# Patient Record
Sex: Female | Born: 1960 | Race: White | Hispanic: No | Smoking: Current every day smoker
Health system: Southern US, Community
[De-identification: ages and names within clinical notes are randomized; demographics above are authoritative.]

## PROBLEM LIST (undated history)

## (undated) DIAGNOSIS — K219 Gastro-esophageal reflux disease without esophagitis: Secondary | ICD-10-CM

## (undated) DIAGNOSIS — C3490 Malignant neoplasm of unspecified part of unspecified bronchus or lung: Secondary | ICD-10-CM

## (undated) DIAGNOSIS — F329 Major depressive disorder, single episode, unspecified: Secondary | ICD-10-CM

## (undated) DIAGNOSIS — D229 Melanocytic nevi, unspecified: Secondary | ICD-10-CM

## (undated) DIAGNOSIS — T8859XA Other complications of anesthesia, initial encounter: Secondary | ICD-10-CM

## (undated) DIAGNOSIS — E785 Hyperlipidemia, unspecified: Secondary | ICD-10-CM

## (undated) DIAGNOSIS — M1990 Unspecified osteoarthritis, unspecified site: Secondary | ICD-10-CM

## (undated) DIAGNOSIS — R112 Nausea with vomiting, unspecified: Secondary | ICD-10-CM

## (undated) DIAGNOSIS — I10 Essential (primary) hypertension: Secondary | ICD-10-CM

## (undated) DIAGNOSIS — T4145XA Adverse effect of unspecified anesthetic, initial encounter: Secondary | ICD-10-CM

## (undated) DIAGNOSIS — Z9889 Other specified postprocedural states: Secondary | ICD-10-CM

## (undated) DIAGNOSIS — K625 Hemorrhage of anus and rectum: Secondary | ICD-10-CM

## (undated) DIAGNOSIS — F40298 Other specified phobia: Secondary | ICD-10-CM

## (undated) DIAGNOSIS — F32A Depression, unspecified: Secondary | ICD-10-CM

## (undated) DIAGNOSIS — Q786 Multiple congenital exostoses: Secondary | ICD-10-CM

## (undated) HISTORY — DX: Multiple congenital exostoses: Q78.6

## (undated) HISTORY — DX: Gastro-esophageal reflux disease without esophagitis: K21.9

## (undated) HISTORY — PX: OTHER SURGICAL HISTORY: SHX169

## (undated) HISTORY — DX: Melanocytic nevi, unspecified: D22.9

## (undated) HISTORY — PX: PORTA CATH INSERTION: CATH118285

## (undated) HISTORY — DX: Hyperlipidemia, unspecified: E78.5

## (undated) HISTORY — PX: CHOLECYSTECTOMY: SHX55

## (undated) HISTORY — DX: Depression, unspecified: F32.A

## (undated) HISTORY — DX: Major depressive disorder, single episode, unspecified: F32.9

## (undated) HISTORY — DX: Hemorrhage of anus and rectum: K62.5

## (undated) HISTORY — DX: Essential (primary) hypertension: I10

## (undated) HISTORY — DX: Other specified phobia: F40.298

## (undated) HISTORY — DX: Malignant neoplasm of unspecified part of unspecified bronchus or lung: C34.90

---

## 1998-11-03 ENCOUNTER — Emergency Department (HOSPITAL_COMMUNITY): Admission: EM | Admit: 1998-11-03 | Discharge: 1998-11-03 | Payer: Self-pay | Admitting: Emergency Medicine

## 1998-11-17 ENCOUNTER — Encounter: Payer: Self-pay | Admitting: Emergency Medicine

## 1998-11-17 ENCOUNTER — Emergency Department (HOSPITAL_COMMUNITY): Admission: EM | Admit: 1998-11-17 | Discharge: 1998-11-17 | Payer: Self-pay | Admitting: Emergency Medicine

## 1998-12-31 ENCOUNTER — Emergency Department (HOSPITAL_COMMUNITY): Admission: EM | Admit: 1998-12-31 | Discharge: 1998-12-31 | Payer: Self-pay | Admitting: Emergency Medicine

## 2000-10-07 ENCOUNTER — Emergency Department (HOSPITAL_COMMUNITY): Admission: EM | Admit: 2000-10-07 | Discharge: 2000-10-07 | Payer: Self-pay | Admitting: Emergency Medicine

## 2000-11-24 ENCOUNTER — Ambulatory Visit (HOSPITAL_COMMUNITY): Admission: RE | Admit: 2000-11-24 | Discharge: 2000-11-24 | Payer: Self-pay | Admitting: Family Medicine

## 2000-11-24 ENCOUNTER — Encounter: Payer: Self-pay | Admitting: Family Medicine

## 2002-02-04 ENCOUNTER — Ambulatory Visit (HOSPITAL_COMMUNITY): Admission: RE | Admit: 2002-02-04 | Discharge: 2002-02-04 | Payer: Self-pay | Admitting: Family Medicine

## 2002-02-22 ENCOUNTER — Encounter: Payer: Self-pay | Admitting: Family Medicine

## 2002-02-22 ENCOUNTER — Encounter: Admission: RE | Admit: 2002-02-22 | Discharge: 2002-02-22 | Payer: Self-pay | Admitting: Family Medicine

## 2002-09-16 ENCOUNTER — Emergency Department (HOSPITAL_COMMUNITY): Admission: EM | Admit: 2002-09-16 | Discharge: 2002-09-16 | Payer: Self-pay | Admitting: Emergency Medicine

## 2002-10-20 ENCOUNTER — Encounter: Payer: Self-pay | Admitting: Internal Medicine

## 2002-10-20 ENCOUNTER — Ambulatory Visit (HOSPITAL_COMMUNITY): Admission: RE | Admit: 2002-10-20 | Discharge: 2002-10-20 | Payer: Self-pay | Admitting: Internal Medicine

## 2007-03-09 ENCOUNTER — Emergency Department (HOSPITAL_COMMUNITY): Admission: EM | Admit: 2007-03-09 | Discharge: 2007-03-09 | Payer: Self-pay | Admitting: Emergency Medicine

## 2008-04-21 ENCOUNTER — Ambulatory Visit: Payer: Self-pay | Admitting: Family Medicine

## 2008-04-27 ENCOUNTER — Ambulatory Visit: Payer: Self-pay | Admitting: *Deleted

## 2008-05-18 ENCOUNTER — Ambulatory Visit: Payer: Self-pay | Admitting: Family Medicine

## 2008-05-18 LAB — CONVERTED CEMR LAB
Albumin: 4.9 g/dL (ref 3.5–5.2)
Amphetamine Screen, Ur: NEGATIVE
Benzodiazepines.: NEGATIVE
CO2: 22 meq/L (ref 19–32)
Calcium: 9.8 mg/dL (ref 8.4–10.5)
Chloride: 101 meq/L (ref 96–112)
Cholesterol: 232 mg/dL — ABNORMAL HIGH (ref 0–200)
Cocaine Metabolites: NEGATIVE
Creatinine,U: 104.4 mg/dL
Eosinophils Relative: 2 % (ref 0–5)
Glucose, Bld: 89 mg/dL (ref 70–99)
HCT: 50.3 % — ABNORMAL HIGH (ref 36.0–46.0)
Lymphocytes Relative: 18 % (ref 12–46)
Lymphs Abs: 1.9 10*3/uL (ref 0.7–4.0)
Neutrophils Relative %: 76 % (ref 43–77)
Platelets: 200 10*3/uL (ref 150–400)
Potassium: 4.2 meq/L (ref 3.5–5.3)
Sodium: 137 meq/L (ref 135–145)
Total Protein: 7.8 g/dL (ref 6.0–8.3)
Triglycerides: 202 mg/dL — ABNORMAL HIGH (ref <150)
WBC: 10.9 10*3/uL — ABNORMAL HIGH (ref 4.0–10.5)

## 2008-06-02 ENCOUNTER — Ambulatory Visit: Payer: Self-pay | Admitting: Family Medicine

## 2008-06-02 ENCOUNTER — Encounter: Payer: Self-pay | Admitting: Family Medicine

## 2008-06-02 LAB — CONVERTED CEMR LAB
Chlamydia, DNA Probe: NEGATIVE
GC Probe Amp, Genital: NEGATIVE

## 2008-06-22 ENCOUNTER — Ambulatory Visit (HOSPITAL_COMMUNITY): Admission: RE | Admit: 2008-06-22 | Discharge: 2008-06-22 | Payer: Self-pay | Admitting: Internal Medicine

## 2008-08-08 ENCOUNTER — Encounter (INDEPENDENT_AMBULATORY_CARE_PROVIDER_SITE_OTHER): Payer: Self-pay | Admitting: General Surgery

## 2008-08-08 ENCOUNTER — Ambulatory Visit (HOSPITAL_COMMUNITY): Admission: RE | Admit: 2008-08-08 | Discharge: 2008-08-09 | Payer: Self-pay | Admitting: General Surgery

## 2010-11-04 LAB — COMPREHENSIVE METABOLIC PANEL
ALT: 55 U/L — ABNORMAL HIGH (ref 0–35)
AST: 30 U/L (ref 0–37)
Albumin: 4.3 g/dL (ref 3.5–5.2)
CO2: 26 mEq/L (ref 19–32)
Calcium: 9.4 mg/dL (ref 8.4–10.5)
Chloride: 102 mEq/L (ref 96–112)
GFR calc Af Amer: >60 mL/min (ref >60)
GFR calc non Af Amer: >60 mL/min (ref >60)
Sodium: 137 mEq/L (ref 135–145)

## 2010-11-04 LAB — DIFFERENTIAL
Eosinophils Absolute: 0.1 10*3/uL (ref 0.0–0.7)
Eosinophils Relative: 2 % (ref 0–5)
Lymphocytes Relative: 20 % (ref 12–46)
Lymphs Abs: 1.9 10*3/uL (ref 0.7–4.0)
Monocytes Absolute: 0.4 10*3/uL (ref 0.1–1.0)
Monocytes Relative: 4 % (ref 3–12)

## 2010-11-04 LAB — CBC
MCHC: 33.7 g/dL (ref 30.0–36.0)
Platelets: 154 10*3/uL (ref 150–400)
RBC: 5.03 MIL/uL (ref 3.87–5.11)
WBC: 9.7 10*3/uL (ref 4.0–10.5)

## 2010-12-03 NOTE — Op Note (Signed)
Dana Roth, Dana Roth               ACCOUNT NO.:  [id number]   MEDICAL RECORD NO.:  [id number]          PATIENT TYPE:  OIB   LOCATION:  0098                         FACILITY:  [facility]   PHYSICIAN:  Ollen Gross. Vernell Morgans, M.D. DATE OF BIRTH:  1961-07-14   DATE OF PROCEDURE:  08/08/2008  DATE OF DISCHARGE:                               OPERATIVE REPORT   PREOPERATIVE DIAGNOSIS:  Gallstones.   POSTOPERATIVE DIAGNOSES:  Gallstones.   PROCEDURES:  Laparoscopic cholecystectomy with intraoperative  cholangiogram.   SURGEON:  Ollen Gross. Vernell Morgans, M.D.   ASSISTANT:  Anselm Pancoast. Zachery Dakins, M.D.   ANESTHESIA:  General endotracheal.   PROCEDURE:  After informed consent was obtained, the patient was brought  to the operating room and placed in supine position on the operative  table.  After induction of general anesthesia the patient's abdomen was  prepped with Betadine and draped in usual sterile manner.  The area  below the umbilicus was infiltrated 0.25% Marcaine.  A small incision  was made with a 15 blade knife.  This incision was carried down through  the subcutaneous tissue bluntly with the hemostat and [employer]  retractors until the linea alba was identified.  The linea alba was  incised with a 15 blade knife and each side was grasped Kocher clamps,  elevated anteriorly.  The preperitoneal space was then probed bluntly  with a hemostat until the peritoneum was opened. Access was gained to  the abdominal cavity.  A zero Vicryl pursestring stitch was placed in  the fascia around the opening.  Hasson cannula was placed through the  opening and anchored in place to the previously placed Vicryl  pursestring stitch.  The abdomen was then insufflated with carbon  dioxide without difficulty.  The patient was placed in reverse  Trendelenburg position, rotated with the right side up.  Next the  epigastric region was infiltrated with 0.25% Marcaine.  A small incision  was made with a 15 blade knife and  a 10 mm port was placed bluntly  through this incision into the abdominal cavity under direct vision.  Sites were then chosen laterally on the right side of the abdomen for  placement of the 5 mm ports.  Each of these areas were infiltrated with  0.25% Marcaine.  Small stab incisions were made with a 15 blade knife  and 5 mm ports were placed bluntly through these incisions into the  abdominal cavity under direct vision. A blunt grasper was placed through  the lateral-most 5-mm port and used to grasp the dome of the gallbladder  and elevated anteriorly and superiorly.  Another blunt grasper was  placed through the other 5-mm port and used to retract the body and neck  of the gallbladder.  A dissector was placed through the epigastric port  and using electrocautery the peritoneal reflection was opened at the  area of the gallbladder neck.  Blunt dissection was then carried out in  this area until the gallbladder neck cystic duct junction was readily  identified and a good window was created.  A single clip was placed on  the gallbladder neck.  A small ductotomy was made just below the clip.  A 14 gauge Angiocath was placed percutaneously through the anterior  abdominal wall under direct vision.  A Reddick cholangiogram catheter  placed through the Angiocath and flushed.  The Reddick catheter was then  placed in the cystic duct and anchored in place the clip.  Cholangiogram  was obtained that showed no filling defects, good emptying in the  duodenum and adequate length on the cystic duct.  The anchoring clip and  catheters removed from the patient.  Three clips placed proximal cystic  duct and duct was divided between the two sets of clips.  Posterior to  this the cystic artery was identified and again dissected bluntly in  circumferential manner until a good window was created.  Two clips were  placed proximally and one distally on the artery and the artery was  divided between the 2.  Next  a laparoscopic hook cautery device was used  to separate the gallbladder from the liver bed.  There were some small  branches of the artery along the liver bed that were also controlled  with clips.  Prior to completely detaching the gallbladder from liver  bed, the liver bed was inspected and several small bleeding points  coagulated with electrocautery until the area was completely hemostatic.  The gallbladder was then detached the rest of the way from the liver bed  without difficulty.  A laparoscopic bag was inserted through the  epigastric port.  The gallbladder was placed in the bag and the bag was  sealed.  The abdomen was then irrigated with copious amounts of saline  until the effluent was clear.  The liver bed was inspected again and  found to be hemostatic.  The fascial defect at the infraumbilical  incision was then closed with the previous placed Vicryl pursestring.  The laparoscope was then moved to the epigastric port.  The gallbladder  grasper was placed through the Hasson cannula and used to grasp the open  end of the bag.  The bag with the gallbladder was removed with the  Hasson cannula through the infraumbilical port without difficulty.  The  fascial defect of the infraumbilical port was then closed with the  previously placed Vicryl pursestring stitch as well as with another  figure-of-eight 0 Vicryl stitch.  The rest of the ports were removed  under direct vision and were found to be hemostatic.  The gas was  allowed to escape.  The skin incisions were all closed with interrupted  4-0 Monocryl subcuticular stitches and Dermabond dressings were applied.  The patient tolerated the procedure well.  At the end of the case all  needle, sponge and instrument counts were correct.  The patient was  awakened, taken to recovery room in stable condition.      Ollen Gross. Vernell Morgans, M.D.  Electronically Signed     PST/MEDQ  D:  08/08/2008  T:  08/08/2008  Job:  [id number]

## 2011-05-02 LAB — COMPREHENSIVE METABOLIC PANEL
ALT: 24
Alkaline Phosphatase: 84
BUN: 9
Chloride: 106
Glucose, Bld: 122 — ABNORMAL HIGH
Potassium: 3.6
Sodium: 139
Total Bilirubin: 1.1
Total Protein: 7.4

## 2011-05-02 LAB — DIFFERENTIAL
Basophils Absolute: 0.5 — ABNORMAL HIGH
Basophils Relative: 4 — ABNORMAL HIGH
Eosinophils Absolute: 0.1
Monocytes Absolute: 0.4
Monocytes Relative: 3
Neutro Abs: 12.2 — ABNORMAL HIGH
Neutrophils Relative %: 86 — ABNORMAL HIGH

## 2011-05-02 LAB — URINE MICROSCOPIC-ADD ON

## 2011-05-02 LAB — URINALYSIS, ROUTINE W REFLEX MICROSCOPIC
Bilirubin Urine: NEGATIVE
Glucose, UA: NEGATIVE
Ketones, ur: 15 — AB
Specific Gravity, Urine: 1.026
pH: 6

## 2011-05-02 LAB — CBC
HCT: 45.7
Hemoglobin: 15.6 — ABNORMAL HIGH
RBC: 5.21 — ABNORMAL HIGH
RDW: 12
WBC: 14.1 — ABNORMAL HIGH

## 2011-05-02 LAB — PREGNANCY, URINE: Preg Test, Ur: NEGATIVE

## 2012-12-10 ENCOUNTER — Ambulatory Visit: Payer: Self-pay

## 2013-01-03 ENCOUNTER — Encounter: Payer: Self-pay | Admitting: Internal Medicine

## 2013-01-03 ENCOUNTER — Ambulatory Visit: Payer: No Typology Code available for payment source | Attending: Family Medicine | Admitting: Internal Medicine

## 2013-01-03 VITALS — BP 129/85 | HR 84 | Temp 98.1°F | Resp 16 | Ht <= 58 in | Wt 198.0 lb

## 2013-01-03 DIAGNOSIS — K625 Hemorrhage of anus and rectum: Secondary | ICD-10-CM

## 2013-01-03 DIAGNOSIS — I10 Essential (primary) hypertension: Secondary | ICD-10-CM | POA: Insufficient documentation

## 2013-01-03 DIAGNOSIS — F17200 Nicotine dependence, unspecified, uncomplicated: Secondary | ICD-10-CM

## 2013-01-03 DIAGNOSIS — D1600 Benign neoplasm of scapula and long bones of unspecified upper limb: Secondary | ICD-10-CM | POA: Insufficient documentation

## 2013-01-03 DIAGNOSIS — K219 Gastro-esophageal reflux disease without esophagitis: Secondary | ICD-10-CM

## 2013-01-03 LAB — COMPREHENSIVE METABOLIC PANEL
ALT: 24 U/L (ref 0–35)
AST: 19 U/L (ref 0–37)
Alkaline Phosphatase: 86 U/L (ref 39–117)
Creat: 0.78 mg/dL (ref 0.50–1.10)
Sodium: 138 mEq/L (ref 135–145)
Total Bilirubin: 0.5 mg/dL (ref 0.3–1.2)
Total Protein: 7.7 g/dL (ref 6.0–8.3)

## 2013-01-03 LAB — ANEMIA PANEL
%SAT: 37 % (ref 20–55)
Folate: 13.1 ng/mL
Iron: 110 ug/dL (ref 42–145)
TIBC: 301 ug/dL (ref 250–470)
UIBC: 191 ug/dL (ref 125–400)
Vitamin B-12: 887 pg/mL (ref 211–911)

## 2013-01-03 LAB — CBC
MCH: 29 pg (ref 26.0–34.0)
MCV: 85.5 fL (ref 78.0–100.0)
Platelets: 184 10*3/uL (ref 150–400)
RDW: 13.6 % (ref 11.5–15.5)
WBC: 8 10*3/uL (ref 4.0–10.5)

## 2013-01-03 LAB — LIPID PANEL
LDL Cholesterol: 152 mg/dL — ABNORMAL HIGH (ref 0–99)
Triglycerides: 203 mg/dL — ABNORMAL HIGH (ref <150)
VLDL: 41 mg/dL — ABNORMAL HIGH (ref 0–40)

## 2013-01-03 LAB — TSH: TSH: 3.427 u[IU]/mL (ref 0.350–4.500)

## 2013-01-03 MED ORDER — METOPROLOL TARTRATE 25 MG PO TABS: 12.5000 mg | ORAL_TABLET | Freq: Two times a day (BID) | ORAL | Status: DC

## 2013-01-03 MED ORDER — ASPIRIN EC 81 MG PO TBEC: 81.0000 mg | DELAYED_RELEASE_TABLET | Freq: Every day | ORAL | Status: DC

## 2013-01-03 MED ORDER — PANTOPRAZOLE SODIUM 40 MG PO TBEC: 40.0000 mg | DELAYED_RELEASE_TABLET | Freq: Every day | ORAL | Status: DC

## 2013-01-03 NOTE — Progress Notes (Signed)
Patient ID: Dana Roth, female   DOB: April 12, 1961, 52 y.o.   MRN: [id number] Patient Demographics  Dana Roth, is a 52 y.o. female  WUJ:[id number]  [id number]  DOB - 1961-05-21  Chief Complaint  Patient presents with  . 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 Some intermittent left elbow pain, Currently symptom-free.  Objective:   No past medical history on file.    No past surgical history on file.   Filed Vitals:   01/03/13 0913  BP: 129/85  Pulse: 84  Temp: 98.1 F (36.7 C)  Resp: 16  Weight: 198 lb (89.812 kg)  SpO2: 99%     Exam  Awake Alert, Oriented X 3, No new F.N deficits, Normal affect .AT,PERRAL Supple Neck,No JVD, No cervical lymphadenopathy appriciated.  Symmetrical Chest wall movement, Good air movement bilaterally, CTAB RRR,No Gallops,Rubs or new Murmurs, No Parasternal Heave +ve B.Sounds, Abd Soft, Non tender, No organomegaly appriciated, No rebound - guarding or rigidity. No Cyanosis, Clubbing or edema, No new Rash or bruise       Data Review   CBC No results found for this basename: WBC, HGB, HCT, PLT, MCV, MCH, MCHC, RDW, NEUTRABS, LYMPHSABS, MONOABS, EOSABS, BASOSABS, BANDABS, BANDSABD,  in the last 168 hours  Chemistries   No results found for this basename: NA, K, CL, CO2, GLUCOSE, BUN, CREATININE, GFRCGP, CALCIUM, MG, AST, ALT, ALKPHOS, BILITOT,  in the last 168  hours ------------------------------------------------------------------------------------------------------------------ No results found for this basename: HGBA1C,  in the last 72 hours ------------------------------------------------------------------------------------------------------------------ No results found for this basename: CHOL, HDL, LDLCALC, TRIG, CHOLHDL, LDLDIRECT,  in the last 72 hours ------------------------------------------------------------------------------------------------------------------ No results found for this basename: TSH, T4TOTAL, FREET3, T3FREE, THYROIDAB,  in the last 72 hours ------------------------------------------------------------------------------------------------------------------ No results found for this basename: VITAMINB12, FOLATE, FERRITIN, TIBC, IRON, RETICCTPCT,  in the last 72 hours  Coagulation profile  No results found for this basename: INR, PROTIME,  in the last 168 hours     Prior to Admission medications   Medication Sig Start Date End Date Taking? Authorizing Provider  aspirin EC 81 MG tablet Take 1 tablet (81 mg total) by mouth daily. 01/03/13   Leroy Sea, MD  metoprolol tartrate (LOPRESSOR) 25 MG tablet Take 0.5 tablets (12.5 mg total) by mouth 2 (two) times daily. 01/03/13   Leroy Sea, MD  pantoprazole (PROTONIX) 40 MG tablet Take 1 tablet (40 mg total) by mouth daily. 01/03/13   Leroy Sea, MD     Assessment & Plan   Multiple benign bone tumors status post resection. Now some right elbow pain.- Likely due to her history of osteochondroma, will obtain x-ray of her right elbow, will also refer her to orthopedics to see if the splint would help and to ascertain right diagnosis of pain.   Hemorrhoidal rectal bleed, some nonspecific left lower quadrant abdominal pain- she does have external hemorrhoids on exam, no active bleeding at this time, she is been  advised to continue using preparation H. which she does,  will also refer her to GI as she's never had a colonoscopy, also has some intermittent left lower quadrant abdominal pain.   Atypical intermittent chest pain- chest pain-free right now, does have history of hypertension, smoking, family history of CAD, baseline EKG no acute changes, have placed her on aspirin, low-dose beta blocker, have referred her to cardiology for outpatient testing. We'll also place her on a PPI in case this is GERD related however it sounds more musculoskeletal.   Hypertension placed on low dose Lopressor we will monitor.   Smoking history-consult to quit smoking   She is here to establish care, baseline CBC, anemia panel, BMP, A1c, TSH, lipid panel ordered. Patient requested to come back in a week to followup on her test results.    Leroy Sea M.D on 01/03/2013 at 9:33 AM

## 2013-01-03 NOTE — Progress Notes (Signed)
Patient states has not been to a doctor iin about 4 years Here for rectal bleeding Pain near her sternum Bilateral elbow pain

## 2013-01-06 NOTE — Progress Notes (Signed)
Quick Note:  Please have patient comeback in 2-3 weeks for high cholesterol management ______

## 2013-01-11 ENCOUNTER — Telehealth: Payer: Self-pay | Admitting: *Deleted

## 2013-01-11 NOTE — Telephone Encounter (Signed)
01/11/13 Patient made aware to come back in 2-3 weeks for high cholesterol management. P.Mael Delap,RN BSN MHA

## 2013-01-17 ENCOUNTER — Ambulatory Visit: Payer: No Typology Code available for payment source | Attending: Family Medicine | Admitting: Family Medicine

## 2013-01-17 VITALS — BP 131/86 | HR 76 | Temp 98.4°F | Ht 64.0 in | Wt 177.6 lb

## 2013-01-17 DIAGNOSIS — I10 Essential (primary) hypertension: Secondary | ICD-10-CM

## 2013-01-17 DIAGNOSIS — F329 Major depressive disorder, single episode, unspecified: Secondary | ICD-10-CM

## 2013-01-17 DIAGNOSIS — D229 Melanocytic nevi, unspecified: Secondary | ICD-10-CM

## 2013-01-17 DIAGNOSIS — E785 Hyperlipidemia, unspecified: Secondary | ICD-10-CM

## 2013-01-17 DIAGNOSIS — D492 Neoplasm of unspecified behavior of bone, soft tissue, and skin: Secondary | ICD-10-CM

## 2013-01-17 DIAGNOSIS — F40298 Other specified phobia: Secondary | ICD-10-CM

## 2013-01-17 NOTE — Progress Notes (Signed)
CSW met with patient who was experiencing low mood and suicide ideation, but no active suicide plan.  Patient was willing to be referred to counseling but presented as resistant to support from this CSW.  CSW gave patient contact information for [facility].  Patient stated that she preferred walk in for treatment rather than have an initial assessment.    CSW can follow as needed.  Beverly Sessions MSW, LCSW [phone] Duration: 20 min

## 2013-01-17 NOTE — Patient Instructions (Signed)
Cholesterol Cholesterol is a white, waxy, fat-like protein needed by your body in small amounts. The liver makes all the cholesterol you need. It is carried from the liver by the blood through the blood vessels. Deposits (plaque) may build up on blood vessel walls. This makes the arteries narrower and stiffer. Plaque increases the risk for heart attack and stroke. You cannot feel your cholesterol level even if it is very high. The only way to know is by a blood test to check your lipid (fats) levels. Once you know your cholesterol levels, you should keep a record of the test results. Work with your caregiver to to keep your levels in the desired range. WHAT THE RESULTS MEAN:  Total cholesterol is a rough measure of all the cholesterol in your blood.  LDL is the so-called bad cholesterol. This is the type that deposits cholesterol in the walls of the arteries. You want this level to be low.  HDL is the good cholesterol because it cleans the arteries and carries the LDL away. You want this level to be high.  Triglycerides are fat that the body can either burn for energy or store. High levels are closely linked to heart disease. DESIRED LEVELS:  Total cholesterol below 200.  LDL below 100 for people at risk, below 70 for very high risk.  HDL above 50 is good, above 60 is best.  Triglycerides below 150. HOW TO LOWER YOUR CHOLESTEROL:  Diet.  Choose fish or white meat chicken and [place], roasted or baked. Limit fatty cuts of red meat, fried foods, and processed meats, such as sausage and lunch meat.  Eat lots of fresh fruits and vegetables. Choose whole grains, beans, pasta, potatoes and cereals.  Use only small amounts of olive, corn or canola oils. Avoid butter, mayonnaise, shortening or palm kernel oils. Avoid foods with trans-fats.  Use skim/nonfat milk and low-fat/nonfat yogurt and cheeses. Avoid whole milk, cream, ice cream, egg yolks and cheeses. Healthy desserts include angel food  cake, ginger snaps, animal crackers, hard candy, popsicles, and low-fat/nonfat frozen yogurt. Avoid pastries, cakes, pies and cookies.  Exercise.  A regular program helps decrease LDL and raises HDL.  Helps with weight control.  Do things that increase your activity level like gardening, walking, or taking the stairs.  Medication.  May be prescribed by your caregiver to help lowering cholesterol and the risk for heart disease.  You may need medicine even if your levels are normal if you have several risk factors. HOME CARE INSTRUCTIONS   Follow your diet and exercise programs as suggested by your caregiver.  Take medications as directed.  Have blood work done when your caregiver feels it is necessary. MAKE SURE YOU:   Understand these instructions.  Will watch your condition.  Will get help right away if you are not doing well or get worse. Document Released: 04/01/2001 Document Revised: 09/29/2011 Document Reviewed: 09/22/2007 [facility] Patient Information 2014 [employer], [place]. Smoking Cessation, Tips for Success YOU CAN QUIT SMOKING If you are ready to quit smoking, congratulations! You have chosen to help yourself be healthier. Cigarettes bring nicotine, tar, carbon monoxide, and other irritants into your body. Your lungs, heart, and blood vessels will be able to work better without these poisons. There are many different ways to quit smoking. Nicotine gum, nicotine patches, a nicotine inhaler, or nicotine nasal spray can help with physical craving. Hypnosis, support groups, and medicines help break the habit of smoking. Here are some tips to help you quit for  good.  Throw away all cigarettes.  Clean and remove all ashtrays from your home, work, and car.  On a card, write down your reasons for quitting. Carry the card with you and read it when you get the urge to smoke.  Cleanse your body of nicotine. Drink enough water and fluids to keep your urine clear or pale yellow. Do  this after quitting to flush the nicotine from your body.  Learn to predict your moods. Do not let a bad situation be your excuse to have a cigarette. Some situations in your life might tempt you into wanting a cigarette.  Never have "just one" cigarette. It leads to wanting another and another. Remind yourself of your decision to quit.  Change habits associated with smoking. If you smoked while driving or when feeling stressed, try other activities to replace smoking. Stand up when drinking your coffee. Brush your teeth after eating. Sit in a different chair when you read the paper. Avoid alcohol while trying to quit, and try to drink fewer caffeinated beverages. Alcohol and caffeine may urge you to smoke.  Avoid foods and drinks that can trigger a desire to smoke, such as sugary or spicy foods and alcohol.  Ask people who smoke not to smoke around you.  Have something planned to do right after eating or having a cup of coffee. Take a walk or exercise to perk you up. This will help to keep you from overeating.  Try a relaxation exercise to calm you down and decrease your stress. Remember, you may be tense and nervous for the first 2 weeks after you quit, but this will pass.  Find new activities to keep your hands busy. Play with a pen, coin, or rubber band. Doodle or draw things on paper.  Brush your teeth right after eating. This will help cut down on the craving for the taste of tobacco after meals. You can try mouthwash, too.  Use oral substitutes, such as lemon drops, carrots, a cinnamon stick, or chewing gum, in place of cigarettes. Keep them handy so they are available when you have the urge to smoke.  When you have the urge to smoke, try deep breathing.  Designate your home as a nonsmoking area.  If you are a heavy smoker, ask your caregiver about a prescription for nicotine chewing gum. It can ease your withdrawal from nicotine.  Reward yourself. Set aside the cigarette money you  save and buy yourself something nice.  Look for support from others. Join a support group or smoking cessation program. Ask someone at home or at work to help you with your plan to quit smoking.  Always ask yourself, "Do I need this cigarette or is this just a reflex?" Tell yourself, "Today, I choose not to smoke," or "I do not want to smoke." You are reminding yourself of your decision to quit, even if you do smoke a cigarette. HOW WILL I FEEL WHEN I QUIT SMOKING?  The benefits of not smoking start within days of quitting.  You may have symptoms of withdrawal because your body is used to nicotine (the addictive substance in cigarettes). You may crave cigarettes, be irritable, feel very hungry, cough often, get headaches, or have difficulty concentrating.  The withdrawal symptoms are only temporary. They are strongest when you first quit but will go away within 10 to 14 days.  When withdrawal symptoms occur, stay in control. Think about your reasons for quitting. Remind yourself that these are signs that your  body is healing and getting used to being without cigarettes.  Remember that withdrawal symptoms are easier to treat than the major diseases that smoking can cause.  Even after the withdrawal is over, expect periodic urges to smoke. However, these cravings are generally short-lived and will go away whether you smoke or not. Do not smoke!  If you relapse and smoke again, do not lose hope. Most smokers quit 3 times before they are successful.  If you relapse, do not give up! Plan ahead and think about what you will do the next time you get the urge to smoke. LIFE AS A NONSMOKER: MAKE IT FOR A MONTH, MAKE IT FOR LIFE Day 1: Hang this page where you will see it every day. Day 2: Get rid of all ashtrays, matches, and lighters. Day 3: Drink water. Breathe deeply between sips. Day 4: Avoid places with smoke-filled air, such as bars, clubs, or the smoking section of restaurants. Day 5: Keep  track of how much money you save by not smoking. Day 6: Avoid boredom. Keep a good book with you or go to the movies. Day 7: Reward yourself! One week without smoking! Day 8: Make a dental appointment to get your teeth cleaned. Day 9: Decide how you will turn down a cigarette before it is offered to you. Day 10: Review your reasons for quitting. Day 11: Distract yourself. Stay active to keep your mind off smoking and to relieve tension. Take a walk, exercise, read a book, do a crossword puzzle, or try a new hobby. Day 12: Exercise. Get off the bus before your stop or use stairs instead of escalators. Day 13: Call on friends for support and encouragement. Day 14: Reward yourself! Two weeks without smoking! Day 15: Practice deep breathing exercises. Day 16: Bet a friend that you can stay a nonsmoker. Day 17: Ask to sit in nonsmoking sections of restaurants. Day 18: Hang up "No Smoking" signs. Day 19: Think of yourself as a nonsmoker. Day 20: Each morning, tell yourself you will not smoke. Day 21: Reward yourself! Three weeks without smoking! Day 22: Think of smoking in negative ways. Remember how it stains your teeth, gives you bad breath, and leaves you short of breath. Day 23: Eat a nutritious breakfast. Day 24:Do not relive your days as a smoker. Day 25: Hold a pencil in your hand when talking on the telephone. Day 26: Tell all your friends you do not smoke. Day 27: Think about how much better food tastes. Day 28: Remember, one cigarette is one too many. Day 29: Take up a hobby that will keep your hands busy. Day 30: Congratulations! One month without smoking! Give yourself a big reward. Your caregiver can direct you to community resources or hospitals for support, which may include:  Group support.  Education.  Hypnosis.  Subliminal therapy. Document Released: 04/04/2004 Document Revised: 09/29/2011 Document Reviewed: 04/23/2009 [facility] Patient Information 2014 [place],  [place].

## 2013-01-17 NOTE — Progress Notes (Signed)
Patient ID: Dana Roth, female   DOB: 1960-08-31, 52 y.o.   MRN: [id number]  CC: Followup   HPI: Patient is presenting today to followup on her cholesterol results.  She reports that she has been eating a poor diet.  She reports that she normally consumes a high fat diet.  She is hamburger's on a daily basis.  She reports that she has been trying to cut down on the sodium content of her meals.  She also reports that she is willing to try [place] burgers.  She also has been having difficulty with depression.  She reports no history of suicidal behaviors.  She has no current suicidal thoughts or plans.  She reports that she does get tired and depressed at times.  She is willing to consider counseling.  Allergies  Allergen Reactions  . Codeine    No past medical history on file. Current Outpatient Prescriptions on File Prior to Visit  Medication Sig Dispense Refill  . metoprolol tartrate (LOPRESSOR) 25 MG tablet Take 0.5 tablets (12.5 mg total) by mouth 2 (two) times daily.  30 tablet  1  . pantoprazole (PROTONIX) 40 MG tablet Take 1 tablet (40 mg total) by mouth daily.  30 tablet  1  . aspirin EC 81 MG tablet Take 1 tablet (81 mg total) by mouth daily.  30 tablet  1   No current facility-administered medications on file prior to visit.   No family history on file. History   Social History  . Marital Status: Divorced    Spouse Name: N/A    Number of Children: N/A  . Years of Education: N/A   Occupational History  . Not on file.   Social History Main Topics  . Smoking status: Current Every Day Smoker  . Smokeless tobacco: Not on file  . Alcohol Use: Not on file  . Drug Use: Not on file  . Sexually Active: Not on file   Other Topics Concern  . Not on file   Social History Narrative  . No narrative on file    Review of Systems  Constitutional: Negative for fever, chills, diaphoresis, activity change, appetite change and fatigue.  HENT: Negative for ear pain, nosebleeds,  congestion, facial swelling, rhinorrhea, neck pain, neck stiffness and ear discharge.   Eyes: Negative for pain, discharge, redness, itching and visual disturbance.  Respiratory: Negative for cough, choking, chest tightness, shortness of breath, wheezing and stridor.   Cardiovascular: Negative for chest pain, palpitations and leg swelling.  Gastrointestinal: Negative for abdominal distention.  Genitourinary: Negative for dysuria, urgency, frequency, hematuria, flank pain, decreased urine volume, difficulty urinating and dyspareunia.  Musculoskeletal: Negative for back pain, joint swelling, arthralgias and gait problem.  Neurological: Negative for dizziness, tremors, seizures, syncope, facial asymmetry, speech difficulty, weakness, light-headedness, numbness and headaches.  Hematological: Negative for adenopathy. Does not bruise/bleed easily.  Psychiatric/Behavioral: Negative for hallucinations, dysphoric mood present and depression  Objective:   Filed Vitals:   01/17/13 1122  BP: 131/86  Pulse: 76  Temp: 98.4 F (36.9 C)    Physical Exam  Constitutional: Appears well-developed and well-nourished. No distress.  HENT: Normocephalic. External right and left ear normal. Oropharynx is clear and moist.  Eyes: Conjunctivae and EOM are normal. PERRLA, no scleral icterus.  Neck: Normal ROM. Neck supple. No JVD. No tracheal deviation. No thyromegaly.  CVS: RRR, S1/S2 +, no murmurs, no gallops, no carotid bruit.  Pulmonary: Effort and breath sounds normal, no stridor, rhonchi, wheezes, rales.  Abdominal: Soft. BS +,  no distension, tenderness, rebound or guarding.  Musculoskeletal: Normal range of motion. No edema and no tenderness.  Lymphadenopathy: No lymphadenopathy noted, cervical, inguinal. Neuro: Alert. Normal reflexes, muscle tone coordination. No cranial nerve deficit. Skin: Skin is warm and dry. No rash noted. Not diaphoretic. No erythema. No pallor.  Psychiatric: Flat affect.  Behavior, judgment, thought content normal.   Lab Results  Component Value Date   WBC 8.0 01/03/2013   HGB 15.6* 01/03/2013   HCT 46.0 01/03/2013   MCV 85.5 01/03/2013   PLT 184 01/03/2013   Lab Results  Component Value Date   CREATININE 0.78 01/03/2013   BUN 10 01/03/2013   NA 138 01/03/2013   K 4.6 01/03/2013   CL 104 01/03/2013   CO2 25 01/03/2013    No results found for this basename: HGBA1C   Lipid Panel     Component Value Date/Time   CHOL 230* 01/03/2013 0922   TRIG 203* 01/03/2013 0922   HDL 37* 01/03/2013 0922   CHOLHDL 6.2 01/03/2013 0922   VLDL 41* 01/03/2013 0922   LDLCALC 152* 01/03/2013 0922       Assessment and plan:   Patient Active Problem List   Diagnosis Date Noted  . Smoker 01/03/2013  . Rectal bleeding 01/03/2013  . GERD (gastroesophageal reflux disease) 01/03/2013  . HTN (hypertension) 01/03/2013   Dyslipidemia  Trial of diet and exercise first, recheck lipids,  And start meds if needed  BP meds working well.   Depression - sent to speak with social worker and also gave information for [facility] and [facility], patient declined to seek further care but we did give her information on emergency contact information regarding mental health care in the community as a resource for her to have in the future.  Encouraged to stop smoking cigs and using all tobacco products  Follow up 2 months  The patient was given clear instructions to go to ER or return to medical center if symptoms don't improve, worsen or new problems develop.  The patient verbalized understanding.  The patient was told to call to get any lab results if not heard anything in the next week.    Rodney Langton, MD, CDE, FAAFP [facility] [place], [place]

## 2013-01-27 ENCOUNTER — Encounter: Payer: Self-pay | Admitting: Internal Medicine

## 2013-02-14 ENCOUNTER — Ambulatory Visit: Payer: No Typology Code available for payment source | Attending: Family Medicine

## 2013-02-14 NOTE — Progress Notes (Unsigned)
CSW met with patient in order to initiate mental health assessment. Patient identified that she desired to meet with this CSW for counseling and emotional support.  CSW began by assessing mental health using PHQ-SADS. Patient's scores indicated Major Depression, Generalized Anxiety and Panic Disorder.  CSW will seek physician support for medication needs and continue with counseling.  Patient scheduled follow up for 2 weeks from today.  Beverly Sessions MSW, LCSW 50 min duration

## 2013-02-24 ENCOUNTER — Encounter: Payer: Self-pay | Admitting: *Deleted

## 2013-02-24 ENCOUNTER — Ambulatory Visit (INDEPENDENT_AMBULATORY_CARE_PROVIDER_SITE_OTHER): Payer: Medicaid Other | Admitting: Cardiology

## 2013-02-24 VITALS — BP 125/93 | HR 78 | Wt 175.0 lb

## 2013-02-24 DIAGNOSIS — R079 Chest pain, unspecified: Secondary | ICD-10-CM

## 2013-02-24 DIAGNOSIS — F17200 Nicotine dependence, unspecified, uncomplicated: Secondary | ICD-10-CM

## 2013-02-24 DIAGNOSIS — I10 Essential (primary) hypertension: Secondary | ICD-10-CM

## 2013-02-24 NOTE — Assessment & Plan Note (Signed)
Continue present blood pressure medications. 

## 2013-02-24 NOTE — Assessment & Plan Note (Signed)
Patient symptoms are most likely musculoskeletal in etiology. I do not think they're consistent with cardiac etiology. However she does have multiple cardiac risk factors including family history. I will arrange an exercise treadmill risk stratification. If negative I would encourage her to begin an exercise program.

## 2013-02-24 NOTE — Patient Instructions (Addendum)
Your physician has requested that you have an exercise tolerance test. For further information please visit [email or web address] Please also follow instruction sheet, as given.    Exercise Stress Electrocardiography An exercise stress test is a heart test (EKG) which is done while you are moving. You will walk on a treadmill. This test will tell your doctor how your heart does when it is forced to work harder and how much activity you can safely handle. BEFORE THE TEST  Wear shorts or athletic pants.  Wear comfortable tennis shoes.  Women need to wear a bra that allows patches to be put on under it. TEST  An EKG cable will be attached to your waist. This cable is hooked up to patches, which look like round stickers stuck to your chest.  You will be asked to walk on the treadmill.  You will walk until you are too tired or until you are told to stop.  Tell the doctor right away if you have:  Chest pain.  Leg cramps.  Shortness of breath.  Dizziness.  The test may last 30 minutes to 1 hour. The timing depends on your physical condition and the condition of your heart. AFTER THE TEST  You will rest for about 6 minutes. During this time, your heart rhythm and blood pressure will be checked.  The testing equipment will be removed from your body and you can get dressed.  You may go home or back to your hospital room. You may keep doing all your usual activities as told by your doctor. Finding out the results of your test Ask when your test results will be ready. Make sure you get your test results. Document Released: 12/24/2007 Document Revised: 09/29/2011 Document Reviewed: 12/24/2007 [facility] Patient Information 2014 [place], [place].

## 2013-02-24 NOTE — Progress Notes (Signed)
  HPI: 52 year old female with no prior cardiac history for evaluation of chest pain. Patient describes pain under the left breast area. It radiates to her left upper extremity. It increases with wearing a bra and with certain movements. There is some diaphoresis but no dyspnea or nausea. The pain most recently was continuous for 4 days. There is some improvement with exertion. She denies dyspnea, orthopnea, PND or pedal edema. No syncope. She has a history of osteochondromas and there is one in the rib area and she wonders if this is the cause. She was placed on protonic for no improvement.  Current Outpatient Prescriptions  Medication Sig Dispense Refill  . aspirin EC 81 MG tablet Take 1 tablet (81 mg total) by mouth daily.  30 tablet  1  . metoprolol tartrate (LOPRESSOR) 25 MG tablet Take 0.5 tablets (12.5 mg total) by mouth 2 (two) times daily.  30 tablet  1  . oxyCODONE-acetaminophen (PERCOCET/ROXICET) 5-325 MG per tablet Take 1 tablet by mouth every 4 (four) hours as needed for pain.      . pantoprazole (PROTONIX) 40 MG tablet Take 1 tablet (40 mg total) by mouth daily.  30 tablet  1   No current facility-administered medications for this visit.    Allergies  Allergen Reactions  . Codeine     Past Medical History  Diagnosis Date  . Rectal bleeding   . GERD (gastroesophageal reflux disease)   . HTN (hypertension)   . Needle phobia   . Depression   . Dyslipidemia   . Suspicious mole   . Multiple osteochondroma     Multiple bone surgeries    Past Surgical History  Procedure Laterality Date  . Cholecystectomy      History   Social History  . Marital Status: Divorced    Spouse Name: N/A    Number of Children: N/A  . Years of Education: N/A   Occupational History  .      [employer]   Social History Main Topics  . Smoking status: Current Every Day Smoker  . Smokeless tobacco: Not on file  . Alcohol Use: No  . Drug Use: Yes    Special: Marijuana  .  Sexually Active: Not on file   Other Topics Concern  . Not on file   Social History Narrative  . No narrative on file    Family History  Problem Relation Age of Onset  . Heart disease Brother     "Hole in heart"  . CAD Sister     ROS: hematochezia and patient is scheduled for colonoscopy but no fevers or chills, productive cough, hemoptysis, dysphasia, odynophagia, melena, hematochezia, dysuria, hematuria, rash, seizure activity, orthopnea, PND, pedal edema, claudication. Remaining systems are negative.  Physical Exam:   Blood pressure 125/93, pulse 78, weight 175 lb (79.379 kg).  General:  Well developed/well nourished in NAD Skin warm/dry Patient not depressed No peripheral clubbing Back-normal HEENT-normal/normal eyelids Neck supple/normal carotid upstroke bilaterally; no bruits; no JVD; no thyromegaly chest - CTA/ normal expansion, chest pain reproduced with palpation. CV - RRR/normal S1 and S2; no murmurs, rubs or gallops;  PMI nondisplaced Abdomen -NT/ND, no HSM, no mass, + bowel sounds, no bruit 2+ femoral pulses, no bruits Ext-no edema, chords, 2+ DP Neuro-grossly nonfocal  ECG sinus rhythm at a rate of 78. No significant ST changes. RV conduction delay.

## 2013-02-24 NOTE — Assessment & Plan Note (Signed)
Patient counseled on discontinuing. 

## 2013-02-28 ENCOUNTER — Ambulatory Visit: Payer: Medicaid Other | Attending: Family Medicine

## 2013-02-28 NOTE — Progress Notes (Unsigned)
CSW met with patient for counseling and emotional support.  Patient stated that her primary challenge with anxiety is connected to her heatlhcare.  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  Patient states that she feels very nervous and anxious whenever she is around people and frequently avoids contacts with others including family  Patient states that she has been upset about recent blood pressure medication because it makes her feel worse rather than better after trying it for 2 months.  She has stopped the medication and is not willing to try additional medication for blood pressure at this time.  Patient states that she would be willing to try a PRN anxiety medication to help when her anxiety and stress levels lead to anxiety attacks to prevent the attacks.  CSW processed multiple relaxation techniques with patient, of which the patient is willing to try 2.  CSW will have a follow up in 3 weeks.  Beverly Sessions MSW, LCSW [phone] Duration - 50 min.

## 2013-03-14 ENCOUNTER — Ambulatory Visit: Payer: No Typology Code available for payment source

## 2013-03-22 ENCOUNTER — Ambulatory Visit (INDEPENDENT_AMBULATORY_CARE_PROVIDER_SITE_OTHER): Payer: Medicaid Other | Admitting: Physician Assistant

## 2013-03-22 ENCOUNTER — Ambulatory Visit: Payer: Medicaid Other | Attending: Family Medicine

## 2013-03-22 DIAGNOSIS — R079 Chest pain, unspecified: Secondary | ICD-10-CM

## 2013-03-22 NOTE — Progress Notes (Unsigned)
CSW met with patient in order to continue processing challenges with depression and anxiety.  Patient provided several challenges to process of reframing and acknowledged that this was the central challenge that she was having and desired to continue working to improve this issue. Patient identified trauma from her past and as CSW began to assess strengths patient continued resistance. CSW also processed challenges with anxiety about healthcare visits.  Patient states that she does not feel heard when she meets with docotors.  Patient states that she feels she needs ongoing help with managing her anxiety through prescription but is unwilling to take bp medication due to side effects.   CSW will continue to provide mental and emotional processing for patient progress.   Beverly Sessions MSW, LCSW [phone] Duration - 50 min

## 2013-03-22 NOTE — Progress Notes (Signed)
Exercise Treadmill Test  Pre-Exercise Testing Evaluation Rhythm: normal sinus  Rate: 85        Test  Exercise Tolerance Test Ordering MD: Olga Millers, MD  Interpreting MD: Tereso Newcomer, PA-C  Unique Test No: 1  Treadmill:  1  Indication for ETT: chest pain - rule out ischemia  Contraindication to ETT: No   Stress Modality: exercise - treadmill  Cardiac Imaging Performed: non   Protocol: standard Bruce - maximal  Max BP:  197/99  Max MPHR (bpm):  169 85% MPR (bpm):  144  MPHR obtained (bpm):  155 % MPHR obtained:  92  Reached 85% MPHR (min:sec):  3:27 Total Exercise Time (min-sec):  4:30  Workload in METS:  6.4 Borg Scale: 13  Reason ETT Terminated:  dyspnea    ST Segment Analysis At Rest: normal ST segments - no evidence of significant ST depression With Exercise: no evidence of significant ST depression  Other Information Arrhythmia:  No Angina during ETT:  absent (0) Quality of ETT:  diagnostic  ETT Interpretation:  normal - no evidence of ischemia by ST analysis  Comments: Fair exercise tolerance. No chest pain.  There was significant dyspnea.   Normal BP response to exercise. No ST-T changes to suggest ischemia.   Recommendations: F/u with Dr. Olga Millers as directed. Signed,  Tereso Newcomer, PA-C   03/22/2013 9:35 AM

## 2013-03-29 ENCOUNTER — Ambulatory Visit: Payer: Self-pay

## 2013-04-04 ENCOUNTER — Ambulatory Visit: Payer: Medicaid Other | Attending: Internal Medicine

## 2013-04-04 ENCOUNTER — Ambulatory Visit: Payer: Medicaid Other | Attending: Internal Medicine | Admitting: Family Medicine

## 2013-04-04 VITALS — BP 136/83 | HR 60 | Temp 98.7°F | Resp 18 | Wt 180.2 lb

## 2013-04-04 DIAGNOSIS — E785 Hyperlipidemia, unspecified: Secondary | ICD-10-CM | POA: Insufficient documentation

## 2013-04-04 DIAGNOSIS — F17200 Nicotine dependence, unspecified, uncomplicated: Secondary | ICD-10-CM

## 2013-04-04 DIAGNOSIS — Z7982 Long term (current) use of aspirin: Secondary | ICD-10-CM | POA: Insufficient documentation

## 2013-04-04 DIAGNOSIS — R0789 Other chest pain: Secondary | ICD-10-CM

## 2013-04-04 DIAGNOSIS — R002 Palpitations: Secondary | ICD-10-CM

## 2013-04-04 DIAGNOSIS — K219 Gastro-esophageal reflux disease without esophagitis: Secondary | ICD-10-CM

## 2013-04-04 DIAGNOSIS — Z79899 Other long term (current) drug therapy: Secondary | ICD-10-CM | POA: Insufficient documentation

## 2013-04-04 DIAGNOSIS — I10 Essential (primary) hypertension: Secondary | ICD-10-CM | POA: Insufficient documentation

## 2013-04-04 DIAGNOSIS — F411 Generalized anxiety disorder: Secondary | ICD-10-CM

## 2013-04-04 MED ORDER — LORAZEPAM 0.5 MG PO TABS: 0.5000 mg | ORAL_TABLET | Freq: Four times a day (QID) | ORAL | Status: DC | PRN

## 2013-04-04 NOTE — Progress Notes (Signed)
Patient here for follow up _HTN 

## 2013-04-04 NOTE — Patient Instructions (Addendum)
Anxiety and Panic Attacks Anxiety is your body's way of reacting to real danger or something you think is a danger. It may be fear or worry over a situation like losing your job. Sometimes the cause is not known. A panic attack is made up of physical signs like sweating, shaking, or chest pain. Anxiety and panic attacks may start suddenly. They may be strong. They may come at any time of day, even while sleeping. They may come at any time of life. Panic attacks are scary, but they do not harm you physically.  HOME CARE  Avoid any known causes of your anxiety.  Try to relax. Yoga may help. Tell yourself everything will be okay.  Exercise often.  Get expert advice and help (therapy) to stop anxiety or attacks from happening.  Avoid caffeine, alcohol, and drugs.  Only take medicine as told by your doctor. GET HELP RIGHT AWAY IF:  Your attacks seem different than normal attacks.  Your problems are getting worse or concern you. MAKE SURE YOU:  Understand these instructions.  Will watch your condition.  Will get help right away if you are not doing well or get worse. Document Released: 08/09/2010 Document Revised: 09/29/2011 Document Reviewed: 08/09/2010 [facility] Patient Information 2014 [place], [place].   Palpitations  A palpitation is the feeling that your heartbeat is irregular. It may feel like your heart is fluttering or skipping a beat. It may also feel like your heart is beating faster than normal. This is usually not a serious problem. In some cases, you may need more medical tests. HOME CARE  Avoid:  Caffeine in coffee, tea, soft drinks, diet pills, and energy drinks.  Chocolate.  Alcohol.  Stop smoking if you smoke.  Reduce your stress and anxiety. Try:  A method that measures bodily functions so you can learn to control them (biofeedback).  Yoga.  Meditation.  Physical activity such as swimming, jogging, or walking.  Get plenty of rest and sleep. GET HELP  RIGHT AWAY IF:   You have chest pain.  You feel short of breath.  You have a very bad headache.  You feel dizzy or pass out (faint).  Your fast or irregular heartbeat continues after 24 hours.  Your palpitations occur more often. MAKE SURE YOU:   Understand these instructions.  Will watch your condition.  Will get help right away if you are not doing well or get worse. Document Released: 04/15/2008 Document Revised: 01/06/2012 Document Reviewed: 09/05/2011 [facility] Patient Information 2014 [place], [place].  Smoking Cessation, Tips for Success YOU CAN QUIT SMOKING If you are ready to quit smoking, congratulations! You have chosen to help yourself be healthier. Cigarettes bring nicotine, tar, carbon monoxide, and other irritants into your body. Your lungs, heart, and blood vessels will be able to work better without these poisons. There are many different ways to quit smoking. Nicotine gum, nicotine patches, a nicotine inhaler, or nicotine nasal spray can help with physical craving. Hypnosis, support groups, and medicines help break the habit of smoking. Here are some tips to help you quit for good.  Throw away all cigarettes.  Clean and remove all ashtrays from your home, work, and car.  On a card, write down your reasons for quitting. Carry the card with you and read it when you get the urge to smoke.  Cleanse your body of nicotine. Drink enough water and fluids to keep your urine clear or pale yellow. Do this after quitting to flush the nicotine from your body.  Learn to predict your moods. Do not let a bad situation be your excuse to have a cigarette. Some situations in your life might tempt you into wanting a cigarette.  Never have "just one" cigarette. It leads to wanting another and another. Remind yourself of your decision to quit.  Change habits associated with smoking. If you smoked while driving or when feeling stressed, try other activities to replace smoking. Stand  up when drinking your coffee. Brush your teeth after eating. Sit in a different chair when you read the paper. Avoid alcohol while trying to quit, and try to drink fewer caffeinated beverages. Alcohol and caffeine may urge you to smoke.  Avoid foods and drinks that can trigger a desire to smoke, such as sugary or spicy foods and alcohol.  Ask people who smoke not to smoke around you.  Have something planned to do right after eating or having a cup of coffee. Take a walk or exercise to perk you up. This will help to keep you from overeating.  Try a relaxation exercise to calm you down and decrease your stress. Remember, you may be tense and nervous for the first 2 weeks after you quit, but this will pass.  Find new activities to keep your hands busy. Play with a pen, coin, or rubber band. Doodle or draw things on paper.  Brush your teeth right after eating. This will help cut down on the craving for the taste of tobacco after meals. You can try mouthwash, too.  Use oral substitutes, such as lemon drops, carrots, a cinnamon stick, or chewing gum, in place of cigarettes. Keep them handy so they are available when you have the urge to smoke.  When you have the urge to smoke, try deep breathing.  Designate your home as a nonsmoking area.  If you are a heavy smoker, ask your caregiver about a prescription for nicotine chewing gum. It can ease your withdrawal from nicotine.  Reward yourself. Set aside the cigarette money you save and buy yourself something nice.  Look for support from others. Join a support group or smoking cessation program. Ask someone at home or at work to help you with your plan to quit smoking.  Always ask yourself, "Do I need this cigarette or is this just a reflex?" Tell yourself, "Today, I choose not to smoke," or "I do not want to smoke." You are reminding yourself of your decision to quit, even if you do smoke a cigarette. HOW WILL I FEEL WHEN I QUIT SMOKING?  The  benefits of not smoking start within days of quitting.  You may have symptoms of withdrawal because your body is used to nicotine (the addictive substance in cigarettes). You may crave cigarettes, be irritable, feel very hungry, cough often, get headaches, or have difficulty concentrating.  The withdrawal symptoms are only temporary. They are strongest when you first quit but will go away within 10 to 14 days.  When withdrawal symptoms occur, stay in control. Think about your reasons for quitting. Remind yourself that these are signs that your body is healing and getting used to being without cigarettes.  Remember that withdrawal symptoms are easier to treat than the major diseases that smoking can cause.  Even after the withdrawal is over, expect periodic urges to smoke. However, these cravings are generally short-lived and will go away whether you smoke or not. Do not smoke!  If you relapse and smoke again, do not lose hope. Most smokers quit 3 times before  they are successful.  If you relapse, do not give up! Plan ahead and think about what you will do the next time you get the urge to smoke. LIFE AS A NONSMOKER: MAKE IT FOR A MONTH, MAKE IT FOR LIFE Day 1: Hang this page where you will see it every day. Day 2: Get rid of all ashtrays, matches, and lighters. Day 3: Drink water. Breathe deeply between sips. Day 4: Avoid places with smoke-filled air, such as bars, clubs, or the smoking section of restaurants. Day 5: Keep track of how much money you save by not smoking. Day 6: Avoid boredom. Keep a good book with you or go to the movies. Day 7: Reward yourself! One week without smoking! Day 8: Make a dental appointment to get your teeth cleaned. Day 9: Decide how you will turn down a cigarette before it is offered to you. Day 10: Review your reasons for quitting. Day 11: Distract yourself. Stay active to keep your mind off smoking and to relieve tension. Take a walk, exercise, read a book,  do a crossword puzzle, or try a new hobby. Day 12: Exercise. Get off the bus before your stop or use stairs instead of escalators. Day 13: Call on friends for support and encouragement. Day 14: Reward yourself! Two weeks without smoking! Day 15: Practice deep breathing exercises. Day 16: Bet a friend that you can stay a nonsmoker. Day 17: Ask to sit in nonsmoking sections of restaurants. Day 18: Hang up "No Smoking" signs. Day 19: Think of yourself as a nonsmoker. Day 20: Each morning, tell yourself you will not smoke. Day 21: Reward yourself! Three weeks without smoking! Day 22: Think of smoking in negative ways. Remember how it stains your teeth, gives you bad breath, and leaves you short of breath. Day 23: Eat a nutritious breakfast. Day 24:Do not relive your days as a smoker. Day 25: Hold a pencil in your hand when talking on the telephone. Day 26: Tell all your friends you do not smoke. Day 27: Think about how much better food tastes. Day 28: Remember, one cigarette is one too many. Day 29: Take up a hobby that will keep your hands busy. Day 30: Congratulations! One month without smoking! Give yourself a big reward. Your caregiver can direct you to community resources or hospitals for support, which may include:  Group support.  Education.  Hypnosis.  Subliminal therapy. Document Released: 04/04/2004 Document Revised: 09/29/2011 Document Reviewed: 04/23/2009 [facility] Patient Information 2014 [place], [place].

## 2013-04-04 NOTE — Progress Notes (Unsigned)
Patient presented with high levels of anxiety today due to medical visit.  Patient has presented for multiple counseling sessions and has never presented with this level of anxiety.  CSW attempted multiple techniques to help patient calm down.  Deep breathing seemed to have some limited effectiveness.  CSW also attempted to process previous challenges with medical care that lead to this level of anxiety.  Patient was unable to process due to high level of anxiety.  CSW also accompanied patient into appointment with MD for support.  Patient welcomed that support and toward the end of her medial visit her anxiety clearly decreased.    Patient will follow up with this CSW in 1 week.  CSW will identify psychiatrist in local area to follow up on higher level of treatment for anxiety.  Beverly Sessions MSW, LCSW [phone] Duration - 50 min

## 2013-04-04 NOTE — Progress Notes (Signed)
Patient ID: Dana Roth, female   DOB: 01-18-1961, 52 y.o.   MRN: [id number]  CC:  Follow up   HPI: Pt is very anxious.  She is reporting that she has been taking alprazolam tabs that she has obtained from friends and family and on the streets and reports that she was very anxious and nervous about coming to see the doctor today. She has been meeting regularly with the social worker and he reports to me that she is more anxious today than he has ever seen her before.  I concur.  She is pacing back and forth across the exam room. She is fanning her face and reporting that she is hot but the room temp is actually very cool. The patient did go to see cardiology and was told that her chest pain was musculoskeletal related to wearing her bra.  She needs a note so that she doesn't have to wear bra when working because of the chest pain that it causes.   Pt denies depression, hallucination, elaborate spending of money, etc.  She says that she is having symptoms of nervousness.    Allergies  Allergen Reactions  . Codeine    Past Medical History  Diagnosis Date  . Rectal bleeding   . GERD (gastroesophageal reflux disease)   . HTN (hypertension)   . Needle phobia   . Depression   . Dyslipidemia   . Suspicious mole   . Multiple osteochondroma     Multiple bone surgeries   Current Outpatient Prescriptions on File Prior to Visit  Medication Sig Dispense Refill  . aspirin EC 81 MG tablet Take 1 tablet (81 mg total) by mouth daily.  30 tablet  1  . metoprolol tartrate (LOPRESSOR) 25 MG tablet Take 0.5 tablets (12.5 mg total) by mouth 2 (two) times daily.  30 tablet  1  . oxyCODONE-acetaminophen (PERCOCET/ROXICET) 5-325 MG per tablet Take 1 tablet by mouth every 4 (four) hours as needed for pain.      . pantoprazole (PROTONIX) 40 MG tablet Take 1 tablet (40 mg total) by mouth daily.  30 tablet  1   No current facility-administered medications on file prior to visit.   Family History  Problem  Relation Age of Onset  . Heart disease Brother     "Hole in heart"  . CAD Sister    History   Social History  . Marital Status: Divorced    Spouse Name: N/A    Number of Children: N/A  . Years of Education: N/A   Occupational History  .      [employer]   Social History Main Topics  . Smoking status: Current Every Day Smoker  . Smokeless tobacco: Not on file  . Alcohol Use: No  . Drug Use: Yes    Special: Marijuana  . Sexual Activity: Not on file   Other Topics Concern  . Not on file   Social History Narrative  . No narrative on file    Review of Systems  Constitutional: Negative for fever, chills, diaphoresis, activity change, appetite change and fatigue.  HENT: Negative for ear pain, nosebleeds, congestion, facial swelling, rhinorrhea, neck pain, neck stiffness and ear discharge.   Eyes: Negative for pain, discharge, redness, itching and visual disturbance.  Respiratory: Negative for cough, choking, chest tightness, shortness of breath, wheezing and stridor.   Cardiovascular: Negative for chest pain, palpitations and leg swelling.  Gastrointestinal: Negative for abdominal distention.  Genitourinary: Negative for dysuria, urgency, frequency, hematuria, flank  pain, decreased urine volume, difficulty urinating and dyspareunia.  Musculoskeletal: Negative for back pain, joint swelling, arthralgias and gait problem.  Neurological: Negative for dizziness, tremors, seizures, syncope, facial asymmetry, speech difficulty, weakness, light-headedness, numbness and headaches.  Hematological: Negative for adenopathy. Does not bruise/bleed easily.  Psychiatric/Behavioral: Negative for hallucinations, behavioral problems, confusion, dysphoric mood, decreased concentration and agitation.    Objective:   Filed Vitals:   04/04/13 1022  BP: 136/83  Pulse: 60  Temp: 98.7 F (37.1 C)  Resp: 18    Physical Exam  Constitutional: Appears well-developed and  well-nourished. No distress.  HENT: Normocephalic. External right and left ear normal. Oropharynx is clear and moist.  Eyes: Conjunctivae and EOM are normal. PERRLA, no scleral icterus.  Neck: Normal ROM. Neck supple. No JVD. No tracheal deviation. No thyromegaly.  CVS: RRR, S1/S2 +, no murmurs, no gallops, no carotid bruit.  Pulmonary: Effort and breath sounds normal, no stridor, rhonchi, wheezes, rales.  Abdominal: Soft. BS +,  no distension, tenderness, rebound or guarding.  Musculoskeletal: Normal range of motion. No edema and no tenderness.  Lymphadenopathy: No lymphadenopathy noted, cervical, inguinal. Neuro: Alert. Normal reflexes, muscle tone coordination. No cranial nerve deficit. Skin: Skin is warm and dry. No rash noted. Not diaphoretic. No erythema. No pallor.  Psychiatric: Normal mood and affect. Behavior, judgment, thought content normal.   Lab Results  Component Value Date   WBC 8.0 01/03/2013   HGB 15.6* 01/03/2013   HCT 46.0 01/03/2013   MCV 85.5 01/03/2013   PLT 184 01/03/2013   Lab Results  Component Value Date   CREATININE 0.78 01/03/2013   BUN 10 01/03/2013   NA 138 01/03/2013   K 4.6 01/03/2013   CL 104 01/03/2013   CO2 25 01/03/2013    No results found for this basename: HGBA1C   Lipid Panel     Component Value Date/Time   CHOL 230* 01/03/2013 0922   TRIG 203* 01/03/2013 0922   HDL 37* 01/03/2013 0922   CHOLHDL 6.2 01/03/2013 0922   VLDL 41* 01/03/2013 0922   LDLCALC 152* 01/03/2013 0922      Assessment and plan:   Patient Active Problem List   Diagnosis Date Noted  . Musculoskeletal chest pain 04/04/2013  . Generalized anxiety disorder 04/04/2013  . Palpitations 04/04/2013  . Chest pain 02/24/2013  . Needle phobia 01/17/2013  . Depression 01/17/2013  . Dyslipidemia 01/17/2013  . Unspecified essential hypertension 01/17/2013  . Suspicious mole 01/17/2013  . Smoker 01/03/2013  . Rectal bleeding 01/03/2013  . GERD (gastroesophageal reflux disease)  01/03/2013  . HTN (hypertension) 01/03/2013   Pt is having a significant anxiety episode today in the office.  We made every effort to get patient into a qualified psychiatrist for further care.  Pt also advised to go directly to ER if symptoms worsened. The patient verbalized understanding.  .  Pt met with social worker today and was assessed and resources provided in visit for patient to get mental health care and further counseling.   Rx provided for lorazepam 0.5 mg po every 6 hour prn anxiety Referred to see a psychiatrist, Pt has declined to go to [facility] because she says she has been there in the past and didn't like it.  The social worker is working hard to get her in to see a different psychiatrist.  He screened her again and didn't feel that she was bipolar.    I counseled the patient about self-medicating with controlled substances.  Apparently she has been  taking alprazolam tablets obtained from friends and family for years.    RTC in 1 month for medical follow up.   Pt is scheduled to see skin procedure clinic in family practice center.  Pt is scheduled to have screening colonoscopy next month.   The patient was given clear instructions to go to ER or return to medical center if symptoms don't improve, worsen or new problems develop.  The patient verbalized understanding.  The patient was told to call to get any lab results if not heard anything in the next week.    Rodney Langton, MD, CDE, FAAFP [facility] [place], [place]

## 2013-04-12 ENCOUNTER — Ambulatory Visit: Payer: Medicaid Other | Attending: Family Medicine

## 2013-04-12 NOTE — Progress Notes (Unsigned)
CSW met with patient in order to process challenges managing mood and emotions.  CSW lead the session by reviewing the previous medical appointment with the doctor.  Patient stated that she was still excessively anxious but thought the doctor treated her with great care.  CSW worked with patient on contrasting actual with perceived experiences in order to decrease anxiety.  Patient had difficulty with the concept because of her history with her anxiety.  Patient stated that medication for anxiety has been helpful and understand that in order to continue with the medication she will need to find a psychiatrist to work with on medication management.  Patient states that this process is still anxiety producing but understands that she must go through it, and is willing to follow the lead of CSW.  CSW will make referral for psychiatrist and facilitate transition.  Beverly Sessions MSW, LCSW [phone] Duration - 45 min

## 2013-04-14 ENCOUNTER — Ambulatory Visit: Payer: Medicaid Other

## 2013-04-26 ENCOUNTER — Ambulatory Visit: Payer: Medicaid Other | Attending: Internal Medicine

## 2013-04-26 NOTE — Progress Notes (Unsigned)
CSW met with patient in order provide counseling around challenges with anxiety.  Patient stated that she has had improvements in dealing with her anxiety.  Patient presents with decreased symptoms of anxiety.  Challenges still continue with preparing for public engagement (work, md appts) but patient expresses some improvement.  CSW processed with patient visualizations to decrease symptoms of anxiety.  CSW also processed with patient history of challenge behaviors and mood that are connected to history of trauma.  CSW processed safety and current supports to establish security despite history of anxiety.  Patient partially receptive and her anxiety began to to visibly increase.  CSW used techniques to reduce anxiety to prepare to close and dismiss.  Patient receptive but recognizing continued challenges with anxiety.  CSW will follow as needed.  Beverly Sessions MSW, LCSW Duration 30 min

## 2013-05-03 ENCOUNTER — Encounter: Payer: Self-pay | Admitting: Internal Medicine

## 2013-05-03 ENCOUNTER — Ambulatory Visit: Payer: Medicaid Other | Attending: Internal Medicine | Admitting: Internal Medicine

## 2013-05-03 ENCOUNTER — Ambulatory Visit (HOSPITAL_COMMUNITY)
Admission: RE | Admit: 2013-05-03 | Discharge: 2013-05-03 | Disposition: A | Discharge status: Home / Self Care | Payer: Medicaid Other | Source: Ambulatory Visit | Attending: Internal Medicine | Admitting: Internal Medicine

## 2013-05-03 VITALS — BP 129/82 | HR 88 | Temp 98.3°F | Resp 18 | Ht 64.0 in | Wt 180.0 lb

## 2013-05-03 DIAGNOSIS — E669 Obesity, unspecified: Secondary | ICD-10-CM | POA: Insufficient documentation

## 2013-05-03 DIAGNOSIS — M47817 Spondylosis without myelopathy or radiculopathy, lumbosacral region: Secondary | ICD-10-CM | POA: Insufficient documentation

## 2013-05-03 DIAGNOSIS — Z0000 Encounter for general adult medical examination without abnormal findings: Secondary | ICD-10-CM

## 2013-05-03 DIAGNOSIS — M4040 Postural lordosis, site unspecified: Secondary | ICD-10-CM | POA: Insufficient documentation

## 2013-05-03 DIAGNOSIS — M549 Dorsalgia, unspecified: Secondary | ICD-10-CM | POA: Insufficient documentation

## 2013-05-03 MED ORDER — OXYCODONE-ACETAMINOPHEN 5-325 MG PO TABS: 1.0000 | ORAL_TABLET | Freq: Three times a day (TID) | ORAL | Status: DC | PRN

## 2013-05-03 NOTE — Patient Instructions (Signed)
Back Pain, Adult  Low back pain is very common. About 1 in 5 people have back pain. The cause of low back pain is rarely dangerous. The pain often gets better over time. About half of people with a sudden onset of back pain feel better in just 2 weeks. About 8 in 10 people feel better by 6 weeks.   CAUSES  Some common causes of back pain include:  · Strain of the muscles or ligaments supporting the spine.  · Wear and tear (degeneration) of the spinal discs.  · Arthritis.  · Direct injury to the back.  DIAGNOSIS  Most of the time, the direct cause of low back pain is not known. However, back pain can be treated effectively even when the exact cause of the pain is unknown. Answering your caregiver's questions about your overall health and symptoms is one of the most accurate ways to make sure the cause of your pain is not dangerous. If your caregiver needs more information, he or she may order lab work or imaging tests (X-rays or MRIs). However, even if imaging tests show changes in your back, this usually does not require surgery.  HOME CARE INSTRUCTIONS  For many people, back pain returns. Since low back pain is rarely dangerous, it is often a condition that people can learn to manage on their own.   · Remain active. It is stressful on the back to sit or stand in one place. Do not sit, drive, or stand in one place for more than 30 minutes at a time. Take short walks on level surfaces as soon as pain allows. Try to increase the length of time you walk each day.  · Do not stay in bed. Resting more than 1 or 2 days can delay your recovery.  · Do not avoid exercise or work. Your body is made to move. It is not dangerous to be active, even though your back may hurt. Your back will likely heal faster if you return to being active before your pain is gone.  · Pay attention to your body when you  bend and lift. Many people have less discomfort when lifting if they bend their knees, keep the load close to their bodies, and  avoid twisting. Often, the most comfortable positions are those that put less stress on your recovering back.  · Find a comfortable position to sleep. Use a firm mattress and lie on your side with your knees slightly bent. If you lie on your back, put a pillow under your knees.  · Only take over-the-counter or prescription medicines as directed by your caregiver. Over-the-counter medicines to reduce pain and inflammation are often the most helpful. Your caregiver may prescribe muscle relaxant drugs. These medicines help dull your pain so you can more quickly return to your normal activities and healthy exercise.  · Put ice on the injured area.  · Put ice in a plastic bag.  · Place a towel between your skin and the bag.  · Leave the ice on for 15-20 minutes, 3-4 times a day for the first 2 to 3 days. After that, ice and heat may be alternated to reduce pain and spasms.  · Ask your caregiver about trying back exercises and gentle massage. This may be of some benefit.  · Avoid feeling anxious or stressed. Stress increases muscle tension and can worsen back pain. It is important to recognize when you are anxious or stressed and learn ways to manage it. Exercise is a great option.  SEEK MEDICAL CARE IF:  · You have pain that is not relieved with rest or   medicine.  · You have pain that does not improve in 1 week.  · You have new symptoms.  · You are generally not feeling well.  SEEK IMMEDIATE MEDICAL CARE IF:   · You have pain that radiates from your back into your legs.  · You develop new bowel or bladder control problems.  · You have unusual weakness or numbness in your arms or legs.  · You develop nausea or vomiting.  · You develop abdominal pain.  · You feel faint.  Document Released: 07/07/2005 Document Revised: 01/06/2012 Document Reviewed: 11/25/2010  [employer]® Patient Information ©2014 [employer].

## 2013-05-03 NOTE — Progress Notes (Signed)
Pt is here for a f/u visit. Pt has a persistent lower back pain. Today the pain is a 4, shooting stabbing and radiates down her left leg.

## 2013-05-03 NOTE — Addendum Note (Signed)
Addended by: Alison Murray on: 05/03/2013 10:16 AM   Modules accepted: Orders

## 2013-05-03 NOTE — Progress Notes (Addendum)
CC: Followup  HPI: 52 year old female with past medical history of back pain, smoker who presented to clinic for followup. Patient reports not taking any medications except for on and off aspirin. She reports low back pain for past 2 weeks but is able to ambulate. She reports not taking pain medications to relieve pain. Her pain is dull in nature and 5-6/10 in intensity. Pain is throughout the day and at nighttime. No chest pain or shortness of breath. No palpitations. No fevers or chills.  Allergies  Allergen Reactions  . Codeine    Past Medical History  Diagnosis Date  . Rectal bleeding   . GERD (gastroesophageal reflux disease)   . HTN (hypertension)   . Needle phobia   . Depression   . Dyslipidemia   . Suspicious mole   . Multiple osteochondroma     Multiple bone surgeries   Current Outpatient Prescriptions on File Prior to Visit  Medication Sig Dispense Refill  . aspirin EC 81 MG tablet Take 1 tablet (81 mg total) by mouth daily.  30 tablet  1  . LORazepam (ATIVAN) 0.5 MG tablet Take 1 tablet (0.5 mg total) by mouth every 6 (six) hours as needed for anxiety.  30 tablet  0  . metoprolol tartrate (LOPRESSOR) 25 MG tablet Take 0.5 tablets (12.5 mg total) by mouth 2 (two) times daily.  30 tablet  1  . pantoprazole (PROTONIX) 40 MG tablet Take 1 tablet (40 mg total) by mouth daily.  30 tablet  1   No current facility-administered medications on file prior to visit.   Family History  Problem Relation Age of Onset  . Heart disease Brother     "Hole in heart"  . CAD Sister    History   Social History  . Marital Status: Divorced    Spouse Name: N/A    Number of Children: N/A  . Years of Education: N/A   Occupational History  .      [employer]   Social History Main Topics  . Smoking status: Current Every Day Smoker  . Smokeless tobacco: Not on file  . Alcohol Use: No  . Drug Use: Yes    Special: Marijuana  . Sexual Activity: Not on file   Other Topics  Concern  . Not on file   Social History Narrative  . No narrative on file    Review of Systems  Constitutional: Negative for fever, chills, diaphoresis, activity change, appetite change and fatigue.  HENT: Negative for ear pain, nosebleeds, congestion, facial swelling, rhinorrhea, neck pain, neck stiffness and ear discharge.   Eyes: Negative for pain, discharge, redness, itching and visual disturbance.  Respiratory: Negative for cough, choking, chest tightness, shortness of breath, wheezing and stridor.   Cardiovascular: Negative for chest pain, palpitations and leg swelling.  Gastrointestinal: Negative for abdominal distention.  Genitourinary: Negative for dysuria, urgency, frequency, hematuria, flank pain, decreased urine volume, difficulty urinating and dyspareunia.  Musculoskeletal: positive for back pain, no joint swelling, arthralgias and gait problem.  Neurological: Negative for dizziness, tremors, seizures, syncope, facial asymmetry, speech difficulty, weakness, light-headedness, numbness and headaches.  Hematological: Negative for adenopathy. Does not bruise/bleed easily.  Psychiatric/Behavioral: Negative for hallucinations, behavioral problems, confusion, dysphoric mood, decreased concentration and agitation.    Objective:   Filed Vitals:   05/03/13 0950  BP: 129/82  Pulse: 88  Temp: 98.3 F (36.8 C)  Resp: 18    Physical Exam  Constitutional: Appears well-developed and well-nourished. No distress.  HENT: Normocephalic.  External right and left ear normal. Oropharynx is clear and moist.  Eyes: Conjunctivae and EOM are normal. PERRLA, no scleral icterus.  Neck: Normal ROM. Neck supple. No JVD. No tracheal deviation. No thyromegaly.  CVS: RRR, S1/S2 +, no murmurs, no gallops, no carotid bruit.  Pulmonary: Effort and breath sounds normal, no stridor, rhonchi, wheezes, rales.  Abdominal: Soft. BS +,  no distension, tenderness, rebound or guarding.  Musculoskeletal: Normal  range of motion. No edema and no tenderness.  Lymphadenopathy: No lymphadenopathy noted, cervical, inguinal. Neuro: Alert. Normal reflexes, muscle tone coordination. No cranial nerve deficit. Skin: Skin is warm and dry. No rash noted. Not diaphoretic. No erythema. No pallor.  Psychiatric: Normal mood and affect. Behavior, judgment, thought content normal.   Lab Results  Component Value Date   WBC 8.0 01/03/2013   HGB 15.6* 01/03/2013   HCT 46.0 01/03/2013   MCV 85.5 01/03/2013   PLT 184 01/03/2013   Lab Results  Component Value Date   CREATININE 0.78 01/03/2013   BUN 10 01/03/2013   NA 138 01/03/2013   K 4.6 01/03/2013   CL 104 01/03/2013   CO2 25 01/03/2013    No results found for this basename: HGBA1C   Lipid Panel     Component Value Date/Time   CHOL 230* 01/03/2013 0922   TRIG 203* 01/03/2013 0922   HDL 37* 01/03/2013 0922   CHOLHDL 6.2 01/03/2013 0922   VLDL 41* 01/03/2013 0922   LDLCALC 152* 01/03/2013 0922       Assessment and plan:   Patient Active Problem List   Diagnosis Date Noted  . Musculoskeletal back pain - order placed for x ray of lumbar spine  04/04/2013  . Preventive health - Referral to GI provided for colonoscopy  - Referral to GYN for Pap provided  - Order placed for mammogram  - Asian reviews influenza vaccine  04/04/2013

## 2013-05-09 ENCOUNTER — Ambulatory Visit: Payer: Medicaid Other | Attending: Internal Medicine

## 2013-05-09 NOTE — Progress Notes (Unsigned)
CSW met with patient in order to continue working on managing anxiety and depressive symptoms.  CSW continued to provide patient with redirection about her emotional challenges and provided redirection for poor thinking patters.  CSW did some guided imagery in order to support the idea that patient is in control of her mental and emotional state.  CSW encouraged patient to practice and embrace this control in her life.  CSW also provided resources for patient to seek psychiatric care and counseling with [facility].  CSW also provided resources for local food pantries.    CSW will continue to provide support and redirection to manage emotional challenges.  Dana Roth MSW, LCSW 45 min duration

## 2013-05-11 ENCOUNTER — Encounter (HOSPITAL_COMMUNITY): Payer: Self-pay | Admitting: Pharmacy Technician

## 2013-05-18 ENCOUNTER — Encounter (HOSPITAL_COMMUNITY): Payer: Self-pay | Admitting: *Deleted

## 2013-05-23 ENCOUNTER — Other Ambulatory Visit: Payer: Self-pay | Admitting: Internal Medicine

## 2013-05-23 ENCOUNTER — Ambulatory Visit (HOSPITAL_COMMUNITY)
Admission: RE | Admit: 2013-05-23 | Discharge: 2013-05-23 | Disposition: A | Discharge status: Home / Self Care | Payer: Medicaid Other | Source: Ambulatory Visit | Attending: Internal Medicine | Admitting: Internal Medicine

## 2013-05-23 DIAGNOSIS — Z1231 Encounter for screening mammogram for malignant neoplasm of breast: Secondary | ICD-10-CM

## 2013-05-23 DIAGNOSIS — Z0000 Encounter for general adult medical examination without abnormal findings: Secondary | ICD-10-CM

## 2013-05-24 ENCOUNTER — Ambulatory Visit: Payer: Medicaid Other | Attending: Internal Medicine

## 2013-05-24 ENCOUNTER — Other Ambulatory Visit: Payer: Medicaid Other

## 2013-05-24 NOTE — Progress Notes (Unsigned)
Pt met with LCSW and presented with fewer signs of anxiety.  Today presentation was shaking hand and tapping foot.  About 10 minutes into session patiens anxieties quieted and patient was more focused and relaxed.  CSW processed with patient challenges social and relational connections.  CSW processed successes with overcoming different obstacles but patient consistently deflects success, strength and power to others and not herself.  CSW also processed with patient the strength of her ethics and belief system, which patient was in agreement with, but could not see it as significant to positive identify.  Patient identified that she made contact with MH agency in the community and will go for an appointment at the end of this month.    CSW will follow up with next appointment in 2 weeks.  Beverly Sessions MSW, LCSW Duration - 40 min

## 2013-05-31 ENCOUNTER — Other Ambulatory Visit: Payer: Self-pay | Admitting: Gastroenterology

## 2013-06-02 ENCOUNTER — Ambulatory Visit (HOSPITAL_COMMUNITY): Payer: Medicaid Other | Admitting: Anesthesiology

## 2013-06-02 ENCOUNTER — Encounter (HOSPITAL_COMMUNITY)
Admission: RE | Disposition: A | Discharge status: Home / Self Care | Payer: Self-pay | Source: Ambulatory Visit | Attending: Gastroenterology

## 2013-06-02 ENCOUNTER — Ambulatory Visit (HOSPITAL_COMMUNITY)
Admission: RE | Admit: 2013-06-02 | Discharge: 2013-06-02 | Disposition: A | Discharge status: Home / Self Care | Payer: Medicaid Other | Source: Ambulatory Visit | Attending: Gastroenterology | Admitting: Gastroenterology

## 2013-06-02 ENCOUNTER — Encounter (HOSPITAL_COMMUNITY): Payer: Self-pay | Admitting: *Deleted

## 2013-06-02 ENCOUNTER — Encounter (HOSPITAL_COMMUNITY): Payer: Medicaid Other | Admitting: Anesthesiology

## 2013-06-02 DIAGNOSIS — I10 Essential (primary) hypertension: Secondary | ICD-10-CM | POA: Insufficient documentation

## 2013-06-02 DIAGNOSIS — D371 Neoplasm of uncertain behavior of stomach: Secondary | ICD-10-CM | POA: Insufficient documentation

## 2013-06-02 DIAGNOSIS — K625 Hemorrhage of anus and rectum: Secondary | ICD-10-CM | POA: Insufficient documentation

## 2013-06-02 DIAGNOSIS — D126 Benign neoplasm of colon, unspecified: Secondary | ICD-10-CM | POA: Insufficient documentation

## 2013-06-02 DIAGNOSIS — D128 Benign neoplasm of rectum: Secondary | ICD-10-CM | POA: Insufficient documentation

## 2013-06-02 DIAGNOSIS — K648 Other hemorrhoids: Secondary | ICD-10-CM | POA: Insufficient documentation

## 2013-06-02 DIAGNOSIS — F17200 Nicotine dependence, unspecified, uncomplicated: Secondary | ICD-10-CM | POA: Insufficient documentation

## 2013-06-02 DIAGNOSIS — K5730 Diverticulosis of large intestine without perforation or abscess without bleeding: Secondary | ICD-10-CM | POA: Insufficient documentation

## 2013-06-02 DIAGNOSIS — K219 Gastro-esophageal reflux disease without esophagitis: Secondary | ICD-10-CM | POA: Insufficient documentation

## 2013-06-02 HISTORY — DX: Other complications of anesthesia, initial encounter: T88.59XA

## 2013-06-02 HISTORY — PX: COLONOSCOPY WITH PROPOFOL: SHX5780

## 2013-06-02 HISTORY — DX: Other specified postprocedural states: Z98.890

## 2013-06-02 HISTORY — DX: Adverse effect of unspecified anesthetic, initial encounter: T41.45XA

## 2013-06-02 HISTORY — DX: Nausea with vomiting, unspecified: R11.2

## 2013-06-02 SURGERY — COLONOSCOPY WITH PROPOFOL
Anesthesia: Monitor Anesthesia Care

## 2013-06-02 MED ORDER — SPOT INK MARKER SYRINGE KIT
PACK | SUBMUCOSAL | Status: AC
  Filled 2013-06-02: qty 5

## 2013-06-02 MED ORDER — SPOT INK MARKER SYRINGE KIT
PACK | SUBMUCOSAL | Status: DC | PRN
  Administered 2013-06-02: 3 mL via SUBMUCOSAL

## 2013-06-02 MED ORDER — PROPOFOL INFUSION 10 MG/ML OPTIME
INTRAVENOUS | Status: DC | PRN
  Administered 2013-06-02: 140 ug/kg/min via INTRAVENOUS

## 2013-06-02 MED ORDER — MIDAZOLAM HCL 5 MG/5ML IJ SOLN
INTRAMUSCULAR | Status: DC | PRN
  Administered 2013-06-02: 2 mg via INTRAVENOUS

## 2013-06-02 MED ORDER — KETAMINE HCL 10 MG/ML IJ SOLN
INTRAMUSCULAR | Status: DC | PRN
  Administered 2013-06-02: 20 mg via INTRAVENOUS

## 2013-06-02 SURGICAL SUPPLY — 22 items

## 2013-06-02 NOTE — Interval H&P Note (Signed)
History and Physical Interval Note:  06/02/2013 9:03 AM  [name]  has presented today for surgery, with the diagnosis of rectal bleeding  The various methods of treatment have been discussed with the patient and family. After consideration of risks, benefits and other options for treatment, the patient has consented to  Procedure(s): COLONOSCOPY WITH PROPOFOL (N/A) as a surgical intervention .  The patient's history has been reviewed, patient examined, no change in status, stable for surgery.  I have reviewed the patient's chart and labs.  Questions were answered to the patient's satisfaction.     [place] C.

## 2013-06-02 NOTE — Anesthesia Preprocedure Evaluation (Addendum)
Anesthesia Evaluation    History of Anesthesia Complications (+) PONV and history of anesthetic complications  Airway Mallampati: II TM Distance: >3 FB Neck ROM: Full    Dental no notable dental hx.    Pulmonary Current Smoker,  breath sounds clear to auscultation  Pulmonary exam normal       Cardiovascular hypertension, Rhythm:Regular Rate:Normal     Neuro/Psych PSYCHIATRIC DISORDERS Anxiety Depression    GI/Hepatic GERD-  ,  Endo/Other    Renal/GU      Musculoskeletal   Abdominal   Peds  Hematology   Anesthesia Other Findings   Reproductive/Obstetrics                          Anesthesia Physical Anesthesia Plan  ASA: II  Anesthesia Plan: MAC   Post-op Pain Management:    Induction: Intravenous  Airway Management Planned:   Additional Equipment:   Intra-op Plan:   Post-operative Plan:   Informed Consent: I have reviewed the patients History and Physical, chart, labs and discussed the procedure including the risks, benefits and alternatives for the proposed anesthesia with the patient or authorized representative who has indicated his/her understanding and acceptance.   Dental advisory given  Plan Discussed with: CRNA  Anesthesia Plan Comments: (Very anxious. Will give anxiolytic after consent signed.)        Anesthesia Quick Evaluation

## 2013-06-02 NOTE — Addendum Note (Signed)
Addended by: Charlott Rakes on: 06/02/2013 08:18 AM   Modules accepted: Orders

## 2013-06-02 NOTE — Transfer of Care (Signed)
Immediate Anesthesia Transfer of Care Note  Patient: Dana Roth  Procedure(s) Performed: Procedure(s): COLONOSCOPY WITH PROPOFOL (N/A)  Patient Location: PACU  Anesthesia Type:MAC  Level of Consciousness: awake, alert  and oriented  Airway & Oxygen Therapy: Patient Spontanous Breathing and Patient connected to face mask oxygen  Post-op Assessment: Report given to PACU RN and Post -op Vital signs reviewed and stable  Post vital signs: Reviewed and stable  Complications: No apparent anesthesia complications

## 2013-06-02 NOTE — Op Note (Signed)
[facility] [address] [place], [address]   COLONOSCOPY PROCEDURE REPORT  PATIENT: Dana Roth, Dana Roth  MR#: [id number] BIRTHDATE: [birth year] , 52  yrs. old GENDER: Female ENDOSCOPIST: Charlott Rakes, MD REFERRED [id number] Laural Benes, M.D. PROCEDURE DATE:  06/02/2013 PROCEDURE:   Colonoscopy with snare polypectomy ASA CLASS:   Class II INDICATIONS:Rectal Bleeding. MEDICATIONS: See Anesthesia Report.  DESCRIPTION OF PROCEDURE:   After the risks benefits and alternatives of the procedure were thoroughly explained, informed consent was obtained.  The Pentax Ped Colon [id number]  endoscope was introduced through the anus and advanced to the cecum, which was identified by both the appendix and ileocecal valve , limited by No adverse events experienced.   The quality of the prep was good. . The instrument was then slowly withdrawn as the colon was fully examined.     FINDINGS:  Rectal exam unremarkable.  Pediatric colonoscope inserted into the colon and advanced to the cecum, where the appendiceal orifice and ileocecal valve were identified.   During insertion several polyps were noted as stated below. A large semi-sessile polyp was seen in the ascending colon that was biopsied and tattooed.  In the descending colon a 6 mm sessile polyp was removed with snare cautery. In the sigmoid colon a 5 mm and a 7 mm sessile polyp were removed with snare cautery. A pedunculated polyp was seen in the distal sigmoid colon and removed with snare cautery. Diffuse small and large diverticuli noted in sigmoid colon. A large pedunculated polyp seen in the rectosigmoid colon that was removed with snare cautery.    Retroflexion revealed medium-sized internal hemorrhoids.  COMPLICATIONS: None  IMPRESSION:     Colon polyps X 5 - s/p removal X 5 Large ascending colon polyp - s/p biopsies and tattooing Sigmoid diverticulosis Internal hemorrhoids  RECOMMENDATIONS: F/U on path; No  aspirin products for 14 days; High fiber diet    ______________________________ eSignedCharlott Rakes, MD 06/02/2013 10:08 AM   [id number], [place] MD  PATIENT NAME:  Dana Roth, Dana Roth MR#: [id number]

## 2013-06-02 NOTE — Anesthesia Postprocedure Evaluation (Signed)
  Anesthesia Post-op Note  Patient: Dana Roth  Procedure(s) Performed: Procedure(s) (LRB): COLONOSCOPY WITH PROPOFOL (N/A)  Patient Location: PACU  Anesthesia Type: MAC  Level of Consciousness: awake and alert   Airway and Oxygen Therapy: Patient Spontanous Breathing  Post-op Pain: mild  Post-op Assessment: Post-op Vital signs reviewed, Patient's Cardiovascular Status Stable, Respiratory Function Stable, Patent Airway and No signs of Nausea or vomiting  Last Vitals:  Filed Vitals:   06/02/13 1020  BP: 126/68  Pulse: 75  Temp:   Resp: 16    Post-op Vital Signs: stable   Complications: No apparent anesthesia complications

## 2013-06-02 NOTE — H&P (Signed)
  Date of Initial H&P: 05/27/13  History reviewed, patient examined, no change in status, stable for surgery.

## 2013-06-03 ENCOUNTER — Encounter (HOSPITAL_COMMUNITY): Payer: Self-pay | Admitting: Gastroenterology

## 2013-06-07 ENCOUNTER — Ambulatory Visit: Payer: Medicaid Other | Attending: Internal Medicine

## 2013-06-07 NOTE — Progress Notes (Unsigned)
CSW Met with patient in order to continue discussing challenges with anxiety.  Patient identified that she has improved in her presentation of anxious symptoms which was evident during session today.  CSW processed today the challenges with family connections and personal isolations.  Patient constantly references separation from family but CSW worked to identify the strength in individual identify but the preservation of family relationship despite challenges.  CSW continues to reinforce positive attributes and self esteem.  Patient identified that she recognized and appreciated this level of support.  Patient began counseling with outside agency but desires to see this CSW biweekly.    Beverly Sessions MSW, LCSW Duration 40 minutes

## 2013-06-21 ENCOUNTER — Ambulatory Visit: Payer: Medicaid Other | Attending: Internal Medicine

## 2013-06-28 ENCOUNTER — Ambulatory Visit: Payer: Medicaid Other | Attending: Internal Medicine

## 2013-06-28 NOTE — Progress Notes (Unsigned)
CSW met with patient in order to continue addressing challenges with anxiety in social and medical situations.  CSW continues to work with patient on developing a positive self perception. Patient was open to this processing.  CSW processed three key areas in order to develop patient's ability to control and manage her anxiety: increased positive thinking, increase self value, and increase self power.  Patient identified that she could understand positive thinking from our previous work, but is still working to understand self value and self power.  CSW encouraged patient to practice these.  CSW has used patient's relationship with significant other as a major turning point in motivating work toward managing anxiety and depression.    Next session Dec 30th.  Beverly Sessions MSW, LCSW 45 min

## 2013-07-19 ENCOUNTER — Ambulatory Visit: Payer: Medicaid Other | Attending: Internal Medicine

## 2013-07-19 ENCOUNTER — Ambulatory Visit: Payer: Medicaid Other | Admitting: Internal Medicine

## 2013-07-19 NOTE — Progress Notes (Unsigned)
LCSW met with patient in order to continue processing challenges with mood and anxiety.  Patient has very poor self concept and has challenges with managing her emotions.  LCSW reviwed process of working through three major themes of treatment: Positive Thinking, Self Value and Self Power.  Patient is identifying that she is improving her ability to think positively because of the counseling work that we have done.  Patient continues to have challenges with self value.  LCSW was able to demonstrate with patient what positive self power looks like and how she has practiced it with low self value.  LCSW then contrasted that with high self value but same self power.  Patient could understand the difference and desires to work to make improvements.    LCSW will continue to work with patient on anxiety and mood.  Patient has begun trauma recovery work with outside agency.  LCSW will see this patient in 2 weeks.  Beverly Sessions MSW, LCSW Duration - 30 minutes

## 2013-08-02 ENCOUNTER — Other Ambulatory Visit: Payer: Medicaid Other

## 2013-08-16 ENCOUNTER — Ambulatory Visit: Payer: Medicaid Other | Attending: Internal Medicine | Admitting: *Deleted

## 2013-08-16 DIAGNOSIS — F411 Generalized anxiety disorder: Secondary | ICD-10-CM

## 2013-08-16 NOTE — Progress Notes (Signed)
LCSW met with patient in order to assess challenges with managing anxiety and decreasing depressive symptoms.  Patient has been referred to [facility] for trauma focused therapy and medication management.  Patient presented with a better mood and disposition and much fewer symptoms of anxiety on today.  Patient continues to identify the impact of positive thinking, but still having challenges with self value and empowerment.  LCSW recommended for patient to participate in some self reflection activity in order to identify the areas that she has exercised her personal value and power.  Patient could identify examples but was not willing to accept them as connected to her vaule and power.  LCSW will continue this work in conjunction with the trauma and med management.   Christene Lye MSW, LCSW Duration - 30 minutes

## 2013-08-29 ENCOUNTER — Ambulatory Visit: Payer: Medicaid Other | Attending: Internal Medicine | Admitting: *Deleted

## 2013-08-29 DIAGNOSIS — F411 Generalized anxiety disorder: Secondary | ICD-10-CM

## 2013-08-29 NOTE — Progress Notes (Signed)
LCSW met with patient in the midst of high anxiety.  Patient stated that she is anxious because she has an upcoming colonoscopy and is required to have some other lifestyle changes.  Patient states that she is also having difficulty dealing with everyday frustrations.  LCSW encouraged patient to focus attention on the strengths of her present and past to reduce her anxiety.  Patient was very reluctant but after some time our conversation shifted to focus on those things and patient quickly began to manage her mood and anxiety.  LCSW continued to discuss esteem building and support. Patient's mood lifted noticeably by the time we closed our session, but she still acknowledges that her anxiety is difficult to manage.    Next appointment in 2 weeks.  Christene Lye MSW, LCSW Duration - 45 minutes

## 2013-08-30 ENCOUNTER — Other Ambulatory Visit: Payer: Medicaid Other

## 2013-09-12 ENCOUNTER — Ambulatory Visit: Payer: Medicaid Other | Attending: Internal Medicine | Admitting: *Deleted

## 2013-09-13 ENCOUNTER — Other Ambulatory Visit: Payer: Medicaid Other

## 2013-09-26 ENCOUNTER — Other Ambulatory Visit: Payer: Medicaid Other

## 2013-10-10 ENCOUNTER — Ambulatory Visit: Payer: Medicaid Other | Attending: Internal Medicine | Admitting: *Deleted

## 2013-10-10 DIAGNOSIS — F411 Generalized anxiety disorder: Secondary | ICD-10-CM

## 2013-10-10 NOTE — Progress Notes (Signed)
LCSW met with patient in order to continue processing challenges with anxiety and self esteem.  Patient states that she is doing well and recognizes the improvement in her presentation in some areas, but also feels that there are several other areas that she needs to grow in. Patient states that she has challenges working with others and trusting new people and starting new relationships.  LCSW processed with patient reevaluating relationships and connecting to new people.  LCSW continues to work reaffirming patient but patient finds affirmation challenging. Next appointment in 2 weeks.  Christene Lye MSW, LCSW Duration 45 min

## 2013-10-24 ENCOUNTER — Ambulatory Visit: Payer: Medicaid Other | Attending: Internal Medicine | Admitting: *Deleted

## 2013-10-24 DIAGNOSIS — F411 Generalized anxiety disorder: Secondary | ICD-10-CM

## 2013-10-24 NOTE — Progress Notes (Signed)
LCSW met with patient in order to continue working on patient's anxiety.  Patient stated that LCSW's attempt to reduce sessions was offensive and she was dealing with her emotions around stepping down.  LCSW processed this with patient and patient stated that she continued to have challenges that she needed ongoing work to manage.  LCSW shared with patient that we can continue to work on additional goals as patient continues to make progress.  Central challenge to the therapeutic process is that patient has difficulty acknowledging progress.   LCSW will see patient biweekly at this time.  Christene Lye MSW, LCSW Duration 45 minutes

## 2013-11-07 ENCOUNTER — Ambulatory Visit: Payer: Medicaid Other | Attending: Internal Medicine | Admitting: *Deleted

## 2013-11-07 DIAGNOSIS — F411 Generalized anxiety disorder: Secondary | ICD-10-CM

## 2013-11-07 NOTE — Progress Notes (Signed)
LCSW met with patient in order to process challenges with anxiety that still persist.  Patient has some challenges with family conflicts due to a complicated and challenging family history.  Patient stated that today she was especially anxious because she was going to see one of her brothers.  LCSW processed with patient safety, support and referencing pervious visit.  Patient stated that this was helpful and that she was looking forward to the visit and felt hopeful.    Follow up in two weeks.  Christene Lye, MSW, LCSW Duration 45 minutes

## 2013-11-21 ENCOUNTER — Other Ambulatory Visit: Payer: Medicaid Other

## 2013-12-05 ENCOUNTER — Other Ambulatory Visit: Payer: Medicaid Other

## 2013-12-19 ENCOUNTER — Ambulatory Visit: Payer: Medicaid Other | Attending: Internal Medicine | Admitting: *Deleted

## 2013-12-19 DIAGNOSIS — F411 Generalized anxiety disorder: Secondary | ICD-10-CM

## 2013-12-19 NOTE — Progress Notes (Signed)
LCSW met with patient in order to continue work on managing mood and anxiety.  Patient stated that she has been prescribed medication from her psychiatrist in order to help her mood and anxiety and she has been doing very well with those medications. Patient also stated that she is seeing her primary therapist and will continue working with her.  This LCSW's primary goal was transitioning the patient to the appropriate level of mental health care.  Patient stated that she no longer needed this LCSW for ongoing work but felt that the transition was successful.  Patient will follow up as needed.  Christene Lye MSW, LCSW Duration 30 minutes

## 2014-01-18 ENCOUNTER — Other Ambulatory Visit: Payer: Medicaid Other

## 2014-01-23 ENCOUNTER — Ambulatory Visit: Payer: Medicaid Other | Attending: Internal Medicine | Admitting: *Deleted

## 2014-01-23 DIAGNOSIS — F410 Panic disorder [episodic paroxysmal anxiety] without agoraphobia: Secondary | ICD-10-CM

## 2014-01-23 NOTE — Progress Notes (Signed)
LCSW met with patient in order to close services. Patient and LCSW reviewed progress and and celebrated patient successes.  Patient stated that she is thankful for the support and will continue to pursue progress in other areas.  LCSW stated that they are available for further support if and when needed.  Christene Lye MSW, LCSW Duration 30 minutes

## 2014-11-17 ENCOUNTER — Ambulatory Visit (HOSPITAL_BASED_OUTPATIENT_CLINIC_OR_DEPARTMENT_OTHER): Payer: Medicaid Other | Admitting: Family Medicine

## 2014-11-17 ENCOUNTER — Encounter: Payer: Self-pay | Admitting: Family Medicine

## 2014-11-17 ENCOUNTER — Ambulatory Visit: Payer: Medicaid Other

## 2014-11-17 ENCOUNTER — Ambulatory Visit (HOSPITAL_COMMUNITY)
Admission: RE | Admit: 2014-11-17 | Discharge: 2014-11-17 | Disposition: A | Discharge status: Home / Self Care | Payer: Medicaid Other | Source: Ambulatory Visit | Attending: Family Medicine | Admitting: Family Medicine

## 2014-11-17 VITALS — BP 118/85 | HR 93 | Temp 98.1°F | Resp 18 | Ht 66.0 in | Wt 176.2 lb

## 2014-11-17 DIAGNOSIS — M25531 Pain in right wrist: Secondary | ICD-10-CM | POA: Diagnosis present

## 2014-11-17 NOTE — Patient Instructions (Signed)
Wear wrist support until you hear from [place] about the x-ray. If needed we will prescribe a wrist splint. Warm compresses to wrist Follow-up with scheduled appointment. Will want to do bloodwork at that time.

## 2014-11-17 NOTE — Progress Notes (Signed)
Patient walked-in complaining of right wrist, thumb swelling and pain x2 weeks. No injury to wrist. Patient feels a popping in her right wrist with movement. Patient has pain with movement-currently 7/10 with movement. Patient using an ACE bandage intermittently for pain, swelling relief. Patient says she takes a "yellow pill for anxiety (375 mg daily).  Sharon Seller, NP notified.

## 2014-11-17 NOTE — Progress Notes (Signed)
Subjective:     Patient ID: Dana Roth, female   DOB: [birth year], 54 y.o.   MRN: [id number]   Dana Roth, is a 54 y.o. female  [id number]    DOB - Jan 31, 1961  CC:  Right wrist pain          Allergies  Allergen Reactions  . Codeine     Past Medical History  Diagnosis Date  . Rectal bleeding   . GERD (gastroesophageal reflux disease)   . Needle phobia   . Depression   . Dyslipidemia   . Suspicious mole   . Multiple osteochondroma     Multiple bone surgeries  . Complication of anesthesia     problem with B/P dropping  . HTN (hypertension)     "only with MD visits, caused by anxiety",med taken caused chest pain-so stopped.  Marland Kitchen PONV (postoperative nausea and vomiting)    Aviv's family history includes CAD in her sister; Heart disease in her brother.  History   Social History  . Marital Status: Divorced    Spouse Name: N/A  . Number of Children: N/A  . Years of Education: N/A   Occupational History  .      [employer]   Social History Main Topics  . Smoking status: Current Every Day Smoker -- 1.00 packs/day for 40 years    Types: Cigarettes  . Smokeless tobacco: Not on file  . Alcohol Use: No  . Drug Use: 7.00 per week    Special: Marijuana     Comment: pt smoke marijuana everyday, last used 06/01/13  . Sexual Activity: Not on file   Other Topics Concern  . Not on file   Social History Narrative  . No narrative on file   Past Surgical History  Procedure Laterality Date  . Cholecystectomy    . Bone tumors      Multiple excisions-all over body "occ. bones lock up"  . Colonoscopy with propofol N/A 06/02/2013    Procedure: COLONOSCOPY WITH PROPOFOL;  Surgeon: Lear Ng, MD;  Location: [facility];  Service: Endoscopy;  Laterality: N/A;   HPI  Patient presents with a two week history of right wrist pain and swelling. She is aware of no specific injury. She does use her right hand and wrist extensively at work. She has a  history of osteochondroma and has had several bone surgeries. She has not previously had a problem with this wrist. She has been wearing an ACE bandage and using iburofen which she feels is not helping. She has had no routine health care here in over a year. She has a history of hypertension and refused treatment and she felt it was related to white coat syndrome and her anxiety. Her only medications currently are  Aspirin and something ? For anxiety.  She has an appointment here on May 19th. She has a history of colon polyps and rectal bleening. Has been evaluated and treated by GI.She has a history of dsylipidemia but has redined treatment   ROS: :  GEN:   Denies fever, chills,WT loss, loss of appetite Skin:   Denies lesions or rashes HENT:   Denies  earache, epistaxis, sore throat, or neck pain, has occassional head relieve with OTC medicine EYES:   Denies eye pain or drainage.Eyes feel sticky a lot                LUNGS:  Denies SOB with rest or walking; couging, choking CV:   Denies CP or palpitations ABD:  Denies,nausea,and vomiting, diarrhea. Does have occassional intestinal cramping.            EXT:    Denies muscle spasms or swelling; no pain in lower ext, no weakness NEURO:   Denies numbness or tingling, denies seizures  Objective:  There were no vitals filed for this visit.  Physical Exam:  General:  Alert, oriented, apprpriate in no acute distress Skin:   Warm and dry, no significant rashes or lesions HEENT:  Normocephalic, atraumatic, no pallor, no icterus, moist oral mucosa,  Neck:   Supple FROM w/o adenopathy, tenderness, or thyroidomegaly. Heart:   Normal  RRR,without M,G,R Lungs:   Clear to auscultation bilaterally. No increase in respiratory effort. Abdomen:  Soft, nontender, nondistended, positive bowel sounds. Exetremeties:  No pedal edema. There is swelling and tenderness on the lateral aspect of the right thumb and wrist. Neuro:   Non-focal.  Pertinent Lab  Results:     No results found for: HGBA1C  Medications: Prior to Admission medications   Medication Sig Start Date End Date Taking? Authorizing Provider  aspirin EC 81 MG tablet Take 81 mg by mouth daily.    Historical Provider, MD  LORazepam (ATIVAN) 0.5 MG tablet Take 0.5 mg by mouth every 6 (six) hours as needed for anxiety.    Historical Provider, MD    Assessment: 1. Wrist pain   Plan: 1. X-ray of right wrist.  Follow up:  X-ray shows no acute bony injury. There are arthritic changes. I suspect also tendenitis due to repetive motions on her job. Have advise wrist splint and avoiding repetitive motion.  The patient was given clear instructions to go to ER or return to medical center if symptoms don't improve, worsen or new problems develop. The patient verbalized understanding.     Micheline Chapman, FNP,BC 11/17/2014, 11:01 AM  HPI   Review of Systems     Objective:   Physical Exam     Assessment:      Plan:

## 2014-11-20 ENCOUNTER — Other Ambulatory Visit: Payer: Self-pay | Admitting: Family Medicine

## 2014-11-20 ENCOUNTER — Telehealth: Payer: Self-pay

## 2014-11-20 DIAGNOSIS — M779 Enthesopathy, unspecified: Secondary | ICD-10-CM

## 2014-11-20 NOTE — Telephone Encounter (Signed)
Left message informing patient to come to office to pick up wrist splint rx.  Order to front for patient.

## 2014-11-20 NOTE — Telephone Encounter (Signed)
Patient informed that wrist xray was normal.  Informed her a splint could help.  Patient says they are to expensive to buy OTC so she wants an rx for this.  She does have medicaid.

## 2014-11-28 IMAGING — CR DG CHEST 1V
1 series · 1 of 1 positions shown · non-contrast
Comparison: none

COMMENTS:

PROCEDURE:     DXR - DXR CHEST 1 VIEWAP OR PA  - December 10, 2012 [DATE]
RESULT:     Comparison: None

[w chest pa]
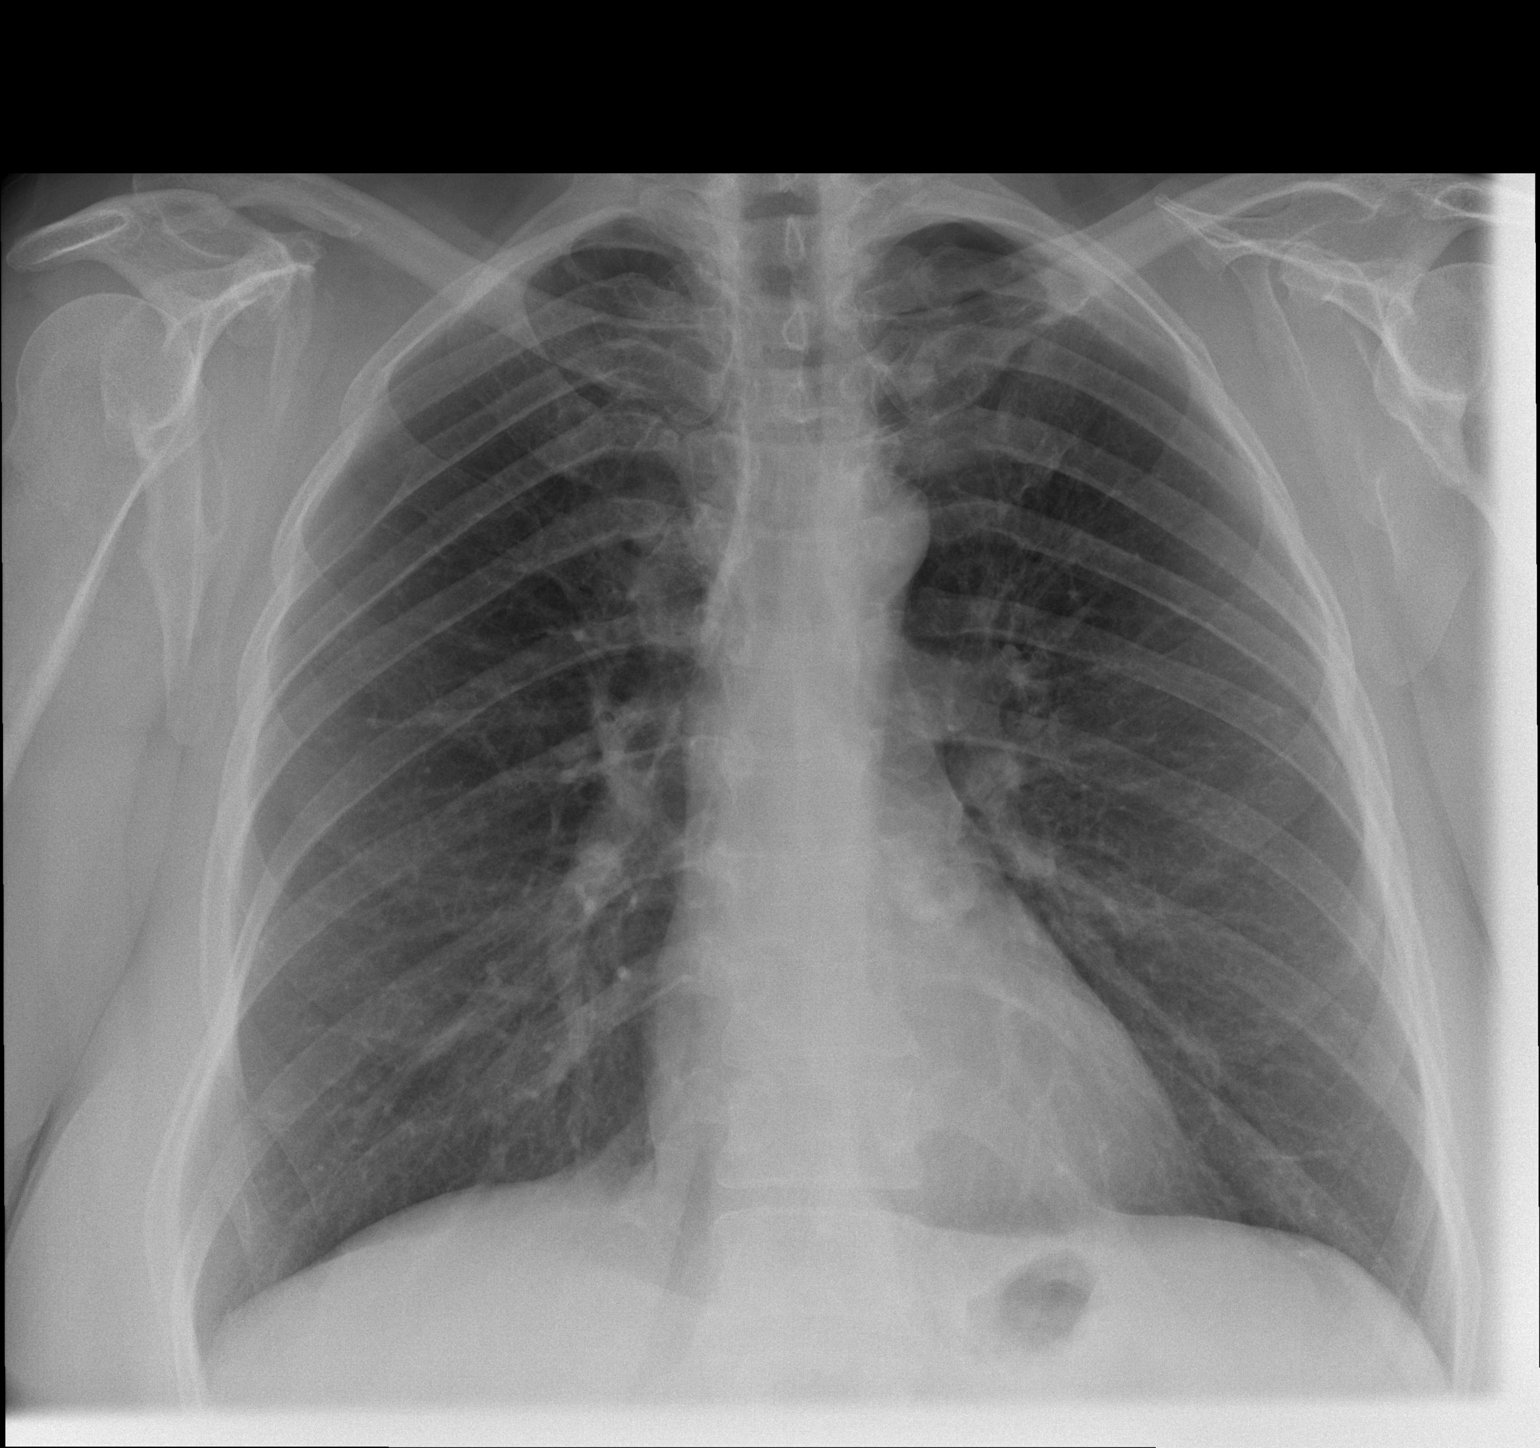

[1 of 1 positions shown; findings below may reference images not displayed]

FINDINGS: Single portable AP chest radiograph is provided.  There is no focal
parenchymal opacity, pleural effusion, or pneumothorax. Normal
cardiomediastinal silhouette. The osseous structures are unremarkable.
IMPRESSION: No acute disease of the che[REDACTED]

## 2014-12-07 ENCOUNTER — Encounter: Payer: Self-pay | Admitting: Internal Medicine

## 2014-12-07 ENCOUNTER — Ambulatory Visit: Payer: Medicaid Other | Attending: Internal Medicine | Admitting: Internal Medicine

## 2014-12-07 VITALS — BP 140/90 | HR 98 | Temp 98.0°F | Ht 64.0 in | Wt 176.0 lb

## 2014-12-07 DIAGNOSIS — Z720 Tobacco use: Secondary | ICD-10-CM | POA: Diagnosis not present

## 2014-12-07 DIAGNOSIS — F17200 Nicotine dependence, unspecified, uncomplicated: Secondary | ICD-10-CM

## 2014-12-07 DIAGNOSIS — M25531 Pain in right wrist: Secondary | ICD-10-CM

## 2014-12-07 DIAGNOSIS — M25539 Pain in unspecified wrist: Secondary | ICD-10-CM | POA: Insufficient documentation

## 2014-12-07 DIAGNOSIS — F411 Generalized anxiety disorder: Secondary | ICD-10-CM

## 2014-12-07 LAB — COMPLETE METABOLIC PANEL WITH GFR
ALK PHOS: 90 U/L (ref 39–117)
ALT: 21 U/L (ref 0–35)
AST: 18 U/L (ref 0–37)
Albumin: 4.3 g/dL (ref 3.5–5.2)
BILIRUBIN TOTAL: 0.6 mg/dL (ref 0.2–1.2)
BUN: 9 mg/dL (ref 6–23)
CO2: 26 mEq/L (ref 19–32)
CREATININE: 0.78 mg/dL (ref 0.50–1.10)
Calcium: 9.4 mg/dL (ref 8.4–10.5)
Chloride: 102 mEq/L (ref 96–112)
GFR, Est African American: >89 mL/min
GFR, Est Non African American: 87 mL/min
Glucose, Bld: 84 mg/dL (ref 70–99)
Potassium: 4.7 mEq/L (ref 3.5–5.3)
Sodium: 138 mEq/L (ref 135–145)
Total Protein: 7.2 g/dL (ref 6.0–8.3)

## 2014-12-07 LAB — LIPID PANEL
Cholesterol: 231 mg/dL — ABNORMAL HIGH (ref 0–200)
HDL: 33 mg/dL — ABNORMAL LOW (ref >=46)
LDL CALC: 167 mg/dL — AB (ref 0–99)
Total CHOL/HDL Ratio: 7 Ratio
Triglycerides: 156 mg/dL — ABNORMAL HIGH (ref <150)
VLDL: 31 mg/dL (ref 0–40)

## 2014-12-07 LAB — CBC WITH DIFFERENTIAL/PLATELET
Basophils Absolute: 0 10*3/uL (ref 0.0–0.1)
Basophils Relative: 0 % (ref 0–1)
Eosinophils Absolute: 0.2 10*3/uL (ref 0.0–0.7)
Eosinophils Relative: 2 % (ref 0–5)
HEMATOCRIT: 44.1 % (ref 36.0–46.0)
HEMOGLOBIN: 15 g/dL (ref 12.0–15.0)
LYMPHS PCT: 24 % (ref 12–46)
Lymphs Abs: 2 10*3/uL (ref 0.7–4.0)
MCH: 29.8 pg (ref 26.0–34.0)
MCHC: 34 g/dL (ref 30.0–36.0)
MCV: 87.7 fL (ref 78.0–100.0)
MONO ABS: 0.6 10*3/uL (ref 0.1–1.0)
MPV: 11.4 fL (ref 8.6–12.4)
Monocytes Relative: 7 % (ref 3–12)
NEUTROS ABS: 5.6 10*3/uL (ref 1.7–7.7)
Neutrophils Relative %: 67 % (ref 43–77)
Platelets: 184 10*3/uL (ref 150–400)
RBC: 5.03 MIL/uL (ref 3.87–5.11)
RDW: 13.5 % (ref 11.5–15.5)
WBC: 8.3 10*3/uL (ref 4.0–10.5)

## 2014-12-07 LAB — TSH: TSH: 1.264 u[IU]/mL (ref 0.350–4.500)

## 2014-12-07 LAB — POCT GLYCOSYLATED HEMOGLOBIN (HGB A1C): HEMOGLOBIN A1C: 5.7

## 2014-12-07 MED ORDER — NAPROXEN 500 MG PO TABS: 500.0000 mg | ORAL_TABLET | Freq: Two times a day (BID) | ORAL | Status: DC

## 2014-12-07 NOTE — Patient Instructions (Signed)
Smoking Cessation Quitting smoking is important to your health and has many advantages. However, it is not always easy to quit since nicotine is a very addictive drug. Oftentimes, people try 3 times or more before being able to quit. This document explains the best ways for you to prepare to quit smoking. Quitting takes hard work and a lot of effort, but you can do it. ADVANTAGES OF QUITTING SMOKING  You will live longer, feel better, and live better.  Your body will feel the impact of quitting smoking almost immediately.  Within 20 minutes, blood pressure decreases. Your pulse returns to its normal level.  After 8 hours, carbon monoxide levels in the blood return to normal. Your oxygen level increases.  After 24 hours, the chance of having a heart attack starts to decrease. Your breath, hair, and body stop smelling like smoke.  After 48 hours, damaged nerve endings begin to recover. Your sense of taste and smell improve.  After 72 hours, the body is virtually free of nicotine. Your bronchial tubes relax and breathing becomes easier.  After 2 to 12 weeks, lungs can hold more air. Exercise becomes easier and circulation improves.  The risk of having a heart attack, stroke, cancer, or lung disease is greatly reduced.  After 1 year, the risk of coronary heart disease is cut in half.  After 5 years, the risk of stroke falls to the same as a nonsmoker.  After 10 years, the risk of lung cancer is cut in half and the risk of other cancers decreases significantly.  After 15 years, the risk of coronary heart disease drops, usually to the level of a nonsmoker.  If you are pregnant, quitting smoking will improve your chances of having a healthy baby.  The people you live with, especially any children, will be healthier.  You will have extra money to spend on things other than cigarettes. QUESTIONS TO THINK ABOUT BEFORE ATTEMPTING TO QUIT You may want to talk about your answers with your  health care provider.  Why do you want to quit?  If you tried to quit in the past, what helped and what did not?  What will be the most difficult situations for you after you quit? How will you plan to handle them?  Who can help you through the tough times? Your family? Friends? A health care provider?  What pleasures do you get from smoking? What ways can you still get pleasure if you quit? Here are some questions to ask your health care provider:  How can you help me to be successful at quitting?  What medicine do you think would be best for me and how should I take it?  What should I do if I need more help?  What is smoking withdrawal like? How can I get information on withdrawal? GET READY  Set a quit date.  Change your environment by getting rid of all cigarettes, ashtrays, matches, and lighters in your home, car, or work. Do not let people smoke in your home.  Review your past attempts to quit. Think about what worked and what did not. GET SUPPORT AND ENCOURAGEMENT You have a better chance of being successful if you have help. You can get support in many ways.  Tell your family, friends, and coworkers that you are going to quit and need their support. Ask them not to smoke around you.  Get individual, group, or telephone counseling and support. Programs are available at local hospitals and health centers. Call   your local health department for information about programs in your area.  Spiritual beliefs and practices may help some smokers quit.  Download a "quit meter" on your computer to keep track of quit statistics, such as how long you have gone without smoking, cigarettes not smoked, and money saved.  Get a self-help book about quitting smoking and staying off tobacco. Newcastle yourself from urges to smoke. Talk to someone, go for a walk, or occupy your time with a task.  Change your normal routine. Take a different route to work.  Drink tea instead of coffee. Eat breakfast in a different place.  Reduce your stress. Take a hot bath, exercise, or read a book.  Plan something enjoyable to do every day. Reward yourself for not smoking.  Explore interactive web-based programs that specialize in helping you quit. GET MEDICINE AND USE IT CORRECTLY Medicines can help you stop smoking and decrease the urge to smoke. Combining medicine with the above behavioral methods and support can greatly increase your chances of successfully quitting smoking.  Nicotine replacement therapy helps deliver nicotine to your body without the negative effects and risks of smoking. Nicotine replacement therapy includes nicotine gum, lozenges, inhalers, nasal sprays, and skin patches. Some may be available over-the-counter and others require a prescription.  Antidepressant medicine helps people abstain from smoking, but how this works is unknown. This medicine is available by prescription.  Nicotinic receptor partial agonist medicine simulates the effect of nicotine in your brain. This medicine is available by prescription. Ask your health care provider for advice about which medicines to use and how to use them based on your health history. Your health care provider will tell you what side effects to look out for if you choose to be on a medicine or therapy. Carefully read the information on the package. Do not use any other product containing nicotine while using a nicotine replacement product.  RELAPSE OR DIFFICULT SITUATIONS Most relapses occur within the first 3 months after quitting. Do not be discouraged if you start smoking again. Remember, most people try several times before finally quitting. You may have symptoms of withdrawal because your body is used to nicotine. You may crave cigarettes, be irritable, feel very hungry, cough often, get headaches, or have difficulty concentrating. The withdrawal symptoms are only temporary. They are strongest  when you first quit, but they will go away within 10-14 days. To reduce the chances of relapse, try to:  Avoid drinking alcohol. Drinking lowers your chances of successfully quitting.  Reduce the amount of caffeine you consume. Once you quit smoking, the amount of caffeine in your body increases and can give you symptoms, such as a rapid heartbeat, sweating, and anxiety.  Avoid smokers because they can make you want to smoke.  Do not let weight gain distract you. Many smokers will gain weight when they quit, usually less than 10 pounds. Eat a healthy diet and stay active. You can always lose the weight gained after you quit.  Find ways to improve your mood other than smoking. FOR MORE INFORMATION  www.smokefree.gov  Document Released: 07/01/2001 Document Revised: 11/21/2013 Document Reviewed: 10/16/2011 [facility] Patient Information 2015 [place], [place]. This information is not intended to replace advice given to you by your health care provider. Make sure you discuss any questions you have with your health care provider. Wrist Pain Wrist injuries are frequent in adults and children. A sprain is an injury to the ligaments that hold your bones  together. A strain is an injury to muscle or muscle cord-like structures (tendons) from stretching or pulling. Generally, when wrists are moderately tender to touch following a fall or injury, a break in the bone (fracture) may be present. Most wrist sprains or strains are better in 3 to 5 days, but complete healing may take several weeks. HOME CARE INSTRUCTIONS   Put ice on the injured area.  Put ice in a plastic bag.  Place a towel between your skin and the bag.  Leave the ice on for 15-20 minutes, 3-4 times a day, for the first 2 days, or as directed by your health care provider.  Keep your arm raised above the level of your heart whenever possible to reduce swelling and pain.  Rest the injured area for at least 48 hours or as directed by your  health care provider.  If a splint or elastic bandage has been applied, use it for as long as directed by your health care provider or until seen by a health care provider for a follow-up exam.  Only take over-the-counter or prescription medicines for pain, discomfort, or fever as directed by your health care provider.  Keep all follow-up appointments. You may need to follow up with a specialist or have follow-up X-rays. Improvement in pain level is not a guarantee that you did not fracture a bone in your wrist. The only way to determine whether or not you have a broken bone is by X-ray. SEEK IMMEDIATE MEDICAL CARE IF:   Your fingers are swollen, very red, white, or cold and blue.  Your fingers are numb or tingling.  You have increasing pain.  You have difficulty moving your fingers. MAKE SURE YOU:   Understand these instructions.  Will watch your condition.  Will get help right away if you are not doing well or get worse. Document Released: 04/16/2005 Document Revised: 07/12/2013 Document Reviewed: 08/28/2010 [facility] Patient Information 2015 [place], [place]. This information is not intended to replace advice given to you by your health care provider. Make sure you discuss any questions you have with your health care provider.

## 2014-12-07 NOTE — Progress Notes (Signed)
Patient ID: Dana Roth, female   DOB: April 22, 1961, 54 y.o.   MRN: [id number]   Dana Roth, is a 54 y.o. female  [id number]:[id number]  [id number]  DOB - 04-21-1961  Chief Complaint  Patient presents with  . Wrist Pain        Subjective:   Dana Roth is a 54 y.o. female here today for a follow up visit.  Patient was seen here recently for right wrist pain and swelling with no inciting injury. She was sent for x-ray which showed no acute bone injury. Patient was advised also symptomatic treatment an over-the-counter pain medication. Patient is here today for follow-up. Medical history significant for GERD, dyslipidemia, hypertension, multiple osteochondroma and major depression. Patient is followed up in the clinic by the licensed clinical social worker for generalized anxiety her major depression counseling. Patient has No headache, No chest pain, No abdominal pain - No Nausea, No new weakness tingling or numbness, No Cough - SOB.  Problem  Wrist Pain, Acute  Generalized Anxiety Disorder    ALLERGIES: Allergies  Allergen Reactions  . Codeine     PAST MEDICAL HISTORY: Past Medical History  Diagnosis Date  . Rectal bleeding   . GERD (gastroesophageal reflux disease)   . Needle phobia   . Depression   . Dyslipidemia   . Suspicious mole   . Multiple osteochondroma     Multiple bone surgeries  . Complication of anesthesia     problem with B/P dropping  . HTN (hypertension)     "only with MD visits, caused by anxiety",med taken caused chest pain-so stopped.  Marland Kitchen PONV (postoperative nausea and vomiting)     MEDICATIONS AT HOME: Prior to Admission medications   Medication Sig Start Date End Date Taking? Authorizing Provider  aspirin EC 81 MG tablet Take 81 mg by mouth daily.   Yes Historical Provider, MD  venlafaxine (EFFEXOR) 75 MG tablet Take 75 mg by mouth 3 (three) times daily with meals.   Yes Historical Provider, MD  LORazepam (ATIVAN) 0.5 MG tablet Take 0.5 mg by  mouth every 6 (six) hours as needed for anxiety.    Historical Provider, MD  naproxen (NAPROSYN) 500 MG tablet Take 1 tablet (500 mg total) by mouth 2 (two) times daily with a meal. 12/07/14   Tresa Garter, MD     Objective:   Filed Vitals:   12/07/14 0912  BP: 140/90  Pulse: 98  Temp: 98 F (36.7 C)  Height: 5\' 4"  (1.626 m)  Weight: 176 lb (79.833 kg)  SpO2: 99%    Exam General appearance : Awake, alert, not in any distress. Speech Clear. Not toxic looking HEENT: Atraumatic and Normocephalic, pupils equally reactive to light and accomodation Neck: supple, no JVD. No cervical lymphadenopathy.  Chest:Good air entry bilaterally, no added sounds  CVS: S1 S2 regular, no murmurs.  Abdomen: Bowel sounds present, Non tender and not distended with no gaurding, rigidity or rebound. Extremities: Slightly swollen right wrist, tender to touch and movement, no redness. B/L Lower Ext shows no edema, both legs are warm to touch.  Neurology: Awake alert, and oriented X 3, CN II-XII intact, Non focal   Data Review No results found for: HGBA1C   Assessment & Plan   1. Wrist pain, acute, right due to osteoarthritis  Right wrist x-ray reviewed by me, discussed with patient. - naproxen (NAPROSYN) 500 MG tablet; Take 1 tablet (500 mg total) by mouth 2 (two) times daily with a meal.  Dispense: 60 tablet;  Refill: 0 - Wrist splint  2. Generalized anxiety disorder  - CBC with Differential/Platelet - COMPLETE METABOLIC PANEL WITH GFR - POCT glycosylated hemoglobin (Hb A1C) - Lipid panel - TSH - Vit D  25 hydroxy (rtn osteoporosis monitoring)  3. Smoker  Dana Roth was counseled on the dangers of tobacco use, and was advised to quit. Reviewed strategies to maximize success, including removing cigarettes and smoking materials from environment, stress management and support of family/friends.  Patient have been counseled extensively about nutrition and exercise  Return in about 6 months  (around 06/09/2015), or if symptoms worsen or fail to improve, for Follow up Pain and comorbidities, Annual Physical.  The patient was given clear instructions to go to ER or return to medical center if symptoms don't improve, worsen or new problems develop. The patient verbalized understanding. The patient was told to call to get lab results if they haven't heard anything in the next week.   This note has been created with Surveyor, quantity. Any transcriptional errors are unintentional.    Angelica Chessman, MD, [place], [place], [place], [place] and [place], [place]   12/07/2014, 9:50 AM

## 2014-12-07 NOTE — Progress Notes (Signed)
Patient here to discuss her right wrist pain and xray She also says she is due to give blood but is not sure for what

## 2014-12-08 LAB — VITAMIN D 25 HYDROXY (VIT D DEFICIENCY, FRACTURES): Vit D, 25-Hydroxy: 18 ng/mL — ABNORMAL LOW (ref 30–100)

## 2014-12-15 ENCOUNTER — Other Ambulatory Visit: Payer: Self-pay | Admitting: Internal Medicine

## 2014-12-15 ENCOUNTER — Telehealth: Payer: Self-pay | Admitting: *Deleted

## 2014-12-15 MED ORDER — PRAVASTATIN SODIUM 40 MG PO TABS: 40.0000 mg | ORAL_TABLET | Freq: Every day | ORAL | Status: DC

## 2014-12-15 NOTE — Telephone Encounter (Signed)
Gave results to patient

## 2014-12-15 NOTE — Telephone Encounter (Signed)
-----   Message from Tresa Garter, MD sent at 12/15/2014  3:49 PM EDT ----- Please inform patient that laboratory test results are mostly within normal except for cholesterol level that is high. We will start her on cholesterol medication and also to address this please limit saturated fat to no more than 7% of your calories, limit cholesterol to 200 mg/day, increase fiber and exercise as tolerated. I have eprescribed cholesterol medicine to her pharmacy at [facility]

## 2015-01-13 ENCOUNTER — Other Ambulatory Visit: Payer: Self-pay | Admitting: Internal Medicine

## 2015-01-29 NOTE — Telephone Encounter (Signed)
[facility] called requesting a refill on naproxen 500 mg for this patient. A refill request was ordered on 01/13/15 for this medication, but it still shows it is pending.

## 2015-04-10 ENCOUNTER — Other Ambulatory Visit: Payer: Self-pay

## 2015-04-10 DIAGNOSIS — M25531 Pain in right wrist: Secondary | ICD-10-CM

## 2015-04-10 MED ORDER — NAPROXEN 500 MG PO TABS: 500.0000 mg | ORAL_TABLET | Freq: Two times a day (BID) | ORAL | Status: DC

## 2015-06-03 ENCOUNTER — Other Ambulatory Visit: Payer: Self-pay | Admitting: Internal Medicine

## 2015-11-15 DIAGNOSIS — M79604 Pain in right leg: Secondary | ICD-10-CM | POA: Insufficient documentation

## 2015-11-27 ENCOUNTER — Other Ambulatory Visit: Payer: Self-pay | Admitting: Physical Medicine and Rehabilitation

## 2015-11-27 DIAGNOSIS — M79604 Pain in right leg: Secondary | ICD-10-CM

## 2015-12-04 ENCOUNTER — Ambulatory Visit
Admission: RE | Admit: 2015-12-04 | Discharge: 2015-12-04 | Disposition: A | Discharge status: Home / Self Care | Payer: Medicaid Other | Source: Ambulatory Visit | Attending: Physical Medicine and Rehabilitation | Admitting: Physical Medicine and Rehabilitation

## 2015-12-04 DIAGNOSIS — M79604 Pain in right leg: Secondary | ICD-10-CM

## 2016-05-26 DIAGNOSIS — M159 Polyosteoarthritis, unspecified: Secondary | ICD-10-CM | POA: Insufficient documentation

## 2016-05-26 DIAGNOSIS — R7689 Other specified abnormal immunological findings in serum: Secondary | ICD-10-CM | POA: Insufficient documentation

## 2016-05-26 DIAGNOSIS — R768 Other specified abnormal immunological findings in serum: Secondary | ICD-10-CM | POA: Insufficient documentation

## 2016-11-04 IMAGING — CR DG WRIST COMPLETE 3+V*R*
4 series · 4 of 4 positions shown · non-contrast
Comparison: None.

CLINICAL DATA: Acute right wrist pain without injury

EXAM:
RIGHT WRIST - COMPLETE 3+ VIEW

[x wrist pa right]
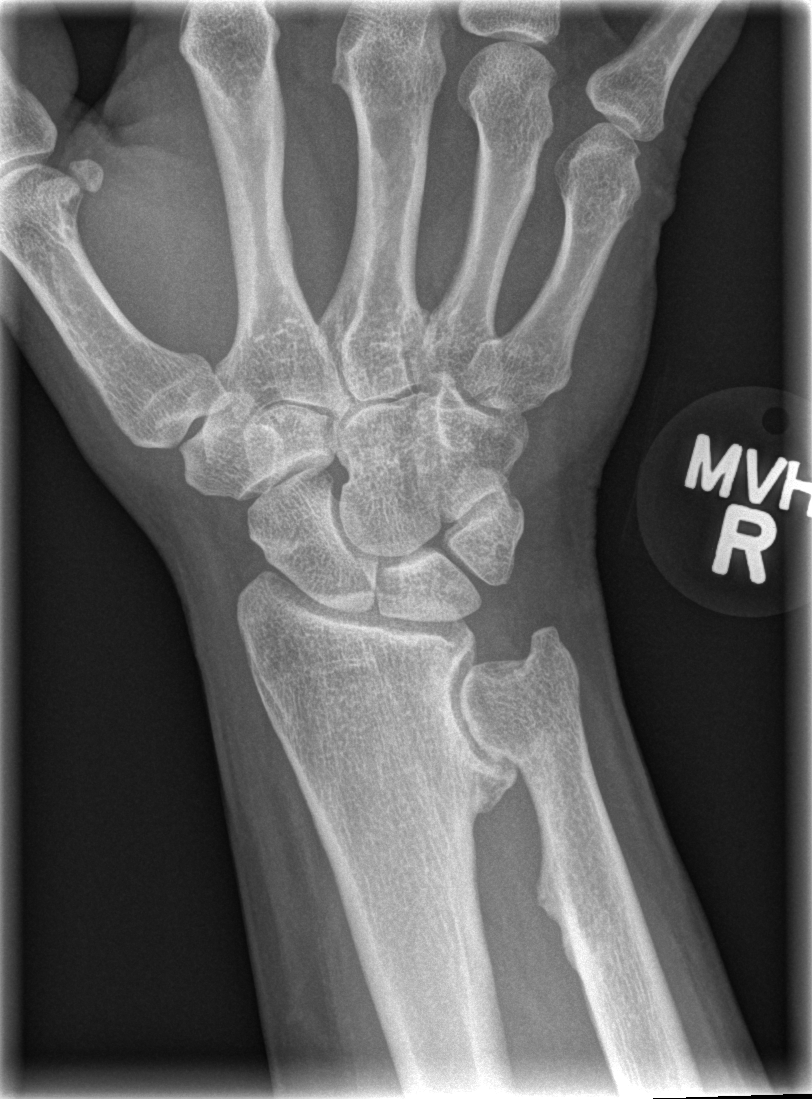

[x wrist obl right]
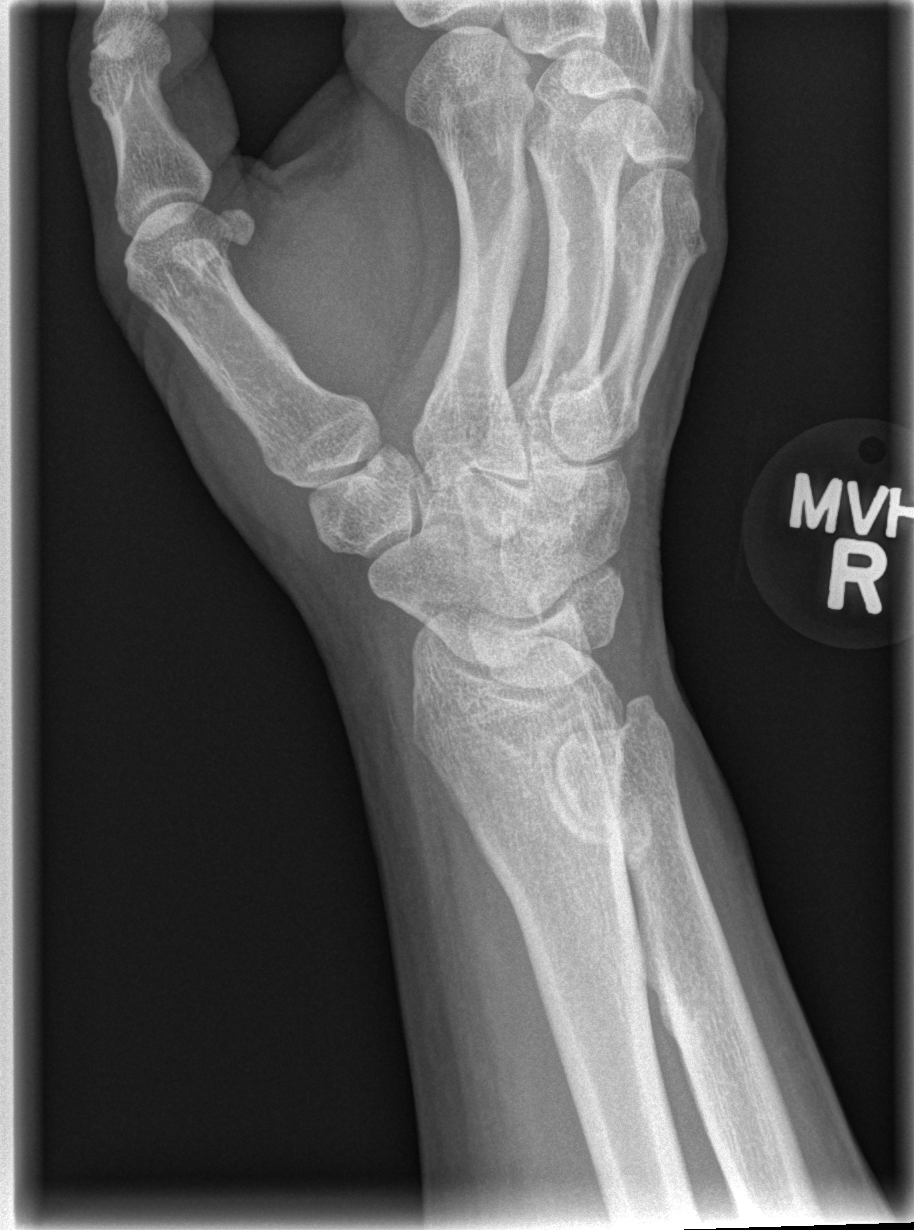

[x wrist lat right]
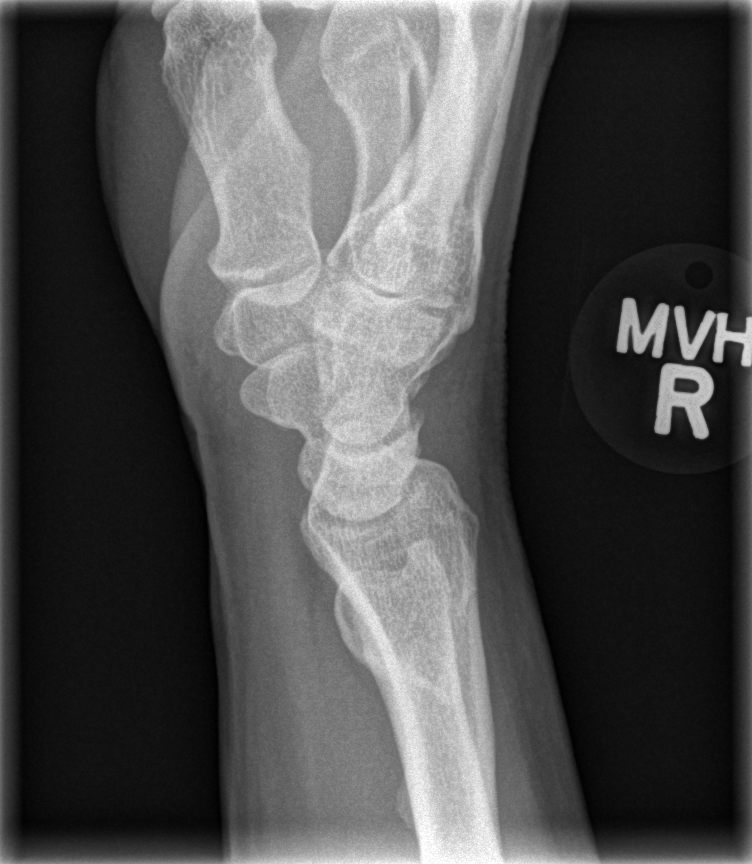

[x wrist navicular]
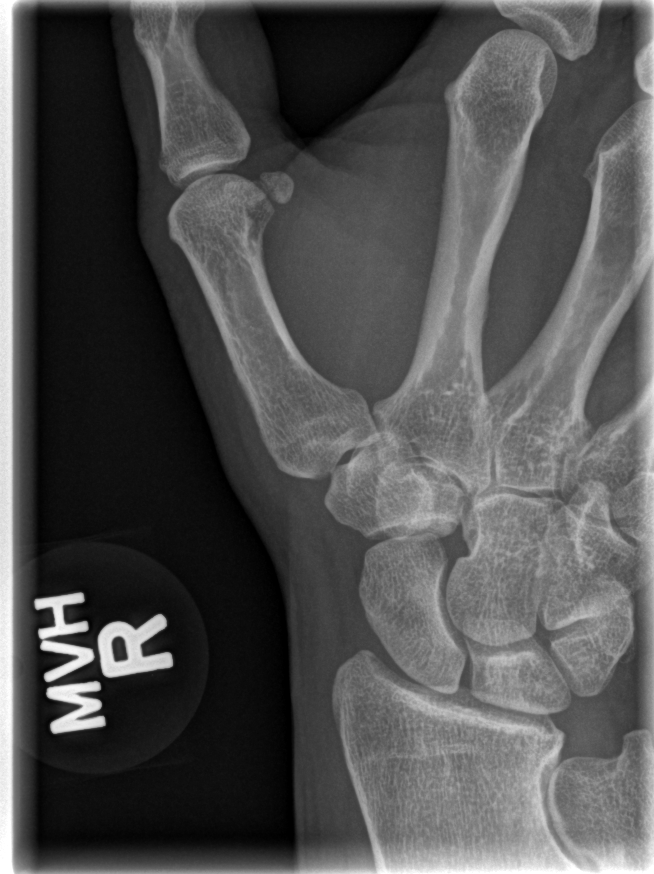

[4 of 4 positions shown; findings below may reference images not displayed]

FINDINGS: There is negative ulnar variance. Degenerative changes are
identified at the distal radial ulnar joint. There is no acute
fracture or subluxation identified.
IMPRESSION: 1. No acute bone abnormalities.
2. Negative ulnar variance with degenerative changes at the distal
radial ulnar joint.

## 2017-04-02 ENCOUNTER — Other Ambulatory Visit: Payer: Self-pay | Admitting: Gastroenterology

## 2017-04-29 ENCOUNTER — Other Ambulatory Visit: Payer: Self-pay | Admitting: Gastroenterology

## 2017-05-06 ENCOUNTER — Encounter (HOSPITAL_COMMUNITY): Payer: Self-pay | Admitting: *Deleted

## 2017-05-06 NOTE — Progress Notes (Signed)
Spoke with pt for pre-op call. Pt denies cardiac history, chest pain, sob and diabetes. Pt states she is very anxious about the IV stick with the procedure tomorrow morning.  EKG - 02/24/13 - in EPIC Stress - 03/22/13 - in EPIC

## 2017-05-07 ENCOUNTER — Ambulatory Visit (HOSPITAL_COMMUNITY)
Admission: RE | Admit: 2017-05-07 | Discharge: 2017-05-07 | Disposition: A | Discharge status: Home / Self Care | Payer: Medicaid Other | Source: Ambulatory Visit | Attending: Gastroenterology | Admitting: Gastroenterology

## 2017-05-07 ENCOUNTER — Encounter (HOSPITAL_COMMUNITY)
Admission: RE | Disposition: A | Discharge status: Home / Self Care | Payer: Self-pay | Source: Ambulatory Visit | Attending: Gastroenterology

## 2017-05-07 ENCOUNTER — Encounter (HOSPITAL_COMMUNITY): Payer: Self-pay | Admitting: *Deleted

## 2017-05-07 ENCOUNTER — Ambulatory Visit (HOSPITAL_COMMUNITY): Payer: Medicaid Other | Admitting: Certified Registered"

## 2017-05-07 DIAGNOSIS — Z6830 Body mass index (BMI) 30.0-30.9, adult: Secondary | ICD-10-CM | POA: Insufficient documentation

## 2017-05-07 DIAGNOSIS — Z79899 Other long term (current) drug therapy: Secondary | ICD-10-CM | POA: Diagnosis not present

## 2017-05-07 DIAGNOSIS — K640 First degree hemorrhoids: Secondary | ICD-10-CM | POA: Insufficient documentation

## 2017-05-07 DIAGNOSIS — F419 Anxiety disorder, unspecified: Secondary | ICD-10-CM | POA: Insufficient documentation

## 2017-05-07 DIAGNOSIS — F329 Major depressive disorder, single episode, unspecified: Secondary | ICD-10-CM | POA: Insufficient documentation

## 2017-05-07 DIAGNOSIS — E785 Hyperlipidemia, unspecified: Secondary | ICD-10-CM | POA: Insufficient documentation

## 2017-05-07 DIAGNOSIS — K219 Gastro-esophageal reflux disease without esophagitis: Secondary | ICD-10-CM | POA: Insufficient documentation

## 2017-05-07 DIAGNOSIS — Z86010 Personal history of colonic polyps: Secondary | ICD-10-CM | POA: Insufficient documentation

## 2017-05-07 DIAGNOSIS — Z860101 Personal history of adenomatous and serrated colon polyps: Secondary | ICD-10-CM

## 2017-05-07 DIAGNOSIS — Q439 Congenital malformation of intestine, unspecified: Secondary | ICD-10-CM | POA: Diagnosis not present

## 2017-05-07 DIAGNOSIS — Z791 Long term (current) use of non-steroidal anti-inflammatories (NSAID): Secondary | ICD-10-CM | POA: Insufficient documentation

## 2017-05-07 DIAGNOSIS — K635 Polyp of colon: Secondary | ICD-10-CM | POA: Diagnosis not present

## 2017-05-07 DIAGNOSIS — E669 Obesity, unspecified: Secondary | ICD-10-CM | POA: Diagnosis not present

## 2017-05-07 DIAGNOSIS — K5730 Diverticulosis of large intestine without perforation or abscess without bleeding: Secondary | ICD-10-CM | POA: Diagnosis not present

## 2017-05-07 DIAGNOSIS — E7800 Pure hypercholesterolemia, unspecified: Secondary | ICD-10-CM | POA: Diagnosis not present

## 2017-05-07 DIAGNOSIS — F17210 Nicotine dependence, cigarettes, uncomplicated: Secondary | ICD-10-CM | POA: Diagnosis not present

## 2017-05-07 DIAGNOSIS — Z1211 Encounter for screening for malignant neoplasm of colon: Secondary | ICD-10-CM | POA: Diagnosis not present

## 2017-05-07 DIAGNOSIS — I10 Essential (primary) hypertension: Secondary | ICD-10-CM | POA: Insufficient documentation

## 2017-05-07 DIAGNOSIS — Z7982 Long term (current) use of aspirin: Secondary | ICD-10-CM | POA: Insufficient documentation

## 2017-05-07 HISTORY — PX: HOT HEMOSTASIS: SHX5433

## 2017-05-07 HISTORY — PX: COLONOSCOPY WITH PROPOFOL: SHX5780

## 2017-05-07 HISTORY — DX: Unspecified osteoarthritis, unspecified site: M19.90

## 2017-05-07 SURGERY — COLONOSCOPY WITH PROPOFOL
Anesthesia: Monitor Anesthesia Care

## 2017-05-07 MED ORDER — LACTATED RINGERS IV SOLN
INTRAVENOUS | Status: DC | PRN
  Administered 2017-05-07: 08:00:00 via INTRAVENOUS

## 2017-05-07 MED ORDER — MIDAZOLAM HCL 5 MG/5ML IJ SOLN
INTRAMUSCULAR | Status: DC | PRN
  Administered 2017-05-07: 2 mg via INTRAVENOUS

## 2017-05-07 MED ORDER — SODIUM CHLORIDE 0.9 % IV SOLN: INTRAVENOUS | Status: DC

## 2017-05-07 MED ORDER — PROPOFOL 10 MG/ML IV BOLUS
INTRAVENOUS | Status: DC | PRN
Start: 2017-05-07 — End: 2017-05-07
  Administered 2017-05-07: 50 mg via INTRAVENOUS
  Administered 2017-05-07: 30 mg via INTRAVENOUS
  Administered 2017-05-07 (×2): 50 mg via INTRAVENOUS
  Administered 2017-05-07: 20 mg via INTRAVENOUS
  Administered 2017-05-07: 50 mg via INTRAVENOUS
  Administered 2017-05-07: 80 mg via INTRAVENOUS
  Administered 2017-05-07 (×2): 20 mg via INTRAVENOUS
  Administered 2017-05-07: 50 mg via INTRAVENOUS

## 2017-05-07 MED ORDER — LACTATED RINGERS IV SOLN
INTRAVENOUS | Status: AC | PRN
  Administered 2017-05-07: 1000 mL via INTRAVENOUS

## 2017-05-07 MED ORDER — LIDOCAINE 2% (20 MG/ML) 5 ML SYRINGE
INTRAMUSCULAR | Status: DC | PRN
  Administered 2017-05-07: 40 mg via INTRAVENOUS

## 2017-05-07 SURGICAL SUPPLY — 22 items

## 2017-05-07 NOTE — Anesthesia Procedure Notes (Signed)
Procedure Name: MAC Date/Time: 05/07/2017 8:50 AM Performed by: Orlie Dakin Pre-anesthesia Checklist: Patient identified, Emergency Drugs available, Suction available, Patient being monitored and Timeout performed Patient Re-evaluated:Patient Re-evaluated prior to induction Oxygen Delivery Method: Simple face mask Preoxygenation: Pre-oxygenation with 100% oxygen

## 2017-05-07 NOTE — Anesthesia Preprocedure Evaluation (Signed)
Anesthesia Evaluation  Patient identified by MRN, date of birth, ID band Patient awake    Reviewed: Allergy & Precautions, NPO status , Patient's Chart, lab work & pertinent test results  History of Anesthesia Complications (+) PONV and history of anesthetic complications  Airway Mallampati: II  TM Distance: >3 FB Neck ROM: Full    Dental no notable dental hx. (+) Teeth Intact   Pulmonary Current Smoker,    Pulmonary exam normal breath sounds clear to auscultation       Cardiovascular hypertension, Pt. on medications Normal cardiovascular exam Rate:Normal     Neuro/Psych PSYCHIATRIC DISORDERS Anxiety Depression negative neurological ROS     GI/Hepatic Neg liver ROS, GERD  Medicated and Controlled,Hx/o Colon polyps   Endo/Other  Obesity Hyperlipidemia  Renal/GU negative Renal ROS  negative genitourinary   Musculoskeletal  (+) Arthritis , Hx/o multiple osteochondromas   Abdominal (+) + obese,   Peds  Hematology negative hematology ROS (+)   Anesthesia Other Findings   Reproductive/Obstetrics                             Anesthesia Physical Anesthesia Plan  ASA: II  Anesthesia Plan: MAC   Post-op Pain Management:    Induction: Intravenous  PONV Risk Score and Plan: 2 and Ondansetron and Propofol infusion  Airway Management Planned: Natural Airway, Simple Face Mask and Nasal Cannula  Additional Equipment:   Intra-op Plan:   Post-operative Plan:   Informed Consent: I have reviewed the patients History and Physical, chart, labs and discussed the procedure including the risks, benefits and alternatives for the proposed anesthesia with the patient or authorized representative who has indicated his/her understanding and acceptance.   Dental advisory given  Plan Discussed with: CRNA, Anesthesiologist and Surgeon  Anesthesia Plan Comments:         Anesthesia Quick  Evaluation

## 2017-05-07 NOTE — Interval H&P Note (Signed)
History and Physical Interval Note:  05/07/2017 8:36 AM  Dana Roth  has presented today for surgery, with the diagnosis of History of colon polyps  The various methods of treatment have been discussed with the patient and family. After consideration of risks, benefits and other options for treatment, the patient has consented to  Procedure(s): COLONOSCOPY WITH PROPOFOL (N/A) HOT HEMOSTASIS (ARGON PLASMA COAGULATION/BICAP) (N/A) as a surgical intervention .  The patient's history has been reviewed, patient examined, no change in status, stable for surgery.  I have reviewed the patient's chart and labs.  Questions were answered to the patient's satisfaction.     [place] C.

## 2017-05-07 NOTE — Op Note (Signed)
[facility] Patient Name: Dana Roth Procedure Date : 05/07/2017 MRN: [id number] Attending MD: Lear Ng , MD Date of Birth: February 09, 1961 CSN: [id number] Age: 56 Admit Type: Outpatient Procedure:                Colonoscopy Indications:              High risk colon cancer surveillance: Personal                            history of colonic polyps, Last colonoscopy:                            February 2015 Providers:                Lear Ng, MD, Cleda Daub, RN, Elspeth Cho Tech., Technician Referring MD:              Medicines:                Propofol per Anesthesia, Monitored Anesthesia Care Complications:            No immediate complications. Estimated Blood Loss:     Estimated blood loss: none. Procedure:                Pre-Anesthesia Assessment:                           - Prior to the procedure, a History and Physical                            was performed, and patient medications and                            allergies were reviewed. The patient's tolerance of                            previous anesthesia was also reviewed. The risks                            and benefits of the procedure and the sedation                            options and risks were discussed with the patient.                            All questions were answered, and informed consent                            was obtained. Prior Anticoagulants: The patient has                            taken no previous anticoagulant or antiplatelet  agents. ASA Grade Assessment: II - A patient with                            mild systemic disease. After reviewing the risks                            and benefits, the patient was deemed in                            satisfactory condition to undergo the procedure.                           After obtaining informed consent, the colonoscope                            was  passed under direct vision. Throughout the                            procedure, the patient's blood pressure, pulse, and                            oxygen saturations were monitored continuously. The                            EC-[id number] ([id number]) scope was introduced through                            the anus and advanced to the the cecum, identified                            by appendiceal orifice and ileocecal valve. The                            colonoscopy was performed with difficulty due to                            significant looping, a tortuous colon and fair                            prep. Successful completion of the procedure was                            aided by straightening and shortening the scope to                            obtain bowel loop reduction, applying abdominal                            pressure and lavage. The patient tolerated the                            procedure well. The quality of the bowel  preparation was fair and fair but repeated                            irrigation led to a good and adequate prep. The                            terminal ileum, ileocecal valve, appendiceal                            orifice, and rectum were photographed. Scope In: 8:49:54 AM Scope Out: 9:16:24 AM Scope Withdrawal Time: 0 hours 22 minutes 9 seconds  Total Procedure Duration: 0 hours 26 minutes 30 seconds  Findings:      The perianal and digital rectal examinations were normal.      A 8 mm polyp was found in the cecum. The polyp was flat. Polypectomy was       attempted with a hot snare. No tissue was resected. Polyp resection was       unsuccessful due to flattening of the polyp preventing snaring.       Fulguration to ablate the lesion by argon plasma was successful.       Estimated blood loss: none.      Multiple small and large-mouthed diverticula were found in the sigmoid       colon.      Internal hemorrhoids were found  during retroflexion. The hemorrhoids       were large and Grade I (internal hemorrhoids that do not prolapse).      A tattoo was seen in the ascending colon. A post-polypectomy scar was       found at the tattoo site. There was no evidence of residual polyp tissue.      The terminal ileum appeared normal. Impression:               - Preparation of the colon was fair.                           - One 8 mm polyp in the cecum. Unsuccessful polyp                            resection. Treated with argon plasma coagulation                            (APC).                           - Diverticulosis in the sigmoid colon.                           - Internal hemorrhoids.                           - A tattoo was seen in the ascending colon. A                            post-polypectomy scar was found at the tattoo site.  There was no evidence of residual polyp tissue.                           - The examined portion of the ileum was normal.                           - No specimens collected. Moderate Sedation:      N/A - MAC procedure Recommendation:           - Repeat colonoscopy in 5 years for surveillance.                           - High fiber diet.                           - Continue present medications.                           - Patient has a contact number available for                            emergencies. The signs and symptoms of potential                            delayed complications were discussed with the                            patient. Return to normal activities tomorrow.                            Written discharge instructions were provided to the                            patient. Procedure Code(s):        --- Professional ---                           [phone], Colonoscopy, flexible; with ablation of                            tumor(s), polyp(s), or other lesion(s) (includes                            pre- and post-dilation and guide wire  passage, when                            performed) Diagnosis Code(s):        --- Professional ---                           Z86.010, Personal history of colonic polyps                           D12.0, Benign neoplasm of cecum  K64.0, First degree hemorrhoids                           K57.30, Diverticulosis of large intestine without                            perforation or abscess without bleeding CPT copyright 2016 American Medical Association. All rights reserved. The codes documented in this report are preliminary and upon coder review may  be revised to meet current compliance requirements. Lear Ng, MD 05/07/2017 9:32:55 AM This report has been signed electronically. Number of Addenda: 0

## 2017-05-07 NOTE — Discharge Instructions (Signed)

## 2017-05-07 NOTE — Transfer of Care (Signed)
Immediate Anesthesia Transfer of Care Note  Patient: Dana Roth  Procedure(s) Performed: COLONOSCOPY WITH PROPOFOL (N/A ) HOT HEMOSTASIS (ARGON PLASMA COAGULATION/BICAP) (N/A )  Patient Location: Endoscopy Unit  Anesthesia Type:MAC  Level of Consciousness: awake and patient cooperative  Airway & Oxygen Therapy: Patient Spontanous Breathing and Patient connected to face mask oxygen  Post-op Assessment: Report given to RN and Post -op Vital signs reviewed and stable  Post vital signs: Reviewed and stable  Last Vitals:  Vitals:   05/07/17 0744  BP: 124/89  Resp: 18  Temp: 36.9 C  SpO2: 98%    Last Pain:  Vitals:   05/07/17 0744  TempSrc: Oral         Complications: No apparent anesthesia complications

## 2017-05-07 NOTE — Anesthesia Postprocedure Evaluation (Signed)
Anesthesia Post Note  Patient: Dana Roth  Procedure(s) Performed: COLONOSCOPY WITH PROPOFOL (N/A ) HOT HEMOSTASIS (ARGON PLASMA COAGULATION/BICAP) (N/A )     Patient location during evaluation: Endoscopy Anesthesia Type: MAC Level of consciousness: awake and alert and oriented Pain management: pain level controlled Vital Signs Assessment: post-procedure vital signs reviewed and stable Respiratory status: spontaneous breathing, nonlabored ventilation and respiratory function stable Cardiovascular status: stable and blood pressure returned to baseline Postop Assessment: no apparent nausea or vomiting Anesthetic complications: no    Last Vitals:  Vitals:   05/07/17 0744 05/07/17 0925  BP: 124/89 119/68  Pulse:  (!) 105  Resp: 18 18  Temp: 36.9 C 36.8 C  SpO2: 98% 99%    Last Pain:  Vitals:   05/07/17 0925  TempSrc: Oral                 Paulena Servais A.

## 2017-05-07 NOTE — H&P (Signed)
Date of Initial H&P: 04/29/17  History reviewed, patient examined, no change in status, stable for surgery.

## 2017-05-08 ENCOUNTER — Encounter (HOSPITAL_COMMUNITY): Payer: Self-pay | Admitting: Gastroenterology

## 2019-06-20 DIAGNOSIS — M5126 Other intervertebral disc displacement, lumbar region: Secondary | ICD-10-CM | POA: Insufficient documentation

## 2019-07-04 DIAGNOSIS — Z6826 Body mass index (BMI) 26.0-26.9, adult: Secondary | ICD-10-CM | POA: Insufficient documentation

## 2019-07-25 ENCOUNTER — Ambulatory Visit: Payer: Medicaid Other | Admitting: Diagnostic Neuroimaging

## 2019-07-27 DIAGNOSIS — I10 Essential (primary) hypertension: Secondary | ICD-10-CM | POA: Insufficient documentation

## 2019-08-10 ENCOUNTER — Other Ambulatory Visit: Payer: Self-pay

## 2020-03-30 ENCOUNTER — Other Ambulatory Visit: Payer: Self-pay

## 2020-04-02 ENCOUNTER — Other Ambulatory Visit: Payer: Self-pay

## 2020-04-02 DIAGNOSIS — I83893 Varicose veins of bilateral lower extremities with other complications: Secondary | ICD-10-CM

## 2020-04-12 ENCOUNTER — Ambulatory Visit (HOSPITAL_COMMUNITY)
Admission: RE | Admit: 2020-04-12 | Discharge: 2020-04-12 | Disposition: A | Discharge status: Home / Self Care | Payer: Medicaid Other | Source: Ambulatory Visit | Attending: Vascular Surgery | Admitting: Vascular Surgery

## 2020-04-12 ENCOUNTER — Ambulatory Visit: Payer: Medicaid Other | Admitting: Physician Assistant

## 2020-04-12 ENCOUNTER — Other Ambulatory Visit: Payer: Self-pay

## 2020-04-12 VITALS — BP 131/84 | HR 86 | Temp 98.2°F | Resp 20 | Ht 64.0 in | Wt 162.1 lb

## 2020-04-12 DIAGNOSIS — M79604 Pain in right leg: Secondary | ICD-10-CM | POA: Diagnosis not present

## 2020-04-12 DIAGNOSIS — M79605 Pain in left leg: Secondary | ICD-10-CM | POA: Diagnosis not present

## 2020-04-12 DIAGNOSIS — I83893 Varicose veins of bilateral lower extremities with other complications: Secondary | ICD-10-CM | POA: Diagnosis not present

## 2020-04-12 NOTE — Progress Notes (Signed)
[facility] [place]   Reason for referral: B leg pain with visible veins on anterior shins.  History of Present Illness  Dana Roth is a 59 y.o. female who presents with chief complaint: Left > right lower leg pain.  She also mentioned tingling and decreased sensation in both her hands.  She has no trauma recently, no apparent edema.  She denise rest pain, Claudication and non healing wounds.  Past medical history includes: non metastatic bone tumors, HTN, lumbar stenosis, Dyslipidemia.  She denise DM and CAD.  She does smoke both tobacco and cannabis daily.  Past Medical History:  Diagnosis Date  . Arthritis   . Complication of anesthesia    problem with B/P dropping  . Depression   . Dyslipidemia   . GERD (gastroesophageal reflux disease)   . HTN (hypertension)    "only with MD visits, caused by anxiety",med taken caused chest pain-so stopped.  . Multiple osteochondroma    Multiple bone surgeries  . Needle phobia   . PONV (postoperative nausea and vomiting)   . Rectal bleeding   . Suspicious mole     Past Surgical History:  Procedure Laterality Date  . bone tumors     Multiple excisions-all over body "occ. bones lock up"  . CHOLECYSTECTOMY    . COLONOSCOPY WITH PROPOFOL N/A 06/02/2013   Procedure: COLONOSCOPY WITH PROPOFOL;  Surgeon: Lear Ng, MD;  Location: [facility];  Service: Endoscopy;  Laterality: N/A;  . COLONOSCOPY WITH PROPOFOL N/A 05/07/2017   Procedure: COLONOSCOPY WITH PROPOFOL;  Surgeon: Wilford Corner, MD;  Location: [place];  Service: Endoscopy;  Laterality: N/A;  . HOT HEMOSTASIS N/A 05/07/2017   Procedure: HOT HEMOSTASIS (ARGON PLASMA COAGULATION/BICAP);  Surgeon: Wilford Corner, MD;  Location: [place];  Service: Endoscopy;  Laterality: N/A;    Social History   Socioeconomic History  . Marital status: Divorced    Spouse name: Not on file  . Number of children: Not on file  . Years of education: Not  on file  . Highest education level: Not on file  Occupational History    Comment: [employer]  Tobacco Use  . Smoking status: Current Every Day Smoker    Packs/day: 1.00    Years: 40.00    Pack years: 40.00    Types: Cigarettes  . Smokeless tobacco: Never Used  Vaping Use  . Vaping Use: Former  Substance and Sexual Activity  . Alcohol use: No  . Drug use: Yes    Frequency: 7.0 times per week    Types: Marijuana    Comment: pt smoke marijuana everyday, last used 04/1717  . Sexual activity: Not on file  Other Topics Concern  . Not on file  Social History Narrative  . Not on file   Social Determinants of Health   Financial Resource Strain:   . Difficulty of Paying Living Expenses: Not on file  Food Insecurity:   . Worried About Charity fundraiser in the Last Year: Not on file  . Ran Out of Food in the Last Year: Not on file  Transportation Needs:   . Lack of Transportation (Medical): Not on file  . Lack of Transportation (Non-Medical): Not on file  Physical Activity:   . Days of Exercise per Week: Not on file  . Minutes of Exercise per Session: Not on file  Stress:   . Feeling of Stress : Not on file  Social Connections:   . Frequency of Communication with Friends  and Family: Not on file  . Frequency of Social Gatherings with Friends and Family: Not on file  . Attends Religious Services: Not on file  . Active Member of Clubs or Organizations: Not on file  . Attends Archivist Meetings: Not on file  . Marital Status: Not on file  Intimate Partner Violence:   . Fear of Current or Ex-Partner: Not on file  . Emotionally Abused: Not on file  . Physically Abused: Not on file  . Sexually Abused: Not on file    Family History  Problem Relation Age of Onset  . Heart disease Brother        "Hole in heart"  . CAD Sister     Current Outpatient Medications on File Prior to Visit  Medication Sig Dispense Refill  . busPIRone (BUSPAR) 10 MG tablet  Take 10 mg by mouth daily. Take 1 daily and 1 as needed for anxiety    . clonazePAM (KLONOPIN) 0.5 MG tablet Take 0.5 mg by mouth 3 (three) times daily as needed.    . pravastatin (PRAVACHOL) 40 MG tablet Take 1 tablet (40 mg total) by mouth daily. 90 tablet 3  . venlafaxine (EFFEXOR) 75 MG tablet Take 75 mg by mouth daily. Takes 3 capsules once a day.     No current facility-administered medications on file prior to visit.    Allergies as of 04/12/2020 - Review Complete 04/12/2020  Allergen Reaction Noted  . Codeine  01/03/2013     ROS:   General:  No weight loss, Fever, chills  HEENT: No recent headaches, no nasal bleeding, no visual changes, no sore throat  Neurologic: No dizziness, blackouts, seizures. No recent symptoms of stroke or mini- stroke. No recent episodes of slurred speech, or temporary blindness.  Cardiac: No recent episodes of chest pain/pressure, no shortness of breath at rest.  No shortness of breath with exertion.  Denies history of atrial fibrillation or irregular heartbeat  Vascular: No history of rest pain in feet.  No history of claudication.  No history of non-healing ulcer, No history of DVT   Pulmonary: No home oxygen, no productive cough, no hemoptysis,  No asthma or wheezing  Musculoskeletal:  [ ]  Arthritis, [ ]  Low back pain,  [ ]  Joint pain  Hematologic:No history of hypercoagulable state.  No history of easy bleeding.  No history of anemia  Gastrointestinal: No hematochezia or melena,  No gastroesophageal reflux, no trouble swallowing  Urinary: [ ]  chronic Kidney disease, [ ]  on HD - [ ]  MWF or [ ]  TTHS, [ ]  Burning with urination, [ ]  Frequent urination, [ ]  Difficulty urinating;   Skin: No rashes  Psychological: No history of anxiety,  Positvie history of depression  Physical Examination  Vitals:   04/12/20 1501  BP: 131/84  Pulse: 86  Resp: 20  Temp: 98.2 F (36.8 C)  TempSrc: Temporal  SpO2: 99%  Weight: 162 lb 1.6 oz (73.5 kg)   Height: 5\' 4"  (1.626 m)    Body mass index is 27.82 kg/m.  General:  Alert and oriented, no acute distress HEENT: Normal Neck: No bruit or JVD Pulmonary: Clear to auscultation bilaterally Cardiac: Regular Rate and Rhythm without murmur Abdomen: Soft, non-tender, non-distended, no mass, no scars Skin: No rash, healed left LE scars from previous injury Extremity Pulses:  2+ radial, brachial, femoral, dorsalis pedis, posterior tibial pulses bilaterally Musculoskeletal: No deformity or edema  Neurologic: Upper and lower extremity motor 5/5 and symmetric  DATA: Venous Reflux  Times  +--------------+---------+------+-----------+------------+--------+  RIGHT     Reflux NoRefluxReflux TimeDiameter cmsComments               Yes                   +--------------+---------+------+-----------+------------+--------+  CFV      no                         +--------------+---------+------+-----------+------------+--------+  FV prox    no                         +--------------+---------+------+-----------+------------+--------+  FV mid    no                         +--------------+---------+------+-----------+------------+--------+  FV dist    no                         +--------------+---------+------+-----------+------------+--------+  Popliteal   no                         +--------------+---------+------+-----------+------------+--------+  GSV at SFJ  no               0.557        +--------------+---------+------+-----------+------------+--------+  GSV prox thighno               0.330        +--------------+---------+------+-----------+------------+--------+  GSV mid thigh no               0.397         +--------------+---------+------+-----------+------------+--------+  GSV dist thighno               0.237        +--------------+---------+------+-----------+------------+--------+  GSV at knee  no               0.278        +--------------+---------+------+-----------+------------+--------+  GSV prox calf                 0.270        +--------------+---------+------+-----------+------------+--------+  GSV mid calf                 0.246        +--------------+---------+------+-----------+------------+--------+  SSV Pop Fossa                 0.233        +--------------+---------+------+-----------+------------+--------+  SSV prox calf no               0.274        +--------------+---------+------+-----------+------------+--------+  SSV mid calf no               0.247        +--------------+---------+------+-----------+------------+--------+     +--------------+---------+------+-----------+------------+--------+  LEFT     Reflux NoRefluxReflux TimeDiameter cmsComments               Yes                   +--------------+---------+------+-----------+------------+--------+  CFV            yes  >1 second             +--------------+---------+------+-----------+------------+--------+  FV prox    no                         +--------------+---------+------+-----------+------------+--------+  FV mid    no                         +--------------+---------+------+-----------+------------+--------+  FV dist    no                         +--------------+---------+------+-----------+------------+--------+  Popliteal   no                          +--------------+---------+------+-----------+------------+--------+  GSV at SFJ        yes  >500 ms   0.116        +--------------+---------+------+-----------+------------+--------+  GSV prox thighno               0.511        +--------------+---------+------+-----------+------------+--------+  GSV mid thigh no               0.535        +--------------+---------+------+-----------+------------+--------+  GSV dist thighno               0.493        +--------------+---------+------+-----------+------------+--------+  GSV at knee                  0.432        +--------------+---------+------+-----------+------------+--------+  GSV prox calf                 0.428        +--------------+---------+------+-----------+------------+--------+  GSV mid calf                 0.429        +--------------+---------+------+-----------+------------+--------+  SSV Pop Fossa                 0.373        +--------------+---------+------+-----------+------------+--------+  SSV prox calf no               0.392        +--------------+---------+------+-----------+------------+--------+  SSV mid calf no               0.350        +--------------+---------+------+-----------+------------+--------+         Summary:  Right:  - No evidence of deep vein thrombosis seen in the right lower extremity,  from the common femoral through the popliteal veins.  - No evidence of superficial venous thrombosis in the right lower  extremity.  - No evidence of superficial venous reflux seen in the right greater  saphenous vein.  - No evidence of superficial venous reflux seen in the right short  saphenous vein.    Left:  - No evidence of deep vein  thrombosis seen in the left lower extremity,  from the common femoral through the popliteal veins.  - No evidence of superficial venous thrombosis in the left lower  extremity.  - No evidence of superficial venous reflux seen in the left short  saphenous vein.  - Venous reflux is noted in the left common femoral vein.  - Venous reflux is noted in the left sapheno-femoral junction.     Assessment: Negative for DVT Left common femoral and SFJ reflux on the left LE without reflux in the GSV No arterial PAD noted with palpable pedal pulses.   Plan: She is negative for reflux in  GSV B LE, palpable pedal pulses.  She is not at risk of limb loss.  As far as her pain it is  not accountable due to her vessels.  Her hand numbness could be peripheral neuropathy verse carpal tunnel.  She may benefit from nerve conduction studies.    F/U PRN  Roxy Horseman PA-C [facility] [place] Office: [phone]  MD in clinic [place]

## 2020-09-26 ENCOUNTER — Other Ambulatory Visit: Payer: Self-pay | Admitting: Orthopaedic Surgery

## 2020-09-26 DIAGNOSIS — M25572 Pain in left ankle and joints of left foot: Secondary | ICD-10-CM

## 2020-09-26 DIAGNOSIS — D480 Neoplasm of uncertain behavior of bone and articular cartilage: Secondary | ICD-10-CM

## 2020-10-23 ENCOUNTER — Other Ambulatory Visit: Payer: Self-pay | Admitting: Orthopaedic Surgery

## 2020-10-23 DIAGNOSIS — D480 Neoplasm of uncertain behavior of bone and articular cartilage: Secondary | ICD-10-CM

## 2020-10-23 DIAGNOSIS — M25572 Pain in left ankle and joints of left foot: Secondary | ICD-10-CM

## 2021-01-30 ENCOUNTER — Other Ambulatory Visit: Payer: Self-pay | Admitting: Physician Assistant

## 2021-01-30 DIAGNOSIS — R102 Pelvic and perineal pain: Secondary | ICD-10-CM

## 2022-12-19 ENCOUNTER — Other Ambulatory Visit: Payer: Self-pay | Admitting: *Deleted

## 2022-12-19 DIAGNOSIS — I83813 Varicose veins of bilateral lower extremities with pain: Secondary | ICD-10-CM

## 2022-12-26 ENCOUNTER — Ambulatory Visit (HOSPITAL_COMMUNITY): Payer: Medicaid Other

## 2022-12-26 ENCOUNTER — Ambulatory Visit: Payer: Medicaid Other

## 2022-12-29 ENCOUNTER — Ambulatory Visit: Payer: Medicaid Other | Admitting: Podiatry

## 2022-12-29 ENCOUNTER — Ambulatory Visit: Payer: Medicaid Other

## 2022-12-29 DIAGNOSIS — M7752 Other enthesopathy of left foot: Secondary | ICD-10-CM | POA: Diagnosis not present

## 2022-12-29 DIAGNOSIS — I872 Venous insufficiency (chronic) (peripheral): Secondary | ICD-10-CM

## 2022-12-29 DIAGNOSIS — M778 Other enthesopathies, not elsewhere classified: Secondary | ICD-10-CM

## 2022-12-29 DIAGNOSIS — R600 Localized edema: Secondary | ICD-10-CM

## 2022-12-29 NOTE — Progress Notes (Signed)
  Subjective:  Patient ID: Dana Roth, female    DOB: 1961-01-13,  MRN: [id number]  Chief Complaint  Patient presents with   Foot Swelling    Patient complaining of swelling and skin discoloration to left foot. Patient stated it is only painful when she has the swelling and tightness. Patient has tried using compression socks with no relief.     62 y.o. female presents with concern for swelling and some purplish discoloration in the left foot.  She also says that she has an ankle issue and was told by another surgeon that she might need surgery to repair and put some bones back together in her ankle however she was told that she cannot have that done until she stops smoking.  She does smoke half pack per day currently.  Her primary concern however is the swelling in the left ankle and foot.  She notes that she has her foot down too long she will get some purplish discoloration and will get very swollen.  She does have pain when this happens.  Past Medical History:  Diagnosis Date   Arthritis    Complication of anesthesia    problem with B/P dropping   Depression    Dyslipidemia    GERD (gastroesophageal reflux disease)    HTN (hypertension)    "only with MD visits, caused by anxiety",med taken caused chest pain-so stopped.   Multiple osteochondroma    Multiple bone surgeries   Needle phobia    PONV (postoperative nausea and vomiting)    Rectal bleeding    Suspicious mole     Allergies  Allergen Reactions   Codeine     ROS: Negative except as per HPI above  Objective:  General: AAO x3, NAD  Dermatological: Varicosities present as well as some purplish discoloration on the dorsal foot.  Vascular:  Dorsalis Pedis artery and Posterior Tibial artery pedal pulses are 2/4 bilateral.  Capillary fill time < 3 sec to all digits.   Neruologic: Grossly intact via light touch bilateral. Protective threshold intact to all sites bilateral.   Musculoskeletal: Edema noted of the foot  and ankle on the left side.  Gait: Unassisted, Nonantalgic.   No images are attached to the encounter.  Radiographs:  Deferred Assessment:   1. Venous (peripheral) insufficiency   2. Edema of left foot   3. Capsulitis of foot, left      Plan:  Patient was evaluated and treated and all questions answered.  # Peripheral venous insufficiency left lower extremity -Discussed patient does have evidence of venous disease and venous insufficiency on the left side. -I recommend compression stockings which she is currently using but says they hurt because they are too compressive -Recommend referral to vascular patient says she has an appointment with the vein clinic at [facility] tomorrow recommend she keep this for further evaluation for venous insufficiency -Could consider compression pumps if compression stockings or not an option for chronic swelling  # Left ankle pain -Recommend smoking cessation prior to any surgical consideration -Patient will follow-up as needed pending the results of the vein clinic and see if they can provide any improvement  Return if symptoms worsen or fail to improve.          Corinna Gab, DPM [facility] / [facility]

## 2022-12-30 ENCOUNTER — Ambulatory Visit (HOSPITAL_COMMUNITY)
Admission: RE | Admit: 2022-12-30 | Discharge: 2022-12-30 | Disposition: A | Discharge status: Home / Self Care | Payer: Medicaid Other | Source: Ambulatory Visit | Attending: Physician Assistant | Admitting: Physician Assistant

## 2022-12-30 ENCOUNTER — Ambulatory Visit: Payer: Medicaid Other | Admitting: Physician Assistant

## 2022-12-30 VITALS — BP 126/71 | HR 100 | Temp 98.5°F | Resp 18 | Ht 64.0 in | Wt 176.6 lb

## 2022-12-30 DIAGNOSIS — I8393 Asymptomatic varicose veins of bilateral lower extremities: Secondary | ICD-10-CM | POA: Diagnosis not present

## 2022-12-30 DIAGNOSIS — I872 Venous insufficiency (chronic) (peripheral): Secondary | ICD-10-CM | POA: Diagnosis not present

## 2022-12-30 DIAGNOSIS — I83813 Varicose veins of bilateral lower extremities with pain: Secondary | ICD-10-CM | POA: Insufficient documentation

## 2022-12-30 NOTE — Progress Notes (Unsigned)
Requested by:  Norm Salt, PA [address] [place],  [place] [address]  Reason for consultation: left leg swelling     History of Present Illness   Dana Roth is a 62 y.o. (1961-02-17) female who presents for evaluation of left leg swelling.  She has had left lower leg and ankle swelling for about 7 to 8 months.  This has stayed about the same over time.  Her swelling usually worsens if she spends a long time on her feet or a long time sitting. She cannot lay flat on her back due to difficulty breathing, and she has a hard time elevating her legs when sitting due to chronic back pain.  She also reports occasional stabbing pains in her legs at her spider vein sites. She cannot tolerate compression stockings after a few hours because they make her legs hurt.  She denies any history of DVT, bleeding events, or ulcerations.  Past Medical History:  Diagnosis Date   Arthritis    Complication of anesthesia    problem with B/P dropping   Depression    Dyslipidemia    GERD (gastroesophageal reflux disease)    HTN (hypertension)    "only with MD visits, caused by anxiety",med taken caused chest pain-so stopped.   Multiple osteochondroma    Multiple bone surgeries   Needle phobia    PONV (postoperative nausea and vomiting)    Rectal bleeding    Suspicious mole     Past Surgical History:  Procedure Laterality Date   bone tumors     Multiple excisions-all over body "occ. bones lock up"   CHOLECYSTECTOMY     COLONOSCOPY WITH PROPOFOL N/A 06/02/2013   Procedure: COLONOSCOPY WITH PROPOFOL;  Surgeon: Shirley Friar, MD;  Location: [facility] ENDOSCOPY;  Service: Endoscopy;  Laterality: N/A;   COLONOSCOPY WITH PROPOFOL N/A 05/07/2017   Procedure: COLONOSCOPY WITH PROPOFOL;  Surgeon: Charlott Rakes, MD;  Location: [facility] ENDOSCOPY;  Service: Endoscopy;  Laterality: N/A;   HOT HEMOSTASIS N/A 05/07/2017   Procedure: HOT HEMOSTASIS (ARGON PLASMA COAGULATION/BICAP);  Surgeon:  Charlott Rakes, MD;  Location: [facility] ENDOSCOPY;  Service: Endoscopy;  Laterality: N/A;    Social History   Socioeconomic History   Marital status: Divorced    Spouse name: Not on file   Number of children: Not on file   Years of education: Not on file   Highest education level: Not on file  Occupational History    Comment: [employer]  Tobacco Use   Smoking status: Every Day    Packs/day: 1.00    Years: 40.00    Additional pack years: 0.00    Total pack years: 40.00    Types: Cigarettes   Smokeless tobacco: Never  Vaping Use   Vaping Use: Former  Substance and Sexual Activity   Alcohol use: No   Drug use: Yes    Frequency: 7.0 times per week    Types: Marijuana    Comment: pt smoke marijuana everyday, last used 04/1717   Sexual activity: Not on file  Other Topics Concern   Not on file  Social History Narrative   Not on file   Social Determinants of Health   Financial Resource Strain: Not on file  Food Insecurity: Not on file  Transportation Needs: Not on file  Physical Activity: Not on file  Stress: Not on file  Social Connections: Not on file  Intimate Partner Violence: Not on file    Family History  Problem Relation Age  of Onset   Heart disease Brother        "Hole in heart"   CAD Sister     Current Outpatient Medications  Medication Sig Dispense Refill   clonazePAM (KLONOPIN) 0.5 MG tablet Take 0.5 mg by mouth 3 (three) times daily as needed.     QUEtiapine (SEROQUEL) 50 MG tablet Take 50 mg by mouth at bedtime.     busPIRone (BUSPAR) 10 MG tablet Take 10 mg by mouth daily. Take 1 daily and 1 as needed for anxiety (Patient not taking: Reported on 12/30/2022)     pravastatin (PRAVACHOL) 40 MG tablet Take 1 tablet (40 mg total) by mouth daily. (Patient not taking: Reported on 12/30/2022) 90 tablet 3   venlafaxine (EFFEXOR) 75 MG tablet Take 75 mg by mouth daily. Takes 3 capsules once a day. (Patient not taking: Reported on 12/30/2022)     No  current facility-administered medications for this visit.    Allergies  Allergen Reactions   Codeine     REVIEW OF SYSTEMS (negative unless checked):   Cardiac:  []  Chest pain or chest pressure? []  Shortness of breath upon activity? []  Shortness of breath when lying flat? []  Irregular heart rhythm?  Vascular:  []  Pain in calf, thigh, or hip brought on by walking? []  Pain in feet at night that wakes you up from your sleep? []  Blood clot in your veins? [x]  Leg swelling?  Pulmonary:  []  Oxygen at home? []  Productive cough? []  Wheezing?  Neurologic:  []  Sudden weakness in arms or legs? []  Sudden numbness in arms or legs? []  Sudden onset of difficult speaking or slurred speech? []  Temporary loss of vision in one eye? []  Problems with dizziness?  Gastrointestinal:  []  Blood in stool? []  Vomited blood?  Genitourinary:  []  Burning when urinating? []  Blood in urine?  Psychiatric:  []  Major depression  Hematologic:  []  Bleeding problems? []  Problems with blood clotting?  Dermatologic:  []  Rashes or ulcers?  Constitutional:  []  Fever or chills?  Ear/Nose/Throat:  []  Change in hearing? []  Nose bleeds? []  Sore throat?  Musculoskeletal:  []  Back pain? []  Joint pain? []  Muscle pain?   Physical Examination     Vitals:   12/30/22 1522  BP: 126/71  Pulse: 100  Resp: 18  Temp: 98.5 F (36.9 C)  TempSrc: Temporal  SpO2: 95%  Weight: 176 lb 9.6 oz (80.1 kg)  Height: 5\' 4"  (1.626 m)   Body mass index is 30.31 kg/m.  General:  WDWN in NAD; vital signs documented above Gait: Not observed HENT: WNL, normocephalic Pulmonary: normal non-labored breathing  Cardiac: regular Abdomen: soft, NT, no masses Skin: without rashes Vascular Exam/Pulses: 2+ DP pulses bilaterally Extremities: trace edema of L calf and ankle. Scattered reticular and spider veins of BLE. Mild stasis pigmentation of LLE. No ulcerations Musculoskeletal: no muscle wasting or  atrophy  Neurologic: A&O X 3;  No focal weakness or paresthesias are detected Psychiatric:  The pt has Normal affect.  Non-invasive Vascular Imaging   LLE Venous Insufficiency Duplex (12/30/2022):  +--------------+---------+------+-----------+------------+--------+  LEFT         Reflux NoRefluxReflux TimeDiameter cmsComments                          Yes                                   +--------------+---------+------+-----------+------------+--------+  CFV          no                                              +--------------+---------+------+-----------+------------+--------+  FV mid        no                                              +--------------+---------+------+-----------+------------+--------+  Popliteal    no                                              +--------------+---------+------+-----------+------------+--------+  GSV at SFJ              yes    >500 ms      .978              +--------------+---------+------+-----------+------------+--------+  GSV prox thighno                            .534              +--------------+---------+------+-----------+------------+--------+  GSV mid thigh no                            .622              +--------------+---------+------+-----------+------------+--------+  GSV dist thighno                            .602              +--------------+---------+------+-----------+------------+--------+  GSV at knee   no                            .633              +--------------+---------+------+-----------+------------+--------+  GSV prox calf           yes    >500 ms      .340              +--------------+---------+------+-----------+------------+--------+  GSV mid calf  no                            .228              +--------------+---------+------+-----------+------------+--------+  SSV Pop Fossa no                            .419               +--------------+---------+------+-----------+------------+--------+  SSV prox calf no                            [id number]                Medical Decision Making   Soniah Inzunza is a 62 y.o. female who presents for evaluation of LLE swelling  Based on the patient's duplex, there is reflux in the left greater saphenous vein at the saphenofemoral junction and proximal calf.  The remainder of the deep and superficial venous system is competent.  There is no DVT or SVT.  Given that her greater saphenous vein is mostly competent in the thigh, she would not be a candidate for ablation She has a 7-8 month history of stable left lower extremity and ankle swelling.  She has previously not been able to tolerate compression stockings for more than a few hours due to pain.  She also has a hard time laying flat and elevating her legs due to chronic back pain and difficulty breathing She also has stabbing pains in her left lower extremity occasionally from her spider veins sites I do not think any of her pains are coming from underlying arterial disease.  She denies any symptoms of claudication or rest pain.  She also has palpable pedal pulses bilaterally I have explained to the patient that her symptoms are due to venous insufficiency, which can be treated conservatively. I have remeasured the patient for 15 to 20 mmHg knee high compression stockings, which I hope she can tolerate better than her previous stockings.  I have also encouraged her to try to elevate her legs as much as she can without aggravating her back, to help with leg swelling. I encouraged walking and maintaining a healthy diet to reduce leg swelling as well She can follow up with our office as needed   Ernestene Mention, PA-C [facility] Office: [phone]  12/30/2022, 3:27 PM  Clinic MD: Steve Rattler

## 2023-02-25 DIAGNOSIS — E279 Disorder of adrenal gland, unspecified: Secondary | ICD-10-CM | POA: Insufficient documentation

## 2023-02-25 DIAGNOSIS — F1320 Sedative, hypnotic or anxiolytic dependence, uncomplicated: Secondary | ICD-10-CM | POA: Insufficient documentation

## 2023-06-16 ENCOUNTER — Ambulatory Visit: Payer: Medicaid Other | Admitting: Podiatry

## 2023-06-16 ENCOUNTER — Encounter: Payer: Self-pay | Admitting: Podiatry

## 2023-06-16 DIAGNOSIS — M21611 Bunion of right foot: Secondary | ICD-10-CM

## 2023-06-16 DIAGNOSIS — D492 Neoplasm of unspecified behavior of bone, soft tissue, and skin: Secondary | ICD-10-CM

## 2023-06-16 NOTE — Progress Notes (Signed)
Chief Complaint  Patient presents with   Callouses    Right foot. Ball of foot. Has tried to file down herself, unable to maintain    HPI: 62 y.o. female presents today with complaint of painful lesion to the ball of the right foot and to right first toe.  She has tried to manage this herself by filing it down and has become more difficult to do so and more painful.  She also complains of right foot bunion with pain to her bump with certain shoes.  She does endorse tobacco history. Relates history of Osteochondromas in her legs.  Patient denies any nausea, vomiting, fever, chills, chest pain or shortness of breath.   Past Medical History:  Diagnosis Date   Arthritis    Complication of anesthesia    problem with B/P dropping   Depression    Dyslipidemia    GERD (gastroesophageal reflux disease)    HTN (hypertension)    "only with MD visits, caused by anxiety",med taken caused chest pain-so stopped.   Multiple osteochondroma    Multiple bone surgeries   Needle phobia    PONV (postoperative nausea and vomiting)    Rectal bleeding    Suspicious mole     Past Surgical History:  Procedure Laterality Date   bone tumors     Multiple excisions-all over body "occ. bones lock up"   CHOLECYSTECTOMY     COLONOSCOPY WITH PROPOFOL N/A 06/02/2013   Procedure: COLONOSCOPY WITH PROPOFOL;  Surgeon: Shirley Friar, MD;  Location: [facility] ENDOSCOPY;  Service: Endoscopy;  Laterality: N/A;   COLONOSCOPY WITH PROPOFOL N/A 05/07/2017   Procedure: COLONOSCOPY WITH PROPOFOL;  Surgeon: Charlott Rakes, MD;  Location: [facility] ENDOSCOPY;  Service: Endoscopy;  Laterality: N/A;   HOT HEMOSTASIS N/A 05/07/2017   Procedure: HOT HEMOSTASIS (ARGON PLASMA COAGULATION/BICAP);  Surgeon: Charlott Rakes, MD;  Location: [facility] ENDOSCOPY;  Service: Endoscopy;  Laterality: N/A;    Allergies  Allergen Reactions   Codeine     ROS negative except as stated in HPI   Physical Exam: There were no vitals filed  for this visit.  General: The patient is alert and oriented x3 in no acute distress.  Dermatology: Skin is warm, dry and supple bilateral lower extremities. Interspaces are clear of maceration and debris.  Right forefoot subsecond metatarsal head region there is hyperkeratotic lesion is painful with direct and lateral pressure. There is smaller hyperkeratotic lesion to the plantar medial first toe is painful with direct and lateral pressure.  Vascular: Palpable pedal pulses bilaterally. Capillary refill within normal limits.  Decreased pedal hair growth.  No erythema or calor.  Neurological: Light touch sensation grossly intact bilateral feet.   Musculoskeletal Exam: Right foot bunion deformity present with pain over medial eminence.  No pain with great toe range of motion.  Decreased plantar fat pad right foot.  Equinus noted.   Assessment/Plan of Care: 1. Skin neoplasm   2. Bunion of right foot      No orders of the defined types were placed in this encounter.  None  Discussed clinical findings with patient today.  # Benign hyperkeratotic skin lesions right foot] -Sharply debrided using 312 scalpel blade to level of patient tolerance.  No iatrogenic bleeding.  Salinocaine cream and Band-Aid applied -Aperture pad applied to the right forefoot -Instructed patient to scrub these areas with white vinegar and to file down with [employer] daily.  Advised patient to look for over-the-counter metatarsal pads or aperture pads.  #  Right foot bunion -Patient not interested in surgical intervention at this time - Tube foam bunion sleeve dispensed for the patient -Discussed shoe gear modification using shoes with wide toe box and mesh upper. -Would need to quit smoking before surgical intervention could be considered.    Davius Goudeau L. Marchia Bond, AACFAS [facility]     [address] [place], [place] [address]                Office [phone]  Fax [phone]

## 2023-06-23 ENCOUNTER — Observation Stay (HOSPITAL_COMMUNITY)
Admission: EM | Admit: 2023-06-23 | Discharge: 2023-06-24 | Disposition: A | Discharge status: Home / Self Care | Payer: Medicaid Other | Attending: Family Medicine | Admitting: Family Medicine

## 2023-06-23 ENCOUNTER — Other Ambulatory Visit: Payer: Self-pay

## 2023-06-23 ENCOUNTER — Emergency Department (HOSPITAL_COMMUNITY): Payer: Medicaid Other

## 2023-06-23 DIAGNOSIS — I10 Essential (primary) hypertension: Secondary | ICD-10-CM | POA: Diagnosis not present

## 2023-06-23 DIAGNOSIS — J441 Chronic obstructive pulmonary disease with (acute) exacerbation: Secondary | ICD-10-CM

## 2023-06-23 DIAGNOSIS — R0602 Shortness of breath: Secondary | ICD-10-CM | POA: Diagnosis present

## 2023-06-23 DIAGNOSIS — F17210 Nicotine dependence, cigarettes, uncomplicated: Secondary | ICD-10-CM | POA: Diagnosis not present

## 2023-06-23 DIAGNOSIS — R0902 Hypoxemia: Principal | ICD-10-CM | POA: Insufficient documentation

## 2023-06-23 DIAGNOSIS — Z79899 Other long term (current) drug therapy: Secondary | ICD-10-CM | POA: Insufficient documentation

## 2023-06-23 DIAGNOSIS — R918 Other nonspecific abnormal finding of lung field: Secondary | ICD-10-CM | POA: Insufficient documentation

## 2023-06-23 DIAGNOSIS — Z1152 Encounter for screening for COVID-19: Secondary | ICD-10-CM | POA: Insufficient documentation

## 2023-06-23 LAB — CBC WITH DIFFERENTIAL/PLATELET
Abs Immature Granulocytes: 0.02 10*3/uL (ref 0.00–0.07)
Basophils Absolute: 0 10*3/uL (ref 0.0–0.1)
Basophils Relative: 1 %
Eosinophils Absolute: 0.2 10*3/uL (ref 0.0–0.5)
Eosinophils Relative: 2 %
HCT: 45.5 % (ref 36.0–46.0)
Hemoglobin: 15.1 g/dL — ABNORMAL HIGH (ref 12.0–15.0)
Immature Granulocytes: 0 %
Lymphocytes Relative: 21 %
Lymphs Abs: 1.6 10*3/uL (ref 0.7–4.0)
MCH: 29.6 pg (ref 26.0–34.0)
MCHC: 33.2 g/dL (ref 30.0–36.0)
MCV: 89.2 fL (ref 80.0–100.0)
Monocytes Absolute: 0.4 10*3/uL (ref 0.1–1.0)
Monocytes Relative: 6 %
Neutro Abs: 5.4 10*3/uL (ref 1.7–7.7)
Neutrophils Relative %: 70 %
Platelets: 187 10*3/uL (ref 150–400)
RBC: 5.1 MIL/uL (ref 3.87–5.11)
RDW: 12.6 % (ref 11.5–15.5)
WBC: 7.7 10*3/uL (ref 4.0–10.5)
nRBC: 0 % (ref 0.0–0.2)

## 2023-06-23 LAB — COMPREHENSIVE METABOLIC PANEL
ALT: 23 U/L (ref 0–44)
AST: 18 U/L (ref 15–41)
Albumin: 4.2 g/dL (ref 3.5–5.0)
Alkaline Phosphatase: 90 U/L (ref 38–126)
Anion gap: 7 (ref 5–15)
BUN: 11 mg/dL (ref 8–23)
CO2: 27 mmol/L (ref 22–32)
Calcium: 9.3 mg/dL (ref 8.9–10.3)
Chloride: 97 mmol/L — ABNORMAL LOW (ref 98–111)
Creatinine, Ser: 0.68 mg/dL (ref 0.44–1.00)
GFR, Estimated: >60 mL/min (ref >60)
Glucose, Bld: 133 mg/dL — ABNORMAL HIGH (ref 70–99)
Potassium: 3.6 mmol/L (ref 3.5–5.1)
Sodium: 131 mmol/L — ABNORMAL LOW (ref 135–145)
Total Bilirubin: 0.5 mg/dL (ref <1.2)
Total Protein: 7.4 g/dL (ref 6.5–8.1)

## 2023-06-23 LAB — RESP PANEL BY RT-PCR (RSV, FLU A&B, COVID)  RVPGX2
Influenza A by PCR: NEGATIVE
Influenza B by PCR: NEGATIVE
Resp Syncytial Virus by PCR: NEGATIVE
SARS Coronavirus 2 by RT PCR: NEGATIVE

## 2023-06-23 MED ORDER — ALBUTEROL SULFATE (2.5 MG/3ML) 0.083% IN NEBU
5.0000 mg | INHALATION_SOLUTION | Freq: Once | RESPIRATORY_TRACT | Status: AC
  Administered 2023-06-23: 5 mg via RESPIRATORY_TRACT
  Filled 2023-06-23: qty 6

## 2023-06-23 MED ORDER — IOHEXOL 350 MG/ML SOLN
100.0000 mL | Freq: Once | INTRAVENOUS | Status: AC | PRN
  Administered 2023-06-23: 100 mL via INTRAVENOUS

## 2023-06-23 MED ORDER — ALBUTEROL SULFATE HFA 108 (90 BASE) MCG/ACT IN AERS
2.0000 | INHALATION_SPRAY | RESPIRATORY_TRACT | Status: DC | PRN
  Administered 2023-06-23 – 2023-06-24 (×2): 2 via RESPIRATORY_TRACT
  Filled 2023-06-23 (×2): qty 6.7

## 2023-06-23 NOTE — ED Triage Notes (Signed)
Pt BIBA from doctors office/ Pt has had SOB for last week, went to PCP. Chest xray showed mass in lung. PCP called EMS.  Given by EMS:  0.5 atrovent 125 mg solumedrol 2 duonebs 10 mg albuterol  Given decadron by PCP  AOx4

## 2023-06-23 NOTE — ED Provider Notes (Addendum)
[place] EMERGENCY DEPARTMENT AT [facility] Provider Note   CSN: [id number] Arrival date & time: 06/23/23  1801     History  Chief Complaint  Patient presents with   Shortness of Breath    Dana Roth is a 62 y.o. female.  Pt complains of shortness of breath today.  Pt reports she saw her primary care doctor and had a chest xray.  Pt was sent here for evaltuion of possible chest mass.  Pt reports she has had a cough and congestion.  Pt has an xray report that Radiologist advised Ct chest due to possible mass/cardia lesion.  Pt reports she is a smoker but has been smoking less.   The history is provided by the patient. No language interpreter was used.  Shortness of Breath Severity:  Moderate Timing:  Constant Progression:  Worsening Chronicity:  New Relieved by:  Nothing Worsened by:  Nothing Ineffective treatments:  None tried Associated symptoms: cough   Associated symptoms: no abdominal pain and no fever   Risk factors: tobacco use   Risk factors: no recent alcohol use and no prolonged immobilization        Home Medications Prior to Admission medications   Medication Sig Start Date End Date Taking? Authorizing Provider  busPIRone (BUSPAR) 10 MG tablet Take 10 mg by mouth daily. Take 1 daily and 1 as needed for anxiety    [provider]  cetirizine (ZYRTEC) 10 MG tablet Take 10 mg by mouth daily.    [provider]  clonazePAM (KLONOPIN) 0.5 MG tablet Take 0.5 mg by mouth 3 (three) times daily as needed. 04/11/20   [provider]  QUEtiapine (SEROQUEL) 50 MG tablet Take 50 mg by mouth at bedtime.    [provider]  tiZANidine (ZANAFLEX) 4 MG tablet Take 4 mg by mouth every 8 (eight) hours as needed.    [provider]      Allergies    Codeine    Review of Systems   Review of Systems  Constitutional:  Negative for fever.  Respiratory:  Positive for cough and shortness of breath.   Gastrointestinal:   Negative for abdominal pain.  All other systems reviewed and are negative.   Physical Exam Updated Vital Signs BP (!) 133/91   Pulse (!) 106   Temp 97.9 F (36.6 C) (Oral)   Resp (!) 22   Ht 5\' 4"  (1.626 m)   Wt 76.7 kg   SpO2 98%   BMI 29.01 kg/m  Physical Exam Vitals and nursing note reviewed.  Constitutional:      Appearance: She is well-developed.  HENT:     Head: Normocephalic.  Cardiovascular:     Rate and Rhythm: Tachycardia present.  Pulmonary:     Effort: Pulmonary effort is normal.     Breath sounds: Decreased breath sounds and rhonchi present.  Abdominal:     General: There is no distension.  Musculoskeletal:        General: Normal range of motion.     Cervical back: Normal range of motion.  Skin:    General: Skin is warm.  Neurological:     General: No focal deficit present.     Mental Status: She is alert and oriented to person, place, and time.  Psychiatric:        Mood and Affect: Mood normal.     ED Results / Procedures / Treatments   Labs (all labs ordered are listed, but only abnormal results are displayed)  Labs Reviewed  CBC WITH DIFFERENTIAL/PLATELET - Abnormal; Notable for the following components:      Result Value   Hemoglobin 15.1 (*)    All other components within normal limits  COMPREHENSIVE METABOLIC PANEL - Abnormal; Notable for the following components:   Sodium 131 (*)    Chloride 97 (*)    Glucose, Bld 133 (*)    All other components within normal limits  RESP PANEL BY RT-PCR (RSV, FLU A&B, COVID)  [id number]    EKG EKG Interpretation Date/Time:  Tuesday June 23 2023 18:12:24 EST Ventricular Rate:  99 PR Interval:  149 QRS Duration:  89 QT Interval:  345 QTC Calculation: 443 R Axis:   24  Text Interpretation: Sinus rhythm Right atrial enlargement Consider right ventricular hypertrophy No significant change since last tracing Confirmed by Elayne Snare ([id number]) on 06/23/2023 6:20:18 PM  Radiology DG Chest Port 1  View  Result Date: 06/23/2023 CLINICAL DATA:  Shortness of breath EXAM: PORTABLE CHEST 1 VIEW COMPARISON:  X-ray 12/10/2012 FINDINGS: No pneumothorax or effusion. No edema. Normal cardiopericardial silhouette. There is masslike enlargement seen along the left hilum and mediastinum. This has worrisome for potential malignancy versus vascular lesion and recommend follow up CT angiogram/postcontrast chest CT. Overlapping cardiac leads. Slight basilar atelectasis. IMPRESSION: Widened mediastinum with masslike area along the left side obscuring the aortic knob in the lung hilum. This has a differential including a mass versus a vascular lesion recommend CT angiogram of the chest/postcontrast CT of the chest to further delineate. Electronically Signed   By: Karen Kays M.D.   On: 06/23/2023 18:38    Procedures Procedures    Medications Ordered in ED Medications  albuterol (VENTOLIN HFA) 108 (90 Base) MCG/ACT inhaler 2 puff (2 puffs Inhalation Given 06/23/23 2058)  iohexol (OMNIPAQUE) 350 MG/ML injection 100 mL (100 mLs Intravenous Contrast Given 06/23/23 1958)    ED Course/ Medical Decision Making/ A&P                                 Medical Decision Making Amount and/or Complexity of Data Reviewed External Data Reviewed: notes.    Details: Chest xray note from primary care reviewed  Labs: ordered. Decision-making details documented in ED Course.    Details: CBC  normal wbc count  Sodium 131 Radiology: ordered and independent interpretation performed. Decision-making details documented in ED Course.    Details: Chest xray shows widened mediastinum  ECG/medicine tests: ordered and independent interpretation performed. Decision-making details documented in ED Course.    Details: EKG  normal sinus, no acute findings  Risk Risk Details: Patient was given 10 mg of Decadron by her primary care physician.  Patient was given 125 mg of Solu-Medrol by EMS.  Patient received 2 DuoNebs by EMS.  Patient  was given 2 puffs of albuterol here.  Was started on a albuterol neb. CT angio chest and abdomen were obtained.  CT shows large left hilar suprahilar mass measuring up to 7.4 cm concerning for carcinoma and emphysema.  See note for full report On reevaluation patient continues to be tachycardic.  Her oxygen saturations are in the low 90s.  An additional albuterol neb is started. Hospitalist consulted for admission.   1. Negative for aortic dissection or aneurysm. Moderate  atherosclerosis without significant stenotic lesion within the  abdomen or pelvis.  2. Large left hilar/suprahilar mass measuring up to 7.4 cm,  concerning for carcinoma. There is narrowing  and encasement of left  pulmonary arterial vessels by soft tissue mass. There is a 24 mm  irregular left apical density suspicious for a satellite mass.  Multiple enlarged mediastinal lymph nodes concerning for metastatic  disease. Multi disciplinary thoracic consultation is recommended.  3. Emphysema.  4. Hepatic steatosis.  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  6. 12 mm indeterminate right adrenal nodule. This may be assessed  with nonemergent adrenal CT. Multiple left adrenal nodules, largest  measuring 2.6 cm and most likely representing adenoma.    Aortic Atherosclerosis (ICD10-I70.0) and Emphysema (ICD10-J43.9).      Electronically Signed    By: Jasmine Pang M.D.          Final Clinical Impression(s) / ED Diagnoses Final diagnoses:  Hypoxia  SOB (shortness of breath)  Lung mass    Rx / DC Orders ED Discharge Orders     None         Elson Areas, [place] 06/23/23 2355

## 2023-06-23 NOTE — ED Notes (Signed)
Dana Roth: [phone]. To be contacted for update

## 2023-06-24 DIAGNOSIS — R918 Other nonspecific abnormal finding of lung field: Secondary | ICD-10-CM | POA: Diagnosis not present

## 2023-06-24 MED ORDER — NICOTINE POLACRILEX 2 MG MT GUM: 2.0000 mg | CHEWING_GUM | OROMUCOSAL | Status: DC | PRN

## 2023-06-24 MED ORDER — ALBUTEROL SULFATE HFA 108 (90 BASE) MCG/ACT IN AERS
2.0000 | INHALATION_SPRAY | Freq: Four times a day (QID) | RESPIRATORY_TRACT | 2 refills | Status: DC | PRN
Start: 2023-06-24 — End: 2023-08-26

## 2023-06-24 MED ORDER — NICOTINE 14 MG/24HR TD PT24: 14.0000 mg | MEDICATED_PATCH | Freq: Once | TRANSDERMAL | 0 refills | Status: AC

## 2023-06-24 MED ORDER — PREDNISONE 10 MG (21) PO TBPK: ORAL_TABLET | ORAL | 0 refills | Status: DC

## 2023-06-24 MED ORDER — LORAZEPAM 0.5 MG PO TABS
0.5000 mg | ORAL_TABLET | Freq: Three times a day (TID) | ORAL | 0 refills | Status: AC | PRN
Start: 2023-06-24 — End: 2023-07-24

## 2023-06-24 MED ORDER — NICOTINE 14 MG/24HR TD PT24
14.0000 mg | MEDICATED_PATCH | Freq: Once | TRANSDERMAL | Status: DC
  Administered 2023-06-24: 14 mg via TRANSDERMAL
  Filled 2023-06-24: qty 1

## 2023-06-24 MED ORDER — LORAZEPAM 1 MG PO TABS
1.0000 mg | ORAL_TABLET | Freq: Once | ORAL | Status: AC
  Administered 2023-06-24: 1 mg via ORAL
  Filled 2023-06-24: qty 1

## 2023-06-24 MED ORDER — DOXYCYCLINE HYCLATE 100 MG PO TABS: 100.0000 mg | ORAL_TABLET | Freq: Two times a day (BID) | ORAL | 0 refills | Status: DC

## 2023-06-24 NOTE — Consult Note (Signed)
NAME:  Dana Roth, MRN:  [id number], DOB:  [birth year], LOS: 0 ADMISSION DATE:  06/23/2023, CONSULTATION DATE:  06/24/23 REFERRING MD:  Kirby Crigler, CHIEF COMPLAINT:  SOB   History of Present Illness:  62 year old woman w/ hx of GERD, depression, heavy smoking and anxiety presenting with wheezing x 2 weeks.   Baseline MMRC 0 dyspnea on no inhalers.  Now MMRC 1-2 with wheezing.  Imaging reveals extensive lung cancer.  Pulmonary is consulted to expedite workup.  No hemoptysis, + 4lbs weight loss in past month.  Pertinent  Medical History  See above  Significant Hospital Events: Including procedures, antibiotic start and stop dates in addition to other pertinent events   ER  Interim History / Subjective:  Consult  Objective   Blood pressure (!) 148/79, pulse (!) 106, temperature 98.3 F (36.8 C), resp. rate 19, height 5\' 4"  (1.626 m), weight 76.7 kg, SpO2 96%.       No intake or output data in the 24 hours ending 06/24/23 1022 Filed Weights   06/23/23 1811  Weight: 76.7 kg    Examination: General: anxious woman in NAD HENT: MM dry, trachea midline Lungs: diminished on left, I think she's just wheezing from her hilar mass and sounds are being transmitted thorughout Cardiovascular: tachy, ext warm Abdomen: soft, +BS Extremities: no edema or clubbing Neuro: moves to command Psych: very anxious  Resolved Hospital Problem list   N/A  Assessment & Plan:  Advanced lung cancer- looks to be at least 3b, can attempt to treat for concurrent bronchospasm although may be structural. Heavy smoking hx- working on quitting - Prednisone/doxycycline - Outpatient oncology referral - Will arrange bronch next Tuesday with Dr. Tonia Brooms [place] Knock for home  Best Practice (right click and "Reselect all SmartList Selections" daily)  Per primary  Labs   CBC: Recent Labs  Lab 06/23/23 1842  WBC 7.7  NEUTROABS 5.4  HGB 15.1*  HCT 45.5  MCV 89.2  PLT 187    Basic Metabolic Panel: Recent  Labs  Lab 06/23/23 1842  NA 131*  K 3.6  CL 97*  CO2 27  GLUCOSE 133*  BUN 11  CREATININE 0.68  CALCIUM 9.3   GFR: Estimated Creatinine Clearance: 74 mL/min (by C-G formula based on SCr of 0.68 mg/dL). Recent Labs  Lab 06/23/23 1842  WBC 7.7    Liver Function Tests: Recent Labs  Lab 06/23/23 1842  AST 18  ALT 23  ALKPHOS 90  BILITOT 0.5  PROT 7.4  ALBUMIN 4.2   No results for input(s): "LIPASE", "AMYLASE" in the last 168 hours. No results for input(s): "AMMONIA" in the last 168 hours.  ABG No results found for: "PHART", "PCO2ART", "PO2ART", "HCO3", "TCO2", "ACIDBASEDEF", "O2SAT"   Coagulation Profile: No results for input(s): "INR", "PROTIME" in the last 168 hours.  Cardiac Enzymes: No results for input(s): "CKTOTAL", "CKMB", "CKMBINDEX", "TROPONINI" in the last 168 hours.  HbA1C: Hemoglobin A1C  Date/Time Value Ref Range Status  12/07/2014 10:08 AM 5.70  Final    CBG: No results for input(s): "GLUCAP" in the last 168 hours.  Review of Systems:    Positive Symptoms in bold:  Constitutional fevers, chills, weight loss, fatigue, anorexia, malaise  Eyes decreased vision, double vision, eye irritation  Ears, Nose, Mouth, Throat sore throat, trouble swallowing, sinus congestion  Cardiovascular chest pain, paroxysmal nocturnal dyspnea, lower ext edema, palpitations   Respiratory SOB, cough, DOE, hemoptysis, wheezing  Gastrointestinal nausea, vomiting, diarrhea  Genitourinary burning with urination, trouble urinating  Musculoskeletal joint aches, joint swelling, back pain  Integumentary  rashes, skin lesions  Neurological focal weakness, focal numbness, trouble speaking, headaches  Psychiatric depression, anxiety, confusion  Endocrine polyuria, polydipsia, cold intolerance, heat intolerance  Hematologic abnormal bruising, abnormal bleeding, unexplained nose bleeds  Allergic/Immunologic recurrent infections, hives, swollen lymph nodes     Past Medical  History:  She,  has a past medical history of Arthritis, Complication of anesthesia, Depression, Dyslipidemia, GERD (gastroesophageal reflux disease), HTN (hypertension), Multiple osteochondroma, Needle phobia, PONV (postoperative nausea and vomiting), Rectal bleeding, and Suspicious mole.   Surgical History:   Past Surgical History:  Procedure Laterality Date   bone tumors     Multiple excisions-all over body "occ. bones lock up"   CHOLECYSTECTOMY     COLONOSCOPY WITH PROPOFOL N/A 06/02/2013   Procedure: COLONOSCOPY WITH PROPOFOL;  Surgeon: Shirley Friar, MD;  Location: [facility] ENDOSCOPY;  Service: Endoscopy;  Laterality: N/A;   COLONOSCOPY WITH PROPOFOL N/A 05/07/2017   Procedure: COLONOSCOPY WITH PROPOFOL;  Surgeon: Charlott Rakes, MD;  Location: [facility] ENDOSCOPY;  Service: Endoscopy;  Laterality: N/A;   HOT HEMOSTASIS N/A 05/07/2017   Procedure: HOT HEMOSTASIS (ARGON PLASMA COAGULATION/BICAP);  Surgeon: Charlott Rakes, MD;  Location: [facility] ENDOSCOPY;  Service: Endoscopy;  Laterality: N/A;     Social History:   reports that she has been smoking cigarettes. She has a 40 pack-year smoking history. She has never used smokeless tobacco. She reports current drug use. Frequency: 7.00 times per week. Drug: Marijuana. She reports that she does not drink alcohol.   Family History:  Her family history includes CAD in her sister; Heart disease in her brother.   Allergies Allergies  Allergen Reactions   Codeine      Home Medications  Prior to Admission medications   Medication Sig Start Date End Date Taking? Authorizing Provider  cetirizine (ZYRTEC) 10 MG tablet Take 10 mg by mouth daily.   Yes [provider]  clonazePAM (KLONOPIN) 0.5 MG tablet Take 0.5 mg by mouth 3 (three) times daily as needed. 04/11/20  Yes [provider]  tiZANidine (ZANAFLEX) 4 MG tablet Take 4 mg by mouth every 8 (eight) hours as needed.   Yes [provider]  busPIRone (BUSPAR) 10 MG  tablet Take 10 mg by mouth daily. Take 1 daily and 1 as needed for anxiety Patient not taking: Reported on 06/24/2023    [provider]  QUEtiapine (SEROQUEL) 50 MG tablet Take 50 mg by mouth at bedtime. Patient not taking: Reported on 06/24/2023    [provider]  venlafaxine XR (EFFEXOR XR) 75 MG 24 hr capsule Take 75 mg by mouth daily with breakfast. Patient not taking: Reported on 06/24/2023    [provider]     Critical care time: N/A

## 2023-06-24 NOTE — H&P (View-Only) (Signed)
 NAME:  Dana Roth, MRN:  [id number], DOB:  [birth year], LOS: 0 ADMISSION DATE:  06/23/2023, CONSULTATION DATE:  06/24/23 REFERRING MD:  Kirby Crigler, CHIEF COMPLAINT:  SOB   History of Present Illness:  62 year old woman w/ hx of GERD, depression, heavy smoking and anxiety presenting with wheezing x 2 weeks.   Baseline MMRC 0 dyspnea on no inhalers.  Now MMRC 1-2 with wheezing.  Imaging reveals extensive lung cancer.  Pulmonary is consulted to expedite workup.  No hemoptysis, + 4lbs weight loss in past month.  Pertinent  Medical History  See above  Significant Hospital Events: Including procedures, antibiotic start and stop dates in addition to other pertinent events   ER  Interim History / Subjective:  Consult  Objective   Blood pressure (!) 148/79, pulse (!) 106, temperature 98.3 F (36.8 C), resp. rate 19, height 5\' 4"  (1.626 m), weight 76.7 kg, SpO2 96%.       No intake or output data in the 24 hours ending 06/24/23 1022 Filed Weights   06/23/23 1811  Weight: 76.7 kg    Examination: General: anxious woman in NAD HENT: MM dry, trachea midline Lungs: diminished on left, I think she's just wheezing from her hilar mass and sounds are being transmitted thorughout Cardiovascular: tachy, ext warm Abdomen: soft, +BS Extremities: no edema or clubbing Neuro: moves to command Psych: very anxious  Resolved Hospital Problem list   N/A  Assessment & Plan:  Advanced lung cancer- looks to be at least 3b, can attempt to treat for concurrent bronchospasm although may be structural. Heavy smoking hx- working on quitting - Prednisone/doxycycline - Outpatient oncology referral - Will arrange bronch next Tuesday with Dr. Tonia Brooms [place] Knock for home  Best Practice (right click and "Reselect all SmartList Selections" daily)  Per primary  Labs   CBC: Recent Labs  Lab 06/23/23 1842  WBC 7.7  NEUTROABS 5.4  HGB 15.1*  HCT 45.5  MCV 89.2  PLT 187    Basic Metabolic Panel: Recent  Labs  Lab 06/23/23 1842  NA 131*  K 3.6  CL 97*  CO2 27  GLUCOSE 133*  BUN 11  CREATININE 0.68  CALCIUM 9.3   GFR: Estimated Creatinine Clearance: 74 mL/min (by C-G formula based on SCr of 0.68 mg/dL). Recent Labs  Lab 06/23/23 1842  WBC 7.7    Liver Function Tests: Recent Labs  Lab 06/23/23 1842  AST 18  ALT 23  ALKPHOS 90  BILITOT 0.5  PROT 7.4  ALBUMIN 4.2   No results for input(s): "LIPASE", "AMYLASE" in the last 168 hours. No results for input(s): "AMMONIA" in the last 168 hours.  ABG No results found for: "PHART", "PCO2ART", "PO2ART", "HCO3", "TCO2", "ACIDBASEDEF", "O2SAT"   Coagulation Profile: No results for input(s): "INR", "PROTIME" in the last 168 hours.  Cardiac Enzymes: No results for input(s): "CKTOTAL", "CKMB", "CKMBINDEX", "TROPONINI" in the last 168 hours.  HbA1C: Hemoglobin A1C  Date/Time Value Ref Range Status  12/07/2014 10:08 AM 5.70  Final    CBG: No results for input(s): "GLUCAP" in the last 168 hours.  Review of Systems:    Positive Symptoms in bold:  Constitutional fevers, chills, weight loss, fatigue, anorexia, malaise  Eyes decreased vision, double vision, eye irritation  Ears, Nose, Mouth, Throat sore throat, trouble swallowing, sinus congestion  Cardiovascular chest pain, paroxysmal nocturnal dyspnea, lower ext edema, palpitations   Respiratory SOB, cough, DOE, hemoptysis, wheezing  Gastrointestinal nausea, vomiting, diarrhea  Genitourinary burning with urination, trouble urinating  Musculoskeletal joint aches, joint swelling, back pain  Integumentary  rashes, skin lesions  Neurological focal weakness, focal numbness, trouble speaking, headaches  Psychiatric depression, anxiety, confusion  Endocrine polyuria, polydipsia, cold intolerance, heat intolerance  Hematologic abnormal bruising, abnormal bleeding, unexplained nose bleeds  Allergic/Immunologic recurrent infections, hives, swollen lymph nodes     Past Medical  History:  She,  has a past medical history of Arthritis, Complication of anesthesia, Depression, Dyslipidemia, GERD (gastroesophageal reflux disease), HTN (hypertension), Multiple osteochondroma, Needle phobia, PONV (postoperative nausea and vomiting), Rectal bleeding, and Suspicious mole.   Surgical History:   Past Surgical History:  Procedure Laterality Date   bone tumors     Multiple excisions-all over body "occ. bones lock up"   CHOLECYSTECTOMY     COLONOSCOPY WITH PROPOFOL N/A 06/02/2013   Procedure: COLONOSCOPY WITH PROPOFOL;  Surgeon: Shirley Friar, MD;  Location: [facility] ENDOSCOPY;  Service: Endoscopy;  Laterality: N/A;   COLONOSCOPY WITH PROPOFOL N/A 05/07/2017   Procedure: COLONOSCOPY WITH PROPOFOL;  Surgeon: Charlott Rakes, MD;  Location: [facility] ENDOSCOPY;  Service: Endoscopy;  Laterality: N/A;   HOT HEMOSTASIS N/A 05/07/2017   Procedure: HOT HEMOSTASIS (ARGON PLASMA COAGULATION/BICAP);  Surgeon: Charlott Rakes, MD;  Location: [facility] ENDOSCOPY;  Service: Endoscopy;  Laterality: N/A;     Social History:   reports that she has been smoking cigarettes. She has a 40 pack-year smoking history. She has never used smokeless tobacco. She reports current drug use. Frequency: 7.00 times per week. Drug: Marijuana. She reports that she does not drink alcohol.   Family History:  Her family history includes CAD in her sister; Heart disease in her brother.   Allergies Allergies  Allergen Reactions   Codeine      Home Medications  Prior to Admission medications   Medication Sig Start Date End Date Taking? Authorizing Provider  cetirizine (ZYRTEC) 10 MG tablet Take 10 mg by mouth daily.   Yes [provider]  clonazePAM (KLONOPIN) 0.5 MG tablet Take 0.5 mg by mouth 3 (three) times daily as needed. 04/11/20  Yes [provider]  tiZANidine (ZANAFLEX) 4 MG tablet Take 4 mg by mouth every 8 (eight) hours as needed.   Yes [provider]  busPIRone (BUSPAR) 10 MG  tablet Take 10 mg by mouth daily. Take 1 daily and 1 as needed for anxiety Patient not taking: Reported on 06/24/2023    [provider]  QUEtiapine (SEROQUEL) 50 MG tablet Take 50 mg by mouth at bedtime. Patient not taking: Reported on 06/24/2023    [provider]  venlafaxine XR (EFFEXOR XR) 75 MG 24 hr capsule Take 75 mg by mouth daily with breakfast. Patient not taking: Reported on 06/24/2023    [provider]     Critical care time: N/A

## 2023-06-24 NOTE — Consult Note (Signed)
[facility] Initial Consultation Note  Dana Roth WUX:[id number] DOB: Jul 30, 1960 DOA: 06/23/2023  PCP: Norm Salt, PA   Requesting Physician: Rolly Pancake  Reason for Consultation: Hypoxia, lung mass  HPI: Dana Roth is a 62 y.o. female with medical history significant for GERD, depression, tobacco abuse who presented to the emergency department for evaluation of a newly discovered large left perihilar lung mass.  Patient states that she has been a smoker for the last 45 years, on average about 1 pack/day.  For the last 10 days, she has been having wheezing, nonproductive cough, noticed weight loss in the last few months of about 4 pounds.  She denies any fevers, chills, sputum production, or hemoptysis.  She had a couple of episodes of paroxysmal cough when she had difficulty catching her breath.  Denies any syncope, chest pain.  She saw her primary care doctor for these complaints yesterday, she had a chest x-ray done which showed evidence of a left-sided perihilar mass, and she was brought to the emergency department by EMS.  Workup as detailed below shows evidence of a 4.7 cm left perihilar mass.  Patient has not been hypoxic here in the emergency department, but was given IV steroids and breathing treatments due to wheezing.  Initially, hospitalist was contacted for admission, I have discussed the case with pulmonology who has also seen in consultation.  Currently patient is resting comfortably on room air, has required anxiolytics but is now feeling much better.  Review of Systems: Please see HPI for pertinent positives and negatives. A complete 10 system review of systems are otherwise negative.  Past Medical History:  Diagnosis Date   Arthritis    Complication of anesthesia    problem with B/P dropping   Depression    Dyslipidemia    GERD (gastroesophageal reflux disease)    HTN (hypertension)    "only with MD visits, caused by anxiety",med taken caused chest  pain-so stopped.   Multiple osteochondroma    Multiple bone surgeries   Needle phobia    PONV (postoperative nausea and vomiting)    Rectal bleeding    Suspicious mole    Past Surgical History:  Procedure Laterality Date   bone tumors     Multiple excisions-all over body "occ. bones lock up"   CHOLECYSTECTOMY     COLONOSCOPY WITH PROPOFOL N/A 06/02/2013   Procedure: COLONOSCOPY WITH PROPOFOL;  Surgeon: Shirley Friar, MD;  Location: [facility] ENDOSCOPY;  Service: Endoscopy;  Laterality: N/A;   COLONOSCOPY WITH PROPOFOL N/A 05/07/2017   Procedure: COLONOSCOPY WITH PROPOFOL;  Surgeon: Charlott Rakes, MD;  Location: [facility] ENDOSCOPY;  Service: Endoscopy;  Laterality: N/A;   HOT HEMOSTASIS N/A 05/07/2017   Procedure: HOT HEMOSTASIS (ARGON PLASMA COAGULATION/BICAP);  Surgeon: Charlott Rakes, MD;  Location: [facility] ENDOSCOPY;  Service: Endoscopy;  Laterality: N/A;    Social History:  reports that she has been smoking cigarettes. She has a 40 pack-year smoking history. She has never used smokeless tobacco. She reports current drug use. Frequency: 7.00 times per week. Drug: Marijuana. She reports that she does not drink alcohol.  Allergies  Allergen Reactions   Codeine     Family History  Problem Relation Age of Onset   Heart disease Brother        "Hole in heart"   CAD Sister      Prior to Admission medications   Medication Sig Start Date End Date Taking? Authorizing Provider  albuterol (VENTOLIN HFA) 108 (90 Base) MCG/ACT inhaler Inhale 2 puffs  into the lungs every 6 (six) hours as needed for wheezing or shortness of breath. 06/24/23  Yes Kirby Crigler, Trentan Trippe M, MD  doxycycline (VIBRA-TABS) 100 MG tablet Take 1 tablet (100 mg total) by mouth 2 (two) times daily. 06/24/23  Yes Kirby Crigler, Edin Kon M, MD  LORazepam (ATIVAN) 0.5 MG tablet Take 1 tablet (0.5 mg total) by mouth every 8 (eight) hours as needed for anxiety. 06/24/23 07/24/23 Yes Hao Dion M, MD  predniSONE (STERAPRED UNI-PAK 21 TAB) 10  MG (21) TBPK tablet Per package instructions. 06/24/23  Yes Kirby Crigler, Keyna Blizard M, MD  nicotine (NICODERM CQ - DOSED IN MG/24 HOURS) 14 mg/24hr patch Place 1 patch (14 mg total) onto the skin once for 1 dose. 06/24/23 06/24/23  Maryln Gottron, MD    Physical Exam: BP (!) 160/75   Pulse (!) 101   Temp 98.3 F (36.8 C)   Resp (!) 26   Ht 5\' 4"  (1.626 m)   Wt 76.7 kg   SpO2 97%   BMI 29.01 kg/m   General:  Alert, oriented, calm, in no acute distress, looks slightly anxious but pleasant and cooperative Cardiovascular: RRR, no murmurs or rubs, no peripheral edema  Respiratory: He has good equal bilateral air entry, without tachypnea or respiratory distress.  She has left-sided rhonchi, and some expiratory wheezing. Abdomen: soft, nontender, nondistended, normal bowel tones heard  Skin: dry, no rashes  Musculoskeletal: no joint effusions, normal range of motion  Psychiatric: appropriate affect, normal speech  Neurologic: extraocular muscles intact, clear speech, moving all extremities with intact sensorium         Recent Labs and Imaging Reviewed:  Basic Metabolic Panel: Recent Labs  Lab 06/23/23 1842  NA 131*  K 3.6  CL 97*  CO2 27  GLUCOSE 133*  BUN 11  CREATININE 0.68  CALCIUM 9.3   Liver Function Tests: Recent Labs  Lab 06/23/23 1842  AST 18  ALT 23  ALKPHOS 90  BILITOT 0.5  PROT 7.4  ALBUMIN 4.2   No results for input(s): "LIPASE", "AMYLASE" in the last 168 hours. No results for input(s): "AMMONIA" in the last 168 hours. CBC: Recent Labs  Lab 06/23/23 1842  WBC 7.7  NEUTROABS 5.4  HGB 15.1*  HCT 45.5  MCV 89.2  PLT 187   Cardiac Enzymes: No results for input(s): "CKTOTAL", "CKMB", "CKMBINDEX", "TROPONINI" in the last 168 hours.  BNP (last 3 results) No results for input(s): "BNP" in the last 8760 hours.  ProBNP (last 3 results) No results for input(s): "PROBNP" in the last 8760 hours.  CBG: No results for input(s): "GLUCAP" in the last 168  hours.  Radiological Exams on Admission: CT Angio Chest/Abd/Pel for Dissection W and/or Wo Contrast  Result Date: 06/23/2023 CLINICAL DATA:  Shortness of breath possible lung mass EXAM: CT ANGIOGRAPHY CHEST, ABDOMEN AND PELVIS TECHNIQUE: Non-contrast CT of the chest was initially obtained. Multidetector CT imaging through the chest, abdomen and pelvis was performed using the standard protocol during bolus administration of intravenous contrast. Multiplanar reconstructed images and MIPs were obtained and reviewed to evaluate the vascular anatomy. RADIATION DOSE REDUCTION: This exam was performed according to the departmental dose-optimization program which includes automated exposure control, adjustment of the mA and/or kV according to patient size and/or use of iterative reconstruction technique. CONTRAST:  OMNIPAQUE IOHEXOL 350 MG/ML SOLN COMPARISON:  Chest x-ray 06/23/2023 FINDINGS: CTA CHEST FINDINGS Cardiovascular: Non contrasted images of the chest demonstrates no acute intramural hematoma. Moderate aortic atherosclerosis. No aneurysm or dissection is  seen. Normal cardiac size. No pericardial effusion. Narrowing and in case mint of left pulmonary arterial vessels by soft tissue mass. Mediastinum/Nodes: Patent trachea. No thyroid nodule. Enlarged precarinal lymph node measuring 18 mm. Small right hilar nodes measuring up to 9 mm. Multiple enlarged prevascular nodes measuring up to 13 mm. Left paratracheal nodes measuring 14 mm. Large left hilar/suprahilar mass measuring approximately 6.8 x 6.2 by 7.4 cm and concerning for carcinoma. Lungs/Pleura: Emphysema. No pleural effusion or pneumothorax. Irregular left apical density measuring 24 x 17 mm on series 16, image 42, suspicious for satellite mass. Musculoskeletal: No acute or suspicious osseous abnormality Review of the MIP images confirms the above findings. CTA ABDOMEN AND PELVIS FINDINGS VASCULAR Aorta: Normal caliber aorta without aneurysm,  dissection, vasculitis or significant stenosis. Moderate atherosclerosis Celiac: Patent without evidence of aneurysm, dissection, vasculitis or significant stenosis. Left gastric artery appears to take origin from the aorta directly above the celiac trunk. SMA: Mild stenosis of the proximal SMA slightly distal to the origin. Widely patent distally. Renals: Both renal arteries are patent without evidence of aneurysm, dissection, vasculitis, fibromuscular dysplasia or significant stenosis. IMA: Patent without evidence of aneurysm, dissection, vasculitis or significant stenosis. Inflow: Patent without evidence of aneurysm, dissection, vasculitis or significant stenosis. Moderate atherosclerosis. Veins: Poorly assessed Review of the MIP images confirms the above findings. NON-VASCULAR Hepatobiliary: Cholecystectomy. Hepatic steatosis. No biliary dilatation Pancreas: Unremarkable. No pancreatic ductal dilatation or surrounding inflammatory changes. Spleen: Normal in size without focal abnormality. Adrenals/Urinary Tract: Kidneys show no hydronephrosis. 12 mm indeterminate right adrenal nodule. Multiple left adrenal nodules, posterior lesion measures mm, anterior lesion measures 2.6 cm and demonstrates fat density value consistent with adenoma. Kidneys show no hydronephrosis. Cyst in the left kidney for which no imaging follow-up is recommended. The bladder is normal Stomach/Bowel: The stomach is nonenlarged. No dilated small bowel. No acute bowel wall thickening. Diverticular disease of the colon. Negative appendix. Lymphatic: No suspicious lymph nodes. Reproductive: Uterus unremarkable.  No adnexal mass Other: Negative for pelvic effusion or free air. Benign-appearing smooth circumscribed partially calcified nodule in the central pelvis measuring 15 mm with central fat density. Musculoskeletal: No acute or suspicious osseous abnormality. Advanced degenerative changes of the lower lumbar spine Review of the MIP images  confirms the above findings. IMPRESSION: 1. Negative for aortic dissection or aneurysm. Moderate atherosclerosis without significant stenotic lesion within the abdomen or pelvis. 2. Large left hilar/suprahilar mass measuring up to 7.4 cm, concerning for carcinoma. There is narrowing and encasement of left pulmonary arterial vessels by soft tissue mass. There is a 24 mm irregular left apical density suspicious for a satellite mass. Multiple enlarged mediastinal lymph nodes concerning for metastatic disease. Multi disciplinary thoracic consultation is recommended. 3. Emphysema. 4. Hepatic steatosis. 5. Diverticular disease of the colon without acute inflammatory process. 6. 12 mm indeterminate right adrenal nodule. This may be assessed with nonemergent adrenal CT. Multiple left adrenal nodules, largest measuring 2.6 cm and most likely representing adenoma. Aortic Atherosclerosis (ICD10-I70.0) and Emphysema (ICD10-J43.9). Electronically Signed   By: Jasmine Pang M.D.   On: 06/23/2023 21:21   DG Chest Port 1 View  Result Date: 06/23/2023 CLINICAL DATA:  Shortness of breath EXAM: PORTABLE CHEST 1 VIEW COMPARISON:  X-ray 12/10/2012 FINDINGS: No pneumothorax or effusion. No edema. Normal cardiopericardial silhouette. There is masslike enlargement seen along the left hilum and mediastinum. This has worrisome for potential malignancy versus vascular lesion and recommend follow up CT angiogram/postcontrast chest CT. Overlapping cardiac leads. Slight basilar atelectasis. IMPRESSION: Widened  mediastinum with masslike area along the left side obscuring the aortic knob in the lung hilum. This has a differential including a mass versus a vascular lesion recommend CT angiogram of the chest/postcontrast CT of the chest to further delineate. Electronically Signed   By: Karen Kays M.D.   On: 06/23/2023 18:38    Summary and Recommendations: Dana Roth is a 62 y.o. female with medical history significant for GERD,  depression, tobacco abuse who presented to the emergency department for evaluation of a newly discovered large left perihilar lung mass.   Left perihilar lung mass-highly concerning for malignancy given associated lymphadenopathy and tobacco abuse history.  Discussed with Dr. Myrla Halsted of pulmonology, who has been kind enough to see the patient in consultation this morning and recommends outpatient bronchoscopy.  Anticipate outpatient bronchoscopy with Dr. Tonia Brooms next week.  Patient will be contacted by pulmonary to arrange this.  There is also a left apical density concerning for satellite lesion.  Patient will be discharged home today with oral doxycycline, prednisone taper, and albuterol inhaler as needed.  Depression and anxiety-I feel this is contributing to her current tachycardia, no evidence of sepsis, cardiac stability, etc.  Will discharge her on a short course of low-dose p.o. Ativan as needed.  Hypertension and tachycardia-likely due to her baseline anxiety/depression, and exacerbated by recent stressors including unexpected death of her friend last week, and discovery of her lung mass.  Thank you for involving [place] in the care of your patient. [facility] will continue to follow along with you.  Time spent: 59 minutes  Ukiah Trawick Sharlette Dense MD [facility] Pager [phone]  If 7PM-7AM, please contact night-coverage [email or web address] Password [email or web address]  06/24/2023, 10:51 AM

## 2023-06-24 NOTE — ED Provider Notes (Signed)
I was contacted by nursing because the patient is anxious to the point of wanting to leave AMA.  I have reviewed her chart and it appears that she does smoke.  I have ordered her nicotine patches and Nicorette gum in case this is due to her smoking.  Also have ordered her some Ativan.   Rondel Baton, MD 06/24/23 [phone]

## 2023-06-26 ENCOUNTER — Other Ambulatory Visit: Payer: Self-pay

## 2023-06-26 ENCOUNTER — Encounter (HOSPITAL_COMMUNITY): Payer: Self-pay | Admitting: Pulmonary Disease

## 2023-06-26 NOTE — Progress Notes (Signed)
PCP - Norva Riffle   EKG - 06/23/2023 Chest x-ray - 06/23/2023 ECHO - Denies Cardiac Cath - Denies  Sleep Study- Denies   DM - Denies  ERAS Protcol - NPO  COVID TEST- N?A  Anesthesia review: Yes  -------------  SDW INSTRUCTIONS:  Your procedure is scheduled on Tuesday Dec 10th. Please report to [facility] Main Entrance "A" at 1300 P.M., and check in at the Admitting office. Call this number if you have problems the morning of surgery: [phone]   Remember: Do not eat or drink after midnight the night before your surgery    Medications to take morning of surgery with a sip of water include: doxycycline (VIBRA-TABS)  predniSONE (STERAPRED UNI-PAK 21   IF NEEDED albuterol (VENTOLIN HFA)   Please bring it with you on Tuesday LORazepam (ATIVAN) 0.5 MG tablet   As of today, STOP taking any Aspirin (unless otherwise instructed by your surgeon), Aleve, Naproxen, Ibuprofen, Motrin, Advil, Goody's, BC's, all herbal medications, fish oil, and all vitamins.    The Morning of Surgery Do not wear jewelry, make-up or nail polish. Do not wear lotions, powders, or perfumes/colognes, or deodorant Do not bring valuables to the hospital. [facility] is not responsible for any belongings or valuables.  If you are a smoker, DO NOT Smoke 24 hours prior to surgery  If you wear a CPAP at night please bring your mask the morning of surgery   Remember that you must have someone to transport you home after your surgery, and remain with you for 24 hours if you are discharged the same day.  Please bring cases for contacts, glasses, hearing aids, dentures or bridgework because it cannot be worn into surgery.   Patients discharged the day of surgery will not be allowed to drive home.   Please shower the NIGHT BEFORE/MORNING OF SURGERY (use antibacterial soap like DIAL soap if possible). Wear comfortable clothes the morning of surgery. Oral Hygiene is also important to reduce your risk of  infection.  Remember - BRUSH YOUR TEETH THE MORNING OF SURGERY WITH YOUR REGULAR TOOTHPASTE  Patient denies shortness of breath, fever, cough and chest pain.

## 2023-06-29 NOTE — Progress Notes (Signed)
Pt called me this morning to let me know that if her biopsy comes back positive for cancer, she will go to [place]. Pt was also concerned because a nurse from [facility] called her to review her pre-procedure instructions and the nurse told her she was not allowed to take her Klonopin, even though I had confirmed with a different nurse that it was okay to take. I told the pt I would call [facility] again to confirm.  I also told the pt that she is clearly struggling to breath, she was audibly short of breath and wheezing. Pt said she has used her inhaler and it takes some time to kick in, and that she was like this "all week". I told the pt she did not sound this SOB on 12/6 the first time we spoke. I advised the pt to go to the ER or UC if it gets worse. Pt verbalized appreciation for my concern.  8:46: I called Damirez at [facility] to let her know that the pt would prefer to go there for her cancer treatment. Damirez has scheduled pt for an appt with Dr Melvyn Neth on 12/19 at 2:45. I let Damirez know I would have our scheduler move the referral to her location. 9:04 I called the surgical short stay unit at [facility] and spoke to [facility], who states that it is fine for her to take her klonopin if it's going to help her.  9:08 I called the pt back to let her know I spoke to the nurses at the surgical unit and it is fine for her to take her klonopin. I also let her know that he appt with Dr.Lewis has been scheduled for 12/19 at 2;45. Pt states she is having a panic attack, which is making her breathing sound worse. I asked the pt if she lives alone, or was alone right now, she states she has someone with her and will wake them up. I agreed that the pt should do so and once again encouraged her to go to the ER or nearest UC if her breathing continues to worsen. Pt verbalized understanding. I encouraged the pt to call me if she has any additional questions or concerns.

## 2023-06-29 NOTE — Progress Notes (Signed)
12/6 11:07--I called the pt to introduce myself and inquire about making a new pt appt with Dr Arbutus Ped on 12/16. Pt wasn't sure at first why she needed that appt. I advised her that in the event her biopsy comes back positive with cancer, at least she'll have an appt to establish care. Pt has extreme anxiety attacks and wanted to know if she would be able to take her daily klonopin the day of her biopsy. I told the pt I would contact the endobronchial suite where she would be having her biopsy done and get back with her. I also mentioned that there is a [facility] in [place] if she would like to go there as it is much closer to where she lives. Pt states that she would be willing to do that, but only if she could get in with Dr.Lewis. I told the pt I would also reach out to [place] to see if they have availability.  I spoke to [facility] in the surgical short stay shortly after talking with the pt who let me know the pt can take her klonopin the day of her biopsy.  1:24: I called [place] new referral coordinator, Renae Fickle, who states Dr Melvyn Neth has a slot on 12/19 in the afternoon the pt could be seen. Damirez took the pts information and I told her I would have the referral sent to her.  1:29: I called the pt back to let her know that she can take her Klonopin and that Dr Melvyn Neth has availability on 12/19. Pt appreciated the information provided, however is torn between wanting to go to [place] or coming here to [facility] for her care. Pt requested that she have the weekend to think about it and will call me on Monday 12/9 with an answer.  I called Damirez back and ask that she hold the spot until Monday 12/9 and after I have spoken to the pt, I'll give her a call back to let her know if the pt will be treated in [place] or [place].

## 2023-06-30 ENCOUNTER — Ambulatory Visit (HOSPITAL_COMMUNITY): Payer: Self-pay | Admitting: Physician Assistant

## 2023-06-30 ENCOUNTER — Ambulatory Visit (HOSPITAL_COMMUNITY): Payer: Medicaid Other | Admitting: Physician Assistant

## 2023-06-30 ENCOUNTER — Ambulatory Visit (HOSPITAL_COMMUNITY)
Admission: RE | Admit: 2023-06-30 | Discharge: 2023-06-30 | Disposition: A | Discharge status: Home / Self Care | Payer: Medicaid Other | Attending: Pulmonary Disease | Admitting: Pulmonary Disease

## 2023-06-30 ENCOUNTER — Ambulatory Visit (HOSPITAL_COMMUNITY): Payer: Medicaid Other

## 2023-06-30 ENCOUNTER — Encounter (HOSPITAL_COMMUNITY): Payer: Self-pay | Admitting: Pulmonary Disease

## 2023-06-30 ENCOUNTER — Encounter (HOSPITAL_COMMUNITY)
Admission: RE | Disposition: A | Discharge status: Home / Self Care | Payer: Self-pay | Source: Home / Self Care | Attending: Pulmonary Disease

## 2023-06-30 ENCOUNTER — Other Ambulatory Visit: Payer: Self-pay

## 2023-06-30 DIAGNOSIS — F32A Depression, unspecified: Secondary | ICD-10-CM | POA: Insufficient documentation

## 2023-06-30 DIAGNOSIS — Z79899 Other long term (current) drug therapy: Secondary | ICD-10-CM | POA: Diagnosis not present

## 2023-06-30 DIAGNOSIS — F17210 Nicotine dependence, cigarettes, uncomplicated: Secondary | ICD-10-CM | POA: Insufficient documentation

## 2023-06-30 DIAGNOSIS — I10 Essential (primary) hypertension: Secondary | ICD-10-CM | POA: Diagnosis not present

## 2023-06-30 DIAGNOSIS — E785 Hyperlipidemia, unspecified: Secondary | ICD-10-CM | POA: Insufficient documentation

## 2023-06-30 DIAGNOSIS — M1990 Unspecified osteoarthritis, unspecified site: Secondary | ICD-10-CM | POA: Diagnosis not present

## 2023-06-30 DIAGNOSIS — K219 Gastro-esophageal reflux disease without esophagitis: Secondary | ICD-10-CM | POA: Diagnosis not present

## 2023-06-30 DIAGNOSIS — C3402 Malignant neoplasm of left main bronchus: Secondary | ICD-10-CM

## 2023-06-30 DIAGNOSIS — C3412 Malignant neoplasm of upper lobe, left bronchus or lung: Secondary | ICD-10-CM | POA: Insufficient documentation

## 2023-06-30 DIAGNOSIS — R918 Other nonspecific abnormal finding of lung field: Secondary | ICD-10-CM | POA: Diagnosis not present

## 2023-06-30 DIAGNOSIS — R062 Wheezing: Secondary | ICD-10-CM | POA: Diagnosis not present

## 2023-06-30 DIAGNOSIS — F419 Anxiety disorder, unspecified: Secondary | ICD-10-CM | POA: Diagnosis not present

## 2023-06-30 HISTORY — PX: VIDEO BRONCHOSCOPY: SHX5072

## 2023-06-30 HISTORY — PX: HEMOSTASIS CONTROL: SHX6838

## 2023-06-30 HISTORY — PX: BRONCHIAL BIOPSY: SHX5109

## 2023-06-30 SURGERY — BRONCHOSCOPY, VIDEO-ASSISTED
Anesthesia: General

## 2023-06-30 MED ORDER — ONDANSETRON HCL 4 MG/2ML IJ SOLN: 4.0000 mg | Freq: Once | INTRAMUSCULAR | Status: DC | PRN

## 2023-06-30 MED ORDER — ONDANSETRON HCL 4 MG/2ML IJ SOLN
INTRAMUSCULAR | Status: DC | PRN
  Administered 2023-06-30: 4 mg via INTRAVENOUS

## 2023-06-30 MED ORDER — FENTANYL CITRATE (PF) 100 MCG/2ML IJ SOLN
INTRAMUSCULAR | Status: AC
  Filled 2023-06-30: qty 2

## 2023-06-30 MED ORDER — SUGAMMADEX SODIUM 200 MG/2ML IV SOLN
INTRAVENOUS | Status: DC | PRN
  Administered 2023-06-30 (×2): 200 mg via INTRAVENOUS

## 2023-06-30 MED ORDER — DEXAMETHASONE SODIUM PHOSPHATE 10 MG/ML IJ SOLN
INTRAMUSCULAR | Status: DC | PRN
  Administered 2023-06-30: 10 mg via INTRAVENOUS

## 2023-06-30 MED ORDER — FENTANYL CITRATE (PF) 250 MCG/5ML IJ SOLN
INTRAMUSCULAR | Status: DC | PRN
  Administered 2023-06-30: 100 ug via INTRAVENOUS

## 2023-06-30 MED ORDER — DROPERIDOL 2.5 MG/ML IJ SOLN
INTRAMUSCULAR | Status: AC
  Filled 2023-06-30: qty 2

## 2023-06-30 MED ORDER — CHLORHEXIDINE GLUCONATE 0.12 % MT SOLN
15.0000 mL | Freq: Once | OROMUCOSAL | Status: AC
  Administered 2023-06-30: 15 mL via OROMUCOSAL

## 2023-06-30 MED ORDER — GLYCOPYRROLATE PF 0.2 MG/ML IJ SOSY
PREFILLED_SYRINGE | INTRAMUSCULAR | Status: DC | PRN
  Administered 2023-06-30: .4 mg via INTRAVENOUS

## 2023-06-30 MED ORDER — FENTANYL CITRATE (PF) 100 MCG/2ML IJ SOLN: 25.0000 ug | INTRAMUSCULAR | Status: DC | PRN

## 2023-06-30 MED ORDER — MIDAZOLAM HCL 2 MG/2ML IJ SOLN
INTRAMUSCULAR | Status: AC
  Filled 2023-06-30: qty 2

## 2023-06-30 MED ORDER — SODIUM CHLORIDE 0.9 % IV SOLN: INTRAVENOUS | Status: DC | PRN

## 2023-06-30 MED ORDER — SODIUM CHLORIDE (PF) 0.9 % IJ SOLN
PREFILLED_SYRINGE | INTRAMUSCULAR | Status: DC | PRN
  Administered 2023-06-30: 2 mL

## 2023-06-30 MED ORDER — ROCURONIUM BROMIDE 10 MG/ML (PF) SYRINGE
PREFILLED_SYRINGE | INTRAVENOUS | Status: DC | PRN
  Administered 2023-06-30: 50 mg via INTRAVENOUS

## 2023-06-30 MED ORDER — IPRATROPIUM-ALBUTEROL 0.5-2.5 (3) MG/3ML IN SOLN
3.0000 mL | Freq: Once | RESPIRATORY_TRACT | Status: AC
  Administered 2023-06-30: 3 mL via RESPIRATORY_TRACT

## 2023-06-30 MED ORDER — PROPOFOL 10 MG/ML IV BOLUS
INTRAVENOUS | Status: DC | PRN
  Administered 2023-06-30: 125 ug/kg/min via INTRAVENOUS
  Administered 2023-06-30: 150 mg via INTRAVENOUS

## 2023-06-30 MED ORDER — PHENYLEPHRINE 80 MCG/ML (10ML) SYRINGE FOR IV PUSH (FOR BLOOD PRESSURE SUPPORT)
PREFILLED_SYRINGE | INTRAVENOUS | Status: DC | PRN
  Administered 2023-06-30 (×2): 80 ug via INTRAVENOUS

## 2023-06-30 MED ORDER — IPRATROPIUM-ALBUTEROL 0.5-2.5 (3) MG/3ML IN SOLN
RESPIRATORY_TRACT | Status: AC
  Filled 2023-06-30: qty 3

## 2023-06-30 MED ORDER — DROPERIDOL 2.5 MG/ML IJ SOLN
0.6250 mg | Freq: Once | INTRAMUSCULAR | Status: AC | PRN
  Administered 2023-06-30: 0.625 mg via INTRAVENOUS

## 2023-06-30 MED ORDER — LACTATED RINGERS IV SOLN
INTRAVENOUS | Status: DC
Start: 2023-06-30 — End: 2023-06-30

## 2023-06-30 MED ORDER — LIDOCAINE 2% (20 MG/ML) 5 ML SYRINGE
INTRAMUSCULAR | Status: DC | PRN
  Administered 2023-06-30: 100 mg via INTRAVENOUS

## 2023-06-30 MED ORDER — MIDAZOLAM HCL 2 MG/2ML IJ SOLN
INTRAMUSCULAR | Status: DC | PRN
  Administered 2023-06-30: 2 mg via INTRAVENOUS

## 2023-06-30 SURGICAL SUPPLY — 28 items
BRUSH CYTOL CELLEBRITY 1.5X140 (MISCELLANEOUS) IMPLANT
CANISTER SUCT 3000ML PPV (MISCELLANEOUS) ×2 IMPLANT
CONT SPEC 4OZ CLIKSEAL STRL BL (MISCELLANEOUS) ×2 IMPLANT
COVER BACK TABLE 60X90IN (DRAPES) ×2 IMPLANT
COVER DOME SNAP 22 D (MISCELLANEOUS) ×2 IMPLANT
FORCEPS BIOP RJ4 1.8 (CUTTING FORCEPS) IMPLANT
GAUZE SPONGE 4X4 12PLY STRL (GAUZE/BANDAGES/DRESSINGS) ×2 IMPLANT
GLOVE BIO SURGEON STRL SZ7.5 (GLOVE) ×2 IMPLANT
GOWN STRL REUS W/ TWL LRG LVL3 (GOWN DISPOSABLE) ×2 IMPLANT
KIT CLEAN ENDO COMPLIANCE (KITS) ×4 IMPLANT
KIT TURNOVER KIT B (KITS) ×2 IMPLANT
MARKER SKIN DUAL TIP RULER LAB (MISCELLANEOUS) ×2 IMPLANT
NDL EBUS SONO TIP PENTAX (NEEDLE) ×2 IMPLANT
NEEDLE EBUS SONO TIP PENTAX (NEEDLE) ×2 IMPLANT
NS IRRIG 1000ML POUR BTL (IV SOLUTION) ×2 IMPLANT
OIL SILICONE PENTAX (PARTS (SERVICE/REPAIRS)) ×2 IMPLANT
PAD ARMBOARD 7.5X6 YLW CONV (MISCELLANEOUS) ×4 IMPLANT
SOL ANTI FOG 6CC (MISCELLANEOUS) ×2 IMPLANT
SYR 20CC LL (SYRINGE) ×4 IMPLANT
SYR 20ML ECCENTRIC (SYRINGE) ×4 IMPLANT
SYR 50ML SLIP (SYRINGE) IMPLANT
SYR 5ML LUER SLIP (SYRINGE) ×2 IMPLANT
TOWEL OR 17X24 6PK STRL BLUE (TOWEL DISPOSABLE) ×2 IMPLANT
TRAP SPECIMEN MUCOUS 40CC (MISCELLANEOUS) IMPLANT
TUBE CONNECTING 20X1/4 (TUBING) ×4 IMPLANT
UNDERPAD 30X30 (UNDERPADS AND DIAPERS) ×2 IMPLANT
VALVE DISPOSABLE (MISCELLANEOUS) ×2 IMPLANT
WATER STERILE IRR 1000ML POUR (IV SOLUTION) ×2 IMPLANT

## 2023-06-30 NOTE — Discharge Instructions (Signed)
Flexible Bronchoscopy, Care After This sheet gives you information about how to care for yourself after your test. Your doctor may also give you more specific instructions. If you have problems or questions, contact your doctor. Follow these instructions at home: Eating and drinking Do not eat or drink anything (not even water) for 2 hours after your test, or until your numbing medicine (local anesthetic) wears off. When your numbness is gone and your cough and gag reflexes have come back, you may: Eat only soft foods. Slowly drink liquids. The day after the test, go back to your normal diet. Driving Do not drive for 24 hours if you were given a medicine to help you relax (sedative). Do not drive or use heavy machinery while taking prescription pain medicine. General instructions  Take over-the-counter and prescription medicines only as told by your doctor. Return to your normal activities as told. Ask what activities are safe for you. Do not use any products that have nicotine or tobacco in them. This includes cigarettes and e-cigarettes. If you need help quitting, ask your doctor. Keep all follow-up visits as told by your doctor. This is important. It is very important if you had a tissue sample (biopsy) taken. Get help right away if: You have shortness of breath that gets worse. You get light-headed. You feel like you are going to pass out (faint). You have chest pain. You cough up: More than a little blood. More blood than before. Summary Do not eat or drink anything (not even water) for 2 hours after your test, or until your numbing medicine wears off. Do not use cigarettes. Do not use e-cigarettes. Get help right away if you have chest pain.  This information is not intended to replace advice given to you by your health care provider. Make sure you discuss any questions you have with your health care provider. Document Released: 05/04/2009 Document Revised: 06/19/2017 Document  Reviewed: 07/25/2016 Elsevier Patient Education  2020 Elsevier Inc.  

## 2023-06-30 NOTE — Transfer of Care (Signed)
Immediate Anesthesia Transfer of Care Note  Patient: Dana Roth  Procedure(s) Performed: VIDEO BRONCHOSCOPY BRONCHIAL BIOPSIES HEMOSTASIS CONTROL  Patient Location: PACU  Anesthesia Type:General  Level of Consciousness: awake, alert , and sedated  Airway & Oxygen Therapy: Patient Spontanous Breathing and Patient connected to face mask oxygen  Post-op Assessment: Report given to RN and Post -op Vital signs reviewed and stable  Post vital signs: Reviewed and stable  Last Vitals:  Vitals Value Taken Time  BP 143/66 06/30/23 1453  Temp 97.3   Pulse 101 06/30/23 1455  Resp 16 06/30/23 1455  SpO2 99 % 06/30/23 1455  Vitals shown include unfiled device data.  Last Pain:  Vitals:   06/30/23 1339  TempSrc:   PainSc: 0-No pain         Complications: No notable events documented.

## 2023-06-30 NOTE — Interval H&P Note (Signed)
History and Physical Interval Note:  06/30/2023 1:38 PM  Dana Roth  has presented today for surgery, with the diagnosis of LEFT LUNG MASS.  The various methods of treatment have been discussed with the patient and family. After consideration of risks, benefits and other options for treatment, the patient has consented to  Procedure(s): VIDEO BRONCHOSCOPY WITH ENDOBRONCHIAL ULTRASOUND (N/A) as a surgical intervention.  The patient's history has been reviewed, patient examined, no change in status, stable for surgery.  I have reviewed the patient's chart and labs.  Questions were answered to the patient's satisfaction.     Rachel Bo [name]

## 2023-06-30 NOTE — Anesthesia Preprocedure Evaluation (Signed)
Anesthesia Evaluation  Patient identified by MRN, date of birth, ID band Patient awake    Reviewed: Allergy & Precautions, NPO status , Patient's Chart, lab work & pertinent test results  History of Anesthesia Complications (+) PONV and history of anesthetic complications  Airway Mallampati: II  TM Distance: >3 FB     Dental  (+) Upper Dentures, Dental Advisory Given   Pulmonary Current SmokerPatient did not abstain from smoking. Left lung mass   Pulmonary exam normal breath sounds clear to auscultation       Cardiovascular hypertension, Normal cardiovascular exam Rhythm:Regular Rate:Normal     Neuro/Psych negative neurological ROS     GI/Hepatic Neg liver ROS,GERD  Medicated,,  Endo/Other  Hyperlipidemia  Renal/GU negative Renal ROS  negative genitourinary   Musculoskeletal  (+) Arthritis , Osteoarthritis,  Hx/o multiple osteochondromas   Abdominal   Peds  Hematology   Anesthesia Other Findings   Reproductive/Obstetrics                             Anesthesia Physical Anesthesia Plan  ASA: 3  Anesthesia Plan: General   Post-op Pain Management: Minimal or no pain anticipated   Induction: Intravenous  PONV Risk Score and Plan: 3 and Treatment may vary due to age or medical condition, Ondansetron, TIVA, Midazolam and Dexamethasone  Airway Management Planned: Oral ETT  Additional Equipment: None  Intra-op Plan:   Post-operative Plan: Extubation in OR  Informed Consent: I have reviewed the patients History and Physical, chart, labs and discussed the procedure including the risks, benefits and alternatives for the proposed anesthesia with the patient or authorized representative who has indicated his/her understanding and acceptance.     Dental advisory given  Plan Discussed with: CRNA and Anesthesiologist  Anesthesia Plan Comments:        Anesthesia Quick  Evaluation

## 2023-06-30 NOTE — Anesthesia Postprocedure Evaluation (Signed)
Anesthesia Post Note  Patient: Dana Roth  Procedure(s) Performed: VIDEO BRONCHOSCOPY BRONCHIAL BIOPSIES HEMOSTASIS CONTROL     Patient location during evaluation: PACU Anesthesia Type: General Level of consciousness: awake and alert Pain management: pain level controlled Vital Signs Assessment: post-procedure vital signs reviewed and stable Respiratory status: spontaneous breathing, nonlabored ventilation, respiratory function stable and patient connected to nasal cannula oxygen Cardiovascular status: blood pressure returned to baseline and stable Postop Assessment: no apparent nausea or vomiting Anesthetic complications: no   No notable events documented.  Last Vitals:  Vitals:   06/30/23 1515 06/30/23 1530  BP: 96/64 116/70  Pulse: 93 90  Resp: 17 17  Temp:  36.4 C  SpO2: 99% 95%    Last Pain:  Vitals:   06/30/23 1530  TempSrc:   PainSc: 0-No pain                 Dawanna Grauberger

## 2023-06-30 NOTE — Anesthesia Procedure Notes (Signed)
Procedure Name: Intubation Date/Time: 06/30/2023 2:04 PM  Performed by: [name], CRNAPre-anesthesia Checklist: Patient identified, Emergency Drugs available, Suction available and Patient being monitored Patient Re-evaluated:Patient Re-evaluated prior to induction Oxygen Delivery Method: Circle System Utilized Preoxygenation: Pre-oxygenation with 100% oxygen Induction Type: IV induction Ventilation: Mask ventilation without difficulty Laryngoscope Size: Mac and 3 Grade View: Grade I Tube type: Oral Tube size: 8.0 mm Number of attempts: 1 Airway Equipment and Method: Stylet and Oral airway Placement Confirmation: ETT inserted through vocal cords under direct vision, positive ETCO2 and breath sounds checked- equal and bilateral Secured at: 22 cm Tube secured with: Tape Dental Injury: Teeth and Oropharynx as per pre-operative assessment

## 2023-06-30 NOTE — Op Note (Signed)
Video Bronchoscopy with Endobronchial Ultrasound Procedure Note  Date of Operation: 06/30/2023  Pre-op Diagnosis: Lung mass   Post-op Diagnosis: Lung mass  Surgeon: Josephine Igo, DO  Assistants: None  Anesthesia: General endotracheal anesthesia  Operation: Flexible video fiberoptic bronchoscopy with endobronchial ultrasound and biopsies.  Estimated Blood Loss: Minimal  Complications: None   Indications and History: Dana Roth is a 62 y.o. female with lung mass.  The risks, benefits, complications, treatment options and expected outcomes were discussed with the patient.  The possibilities of pneumothorax, pneumonia, reaction to medication, pulmonary aspiration, perforation of a viscus, bleeding, failure to diagnose a condition and creating a complication requiring transfusion or operation were discussed with the patient who freely signed the consent.    Description of Procedure: The patient was examined in the preoperative area and history and data from the preprocedure consultation were reviewed. It was deemed appropriate to proceed.  The patient was taken to [facility] room [facility], identified as Dana Roth and the procedure verified as Flexible Video Fiberoptic Bronchoscopy.  A Time Out was held and the above information confirmed. After being taken to the operating room general anesthesia was initiated and the patient  was orally intubated. The video fiberoptic bronchoscope was introduced via the endotracheal tube and a general inspection was performed which showed normal right lung, and left lower lobe. 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  1: 10,000 dilution of epinephrine was used to soak the visible endobronchial tumor.  4 cc of solution was used.  Standard [employer] forceps was used to complete transbronchial endobronchial biopsies of the left upper lobe tumor. The patient tolerated the procedure  well without apparent complications. There was no significant blood loss. The bronchoscope was withdrawn. Anesthesia was reversed and the patient was taken to the PACU for recovery.   Samples: 1.  Left upper lobe endobronchial biopsies  Plans:  The patient will be discharged from the PACU to home when recovered from anesthesia. We will review the cytology, pathology results with the patient when they become available. Outpatient followup will be with Josephine Igo, DO.   Josephine Igo, DO [facility] 06/30/2023 2:50 PM

## 2023-07-02 ENCOUNTER — Encounter (HOSPITAL_COMMUNITY): Payer: Self-pay | Admitting: Pulmonary Disease

## 2023-07-02 LAB — CYTOLOGY - NON PAP

## 2023-07-02 NOTE — Progress Notes (Signed)
Pathology consistent with small cell carcinoma. Patient seeing SG next week.   Thanks,  BLI  Josephine Igo, DO [facility] 07/02/2023 1:18 PM

## 2023-07-07 ENCOUNTER — Ambulatory Visit: Payer: Medicaid Other | Admitting: Acute Care

## 2023-07-07 ENCOUNTER — Encounter: Payer: Self-pay | Admitting: Acute Care

## 2023-07-07 ENCOUNTER — Telehealth: Payer: Self-pay

## 2023-07-07 VITALS — BP 127/90 | HR 118 | Ht 61.0 in | Wt 167.8 lb

## 2023-07-07 DIAGNOSIS — Z9889 Other specified postprocedural states: Secondary | ICD-10-CM

## 2023-07-07 DIAGNOSIS — C3490 Malignant neoplasm of unspecified part of unspecified bronchus or lung: Secondary | ICD-10-CM | POA: Diagnosis not present

## 2023-07-07 DIAGNOSIS — F17210 Nicotine dependence, cigarettes, uncomplicated: Secondary | ICD-10-CM

## 2023-07-07 DIAGNOSIS — F419 Anxiety disorder, unspecified: Secondary | ICD-10-CM

## 2023-07-07 DIAGNOSIS — J449 Chronic obstructive pulmonary disease, unspecified: Secondary | ICD-10-CM | POA: Diagnosis not present

## 2023-07-07 DIAGNOSIS — F17200 Nicotine dependence, unspecified, uncomplicated: Secondary | ICD-10-CM

## 2023-07-07 MED ORDER — TRELEGY ELLIPTA 100-62.5-25 MCG/ACT IN AEPB: 1.0000 | INHALATION_SPRAY | Freq: Every day | RESPIRATORY_TRACT | 6 refills | Status: DC

## 2023-07-07 NOTE — Progress Notes (Signed)
History of Present Illness Dana Roth is a 62 y.o. female current every day smoker with  incidental finding of with hx of GERD, depression, heavy smoking and anxiety presenting to the [facility] ED 07/04/2023  with wheezing x 2 weeks. Baseline MMRC 0 dyspnea on no inhalers. Now MMRC 1-2 with wheezing. Imaging reveals extensive lung cancer. Pulmonary is consulted to expedite workup. No hemoptysis, + 4lbs weight loss in past month. She was scheduled for bronchoscopy with biopsies by Dr. Tonia Brooms 06/30/2023.   Synopsis 62 year old woman w/ hx of GERD, depression, heavy smoking and anxiety presenting with wheezing x 2 weeks. Baseline MMRC 0 dyspnea on no inhalers. Now MMRC 1-2 with wheezing. Imaging reveals extensive lung cancer. Pulmonary is consulted to expedite workup. No hemoptysis, + 7-8 lb weight loss in past month. Imaging done in the ED was positive for what appears to be advanced lung cancer- looks to be at least 3b. Plan was to  attempt to treat for concurrent bronchospasm although may be structural. She was treated with prednisone and Doxycycline, an outpatient referral was made to oncology, and she was scheduled for bronchoscopy 06/30/2023 for tissue diagnosis.  Patient presents today with her family to follow-up after bronchoscopy with biopsies and to ensure she has done well after the procedure.   07/07/2023 Pt. Presents for follow up after bronchoscopy with biopsies on 06/23/2023 by Dr. Tonia Brooms.  She is here with her daughter and her sister.  She has continued to smoke although is trying hard to cut down.She is clearly scared.  Patient states that she has done well since the biopsy.  No fever, hemoptysis, discolored secretions, or adverse reaction to anesthesia. We have reviewed her biopsy results from the left lung mass biopsy.  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  Patient has an anxiety disorder and is having a very hard time accepting this diagnosis.  She has required a lot of  support from her sister and her daughter. I discussed that, as the patient has not had pet imaging nor MRI brain to complete staging of her cancer that we needed to get those both ordered and done.  Patient had been called by medical oncology [facility] however deferred the offer of an appointment as she would prefer to be seen at the [facility] office as it is closer to her home.  This has caused a slight delay in her ability to get to medical oncology.  Her current appointment with oncology is for 07/09/2023 with Dr. Melvyn Neth in [place].  I have messaged Dr. Melvyn Neth and I have asked him to order the PET scan, and MRI brain to complete her staging.  There is no way either of these can be ordered and done before he sees her on the 19th and it is more convenient for the patient to have them done in [place] rather than having to drive to [place].  We did discuss that small cell cancers often respond very well to chemotherapy.  She may need some short-term radiation therapy to the left pulmonary artery soft tissue mass as there is narrowing.  I am unsure if radiation oncology is provided at the [facility] site however I trust that Dr. Melvyn Neth will refer her back for radiation if it is unavailable there, and he feels it is appropriate.  Patient has been overusing her albuterol inhaler which is undoubtably contributing to her increased anxiety and panic attacks.  While she has never been diagnosed with COPD with PFTs, I have little  doubt that she does not have obstructive disease.  She is not currently on any maintenance therapy and is audibly wheezing and short of breath despite saturations of 99% on room air. I have given her Trelegy samples for a therapeutic trial to see if this provides her with any relief of her dyspnea on exertion and her wheezing.  I have explained to her while she is on this medication the goal would be that she would not need to use her albuterol as much as she has been currently using it.  I  reinforced that we would prefer that she not use her albuterol more than 3-4 times daily.  She did verbalize understanding however she is unsure if she can decrease her use of albuterol unless the Trelegy is very effective.  I have also sent in a prescription for the Trelegy in the event it does work well for her so that she will have access to it.  I have asked her to follow-up with me if she feels it does not help so we can try a different inhaler.  She was instructed on use of Trelegy by one of the RNs in the office today.  We had a long discussion regarding smoking cessation.  Patient states that she has cut down from a pack a day to 5 cigarettes a day so we know she can do this.  I have provided her with multiple resources to help with this endeavor.  I know this is hard for her but she can do it and it is necessary in the setting of her diagnosis of small cell cancer.  Patient has follow-up with oncology per Dr. Melvyn Neth in [place] at the [facility] cancer center on 07/09/2023. Staging imaging should be ordered at that time.    Test Results: Cytology 06/23/2023 Left Lung Mass  Small Cell Carcinoma  CT angio chest abdomen pelvis 06/23/2023 Negative for aortic dissection or aneurysm. Moderate atherosclerosis without significant stenotic lesion within the abdomen or pelvis. 2. Large left hilar/suprahilar mass measuring up to 7.4 cm, concerning for carcinoma. There is narrowing and encasement of left pulmonary arterial vessels by soft tissue mass. There is a 24 mm irregular left apical density suspicious for a satellite mass. Multiple enlarged mediastinal lymph nodes concerning for metastatic disease. Multi disciplinary thoracic consultation is recommended. 3. Emphysema. 4. Hepatic steatosis. 5. Diverticular disease of the colon without acute inflammatory process. 6. 12 mm indeterminate right adrenal nodule. This may be assessed with nonemergent adrenal CT. Multiple left adrenal nodules,  largest measuring 2.6 cm and most likely representing adenoma.   Aortic Atherosclerosis (ICD10-I70.0) and Emphysema (ICD10-J43.9).       Latest Ref Rng & Units 06/23/2023    6:42 PM 12/07/2014    9:56 AM 01/03/2013    9:22 AM  CBC  WBC 4.0 - 10.5 K/uL 7.7  8.3  8.0   Hemoglobin 12.0 - 15.0 g/dL 62.9  52.8  41.3   Hematocrit 36.0 - 46.0 % 45.5  44.1  46.0   Platelets 150 - 400 K/uL 187  184  184        Latest Ref Rng & Units 06/23/2023    6:42 PM 12/07/2014    9:56 AM 01/03/2013    9:22 AM  BMP  Glucose 70 - 99 mg/dL 244  84  80   BUN 8 - 23 mg/dL 11  9  10    Creatinine 0.44 - 1.00 mg/dL 0.10  2.72  5.36   Sodium 135 -  145 mmol/L 131  138  138   Potassium 3.5 - 5.1 mmol/L 3.6  4.7  4.6   Chloride 98 - 111 mmol/L 97  102  104   CO2 22 - 32 mmol/L 27  26  25    Calcium 8.9 - 10.3 mg/dL 9.3  9.4  9.7     BNP No results found for: "BNP"  ProBNP No results found for: "PROBNP"  PFT No results found for: "FEV1PRE", "FEV1POST", "FVCPRE", "FVCPOST", "TLC", "DLCOUNC", "PREFEV1FVCRT", "PSTFEV1FVCRT"  CT Angio Chest/Abd/Pel for Dissection W and/or Wo Contrast Result Date: 06/23/2023 CLINICAL DATA:  Shortness of breath possible lung mass EXAM: CT ANGIOGRAPHY CHEST, ABDOMEN AND PELVIS TECHNIQUE: Non-contrast CT of the chest was initially obtained. Multidetector CT imaging through the chest, abdomen and pelvis was performed using the standard protocol during bolus administration of intravenous contrast. Multiplanar reconstructed images and MIPs were obtained and reviewed to evaluate the vascular anatomy. RADIATION DOSE REDUCTION: This exam was performed according to the departmental dose-optimization program which includes automated exposure control, adjustment of the mA and/or kV according to patient size and/or use of iterative reconstruction technique. CONTRAST:  OMNIPAQUE IOHEXOL 350 MG/ML SOLN COMPARISON:  Chest x-ray 06/23/2023 FINDINGS: CTA CHEST FINDINGS Cardiovascular: Non  contrasted images of the chest demonstrates no acute intramural hematoma. Moderate aortic atherosclerosis. No aneurysm or dissection is seen. Normal cardiac size. No pericardial effusion. Narrowing and in case mint of left pulmonary arterial vessels by soft tissue mass. Mediastinum/Nodes: Patent trachea. No thyroid nodule. Enlarged precarinal lymph node measuring 18 mm. Small right hilar nodes measuring up to 9 mm. Multiple enlarged prevascular nodes measuring up to 13 mm. Left paratracheal nodes measuring 14 mm. Large left hilar/suprahilar mass measuring approximately 6.8 x 6.2 by 7.4 cm and concerning for carcinoma. Lungs/Pleura: Emphysema. No pleural effusion or pneumothorax. Irregular left apical density measuring 24 x 17 mm on series 16, image 42, suspicious for satellite mass. Musculoskeletal: No acute or suspicious osseous abnormality Review of the MIP images confirms the above findings. CTA ABDOMEN AND PELVIS FINDINGS VASCULAR Aorta: Normal caliber aorta without aneurysm, dissection, vasculitis or significant stenosis. Moderate atherosclerosis Celiac: Patent without evidence of aneurysm, dissection, vasculitis or significant stenosis. Left gastric artery appears to take origin from the aorta directly above the celiac trunk. SMA: Mild stenosis of the proximal SMA slightly distal to the origin. Widely patent distally. Renals: Both renal arteries are patent without evidence of aneurysm, dissection, vasculitis, fibromuscular dysplasia or significant stenosis. IMA: Patent without evidence of aneurysm, dissection, vasculitis or significant stenosis. Inflow: Patent without evidence of aneurysm, dissection, vasculitis or significant stenosis. Moderate atherosclerosis. Veins: Poorly assessed Review of the MIP images confirms the above findings. NON-VASCULAR Hepatobiliary: Cholecystectomy. Hepatic steatosis. No biliary dilatation Pancreas: Unremarkable. No pancreatic ductal dilatation or surrounding inflammatory  changes. Spleen: Normal in size without focal abnormality. Adrenals/Urinary Tract: Kidneys show no hydronephrosis. 12 mm indeterminate right adrenal nodule. Multiple left adrenal nodules, posterior lesion measures mm, anterior lesion measures 2.6 cm and demonstrates fat density value consistent with adenoma. Kidneys show no hydronephrosis. Cyst in the left kidney for which no imaging follow-up is recommended. The bladder is normal Stomach/Bowel: The stomach is nonenlarged. No dilated small bowel. No acute bowel wall thickening. Diverticular disease of the colon. Negative appendix. Lymphatic: No suspicious lymph nodes. Reproductive: Uterus unremarkable.  No adnexal mass Other: Negative for pelvic effusion or free air. Benign-appearing smooth circumscribed partially calcified nodule in the central pelvis measuring 15 mm with central fat density. Musculoskeletal: No acute or  suspicious osseous abnormality. Advanced degenerative changes of the lower lumbar spine Review of the MIP images confirms the above findings. IMPRESSION: 1. Negative for aortic dissection or aneurysm. Moderate atherosclerosis without significant stenotic lesion within the abdomen or pelvis. 2. Large left hilar/suprahilar mass measuring up to 7.4 cm, concerning for carcinoma. There is narrowing and encasement of left pulmonary arterial vessels by soft tissue mass. There is a 24 mm irregular left apical density suspicious for a satellite mass. Multiple enlarged mediastinal lymph nodes concerning for metastatic disease. Multi disciplinary thoracic consultation is recommended. 3. Emphysema. 4. Hepatic steatosis. 5. Diverticular disease of the colon without acute inflammatory process. 6. 12 mm indeterminate right adrenal nodule. This may be assessed with nonemergent adrenal CT. Multiple left adrenal nodules, largest measuring 2.6 cm and most likely representing adenoma. Aortic Atherosclerosis (ICD10-I70.0) and Emphysema (ICD10-J43.9). Electronically  Signed   By: Jasmine Pang M.D.   On: 06/23/2023 21:21   DG Chest Port 1 View Result Date: 06/23/2023 CLINICAL DATA:  Shortness of breath EXAM: PORTABLE CHEST 1 VIEW COMPARISON:  X-ray 12/10/2012 FINDINGS: No pneumothorax or effusion. No edema. Normal cardiopericardial silhouette. There is masslike enlargement seen along the left hilum and mediastinum. This has worrisome for potential malignancy versus vascular lesion and recommend follow up CT angiogram/postcontrast chest CT. Overlapping cardiac leads. Slight basilar atelectasis. IMPRESSION: Widened mediastinum with masslike area along the left side obscuring the aortic knob in the lung hilum. This has a differential including a mass versus a vascular lesion recommend CT angiogram of the chest/postcontrast CT of the chest to further delineate. Electronically Signed   By: Karen Kays M.D.   On: 06/23/2023 18:38     Past medical hx Past Medical History:  Diagnosis Date   Arthritis    Complication of anesthesia    problem with B/P dropping   Depression    Dyslipidemia    GERD (gastroesophageal reflux disease)    HTN (hypertension)    "only with MD visits, caused by anxiety",med taken caused chest pain-so stopped.   Multiple osteochondroma    Multiple bone surgeries   Needle phobia    PONV (postoperative nausea and vomiting)    Rectal bleeding    Suspicious mole      Social History   Tobacco Use   Smoking status: Every Day    Current packs/day: 1.00    Average packs/day: 1 pack/day for 40.0 years (40.0 ttl pk-yrs)    Types: Cigarettes   Smokeless tobacco: Never  Vaping Use   Vaping status: Former  Substance Use Topics   Alcohol use: No   Drug use: Yes    Frequency: 7.0 times per week    Types: Marijuana    Comment: pt smoke marijuana everyday, last used 04/1717    Ms.Ternes reports that she has been smoking cigarettes. She has a 40 pack-year smoking history. She has never used smokeless tobacco. She reports current drug  use. Frequency: 7.00 times per week. Drug: Marijuana. She reports that she does not drink alcohol.  Tobacco Cessation: Current everyday smoker has reduced smoking from 1 pack/day to 5 cigarettes a day. She has been counseled on smoking cessation for 3 to 5 minutes today.  Past surgical hx, Family hx, Social hx all reviewed.  Current Outpatient Medications on File Prior to Visit  Medication Sig   albuterol (VENTOLIN HFA) 108 (90 Base) MCG/ACT inhaler Inhale 2 puffs into the lungs every 6 (six) hours as needed for wheezing or shortness of breath.   doxycycline (VIBRA-TABS)  100 MG tablet Take 1 tablet (100 mg total) by mouth 2 (two) times daily.   LORazepam (ATIVAN) 0.5 MG tablet Take 1 tablet (0.5 mg total) by mouth every 8 (eight) hours as needed for anxiety.   predniSONE (STERAPRED UNI-PAK 21 TAB) 10 MG (21) TBPK tablet Per package instructions.   tiZANidine (ZANAFLEX) 4 MG capsule Take 4 mg by mouth 3 (three) times daily as needed for muscle spasms.   No current facility-administered medications on file prior to visit.     Allergies  Allergen Reactions   Codeine     Review Of Systems:  Constitutional:   +  weight loss, no night sweats,  Fevers, chills, fatigue, or  lassitude.  HEENT:   No headaches,  Difficulty swallowing,  Tooth/dental problems, or  Sore throat,                No sneezing, itching, ear ache, nasal congestion, post nasal drip,   CV:  No chest pain,  Orthopnea, PND, swelling in lower extremities, anasarca, dizziness, palpitations, syncope.   GI  No heartburn, indigestion, abdominal pain, nausea, vomiting, diarrhea, change in bowel habits, + loss of appetite, No bloody stools.   Resp: + shortness of breath with exertion or at rest.  + excess mucus, + productive cough,  No non-productive cough,  No coughing up of blood.  No change in color of mucus.  + wheezing.  No chest wall deformity  Skin: no rash or lesions.  GU: no dysuria, change in color of urine, no  urgency or frequency.  No flank pain, no hematuria   MS: No  joint pain or swelling.  No decreased range of motion.  No back pain. + left shoulder pain  Psych:  + change in mood or affect. + depression , ++anxiety.  No memory loss.   Vital Signs BP (!) 127/90 (BP Location: Right Arm, Cuff Size: Normal)   Pulse (!) 118   Ht 5\' 1"  (1.549 m)   Wt 167 lb 12.8 oz (76.1 kg)   SpO2 99%   BMI 31.71 kg/m    Physical Exam:  General- No distress,  A&Ox3 ENT: No sinus tenderness, TM clear, pale nasal mucosa, no oral exudate,no post nasal drip, no LAN Cardiac: S1, S2, regular rate and rhythm, no murmur Chest: + wheeze/ , diminished per bases Abd.: Soft Non-tender, ND, BS +, Body mass index is 31.71 kg/m.  Ext: No clubbing cyanosis, edema Neuro:  normal strength, MAE x 4, A&O x 3, appropriate Skin: No rashes, warm and dry, no lesions  Psych: normal mood and behavior   Assessment/Plan New diagnosis small cell lung cancer Post bronchoscopy with biopsies Current every day smoker Anxiety disorder Plan I am glad you have done well after your procedure. Your biopsy was positive for small cell lung cancer.  You will need a PET scan and MRI Brain to complete staging of your cancer. I have messaged oncology in [place] to make sure the oncology MD is planning to do these where it is closer to home.  You have an appointment with Dr. Melvyn Neth on 07/09/2023.  He can review the plan for care with you.  I have sent two samples of Trelegy for your breathing. Take 1 puff once daily Rinse your mouth after use.  I have sent a prescription to the pharmacy for you to fill.   Use albuterol only as needed, up to 3 times daily for wheezing or shortness of breath. Try not to use more than  3 times daily.  If you need more than 3 times daily, call to be seen. You can receive free nicotine replacement therapy (patches, gum, or mints) by calling 1-800-QUIT NOW. Please call so we can get you on the path to  becoming a non-smoker. I know it is hard, but you can do this!  Hypnosis for smoking cessation  [employer] [phone]  Acupuncture for smoking cessation  [employer] [phone]   You have done a great job getting to 5 cigarettes, let's aim for o per day.  Please contact office for sooner follow up if symptoms do not improve or worsen or seek emergency care    I spent 45 minutes dedicated to the care of this patient on the date of this encounter to include pre-visit review of records, face-to-face time with the patient discussing conditions above, post visit ordering of testing, clinical documentation with the electronic health record, making appropriate referrals as documented, and communicating necessary information to the patient's healthcare team.   Bevelyn Ngo, NP 07/07/2023  2:20 PM

## 2023-07-07 NOTE — Telephone Encounter (Signed)
Patient seen in the office today and instructed on use of Trelegy.  Patient expressed understanding and demonstrated technique.  

## 2023-07-07 NOTE — Patient Instructions (Addendum)
It is good to see you today. I am glad you have done well after your procedure. Your biopsy was positive for small cell lung cancer.  You will need a PET scan and MRI Brain to complete staging of your cancer. I have messaged oncology in [place] to make sure the oncology MD is planning to do these where it is closer to home.  You have an appointment with Dr. Melvyn Neth on 07/09/2023.  He can review the plan for care with you.  I have sent two samples of Trelegy for your breathing. Take 1 puff once daily Rinse your mouth after use.  I have sent a prescription to the pharmacy for you to fill.   Use albuterol only as needed, up to 3 times daily for wheezing or shortness of breath. Try not to use more than 3 times daily.  If you need more than 3 times daily, call to be seen. You can receive free nicotine replacement therapy (patches, gum, or mints) by calling 1-800-QUIT NOW. Please call so we can get you on the path to becoming a non-smoker. I know it is hard, but you can do this!  Hypnosis for smoking cessation  [employer] [phone]  Acupuncture for smoking cessation  [employer] [phone]   You have done a great job getting to 5 cigarettes, let's aim for o per day.  Please contact office for sooner follow up if symptoms do not improve or worsen or seek emergency care

## 2023-07-08 ENCOUNTER — Other Ambulatory Visit (HOSPITAL_COMMUNITY): Payer: Self-pay

## 2023-07-08 ENCOUNTER — Telehealth: Payer: Self-pay

## 2023-07-08 NOTE — Progress Notes (Signed)
Pt had left me a VM this morning asking I call her back. I called pt back at 11:10 and the pt wanted to know that if she was going to have to pay the $4 copay for every appt she has at the cancer center. She wasn't sure if she would be able to pay it for her oncology consult tomorrow at [facility]. Pt is on SSI, I told her I did not know, but would reach out to SW for an answer.  Oncology social worker, Ronda Fairly, put me in touch with Marguerita Merles, the oncology social worker who covers [place] via [place]. Arvid Right states she checked with the front desk and the pt has the option to have the copay billed to her rather than paying at the time of the appt so she will still be able to see Dr. Melvyn Neth. Arvid Right states she will follow up with her at her appt with Dr Melvyn Neth.  I called pt back and let her know the option of having her copay billed to her so she can still see Dr.Lewis if she doesn't have the money to cover the copay at the time of the appt. I also gave her Nadiyah's name and let her know she will be following up with her tomorrow at her appt with Dr. Melvyn Neth. Pt verbalized appreciation. I encouraged pt to continue to reach out to me as needed. I will place a referral to SW as well.

## 2023-07-08 NOTE — Telephone Encounter (Signed)
*  Pulm  Pharmacy Patient Advocate Encounter   Received notification from CoverMyMeds that prior authorization for Trelegy Ellipta 100-62.5-25MCG/ACT aerosol powder  is required/requested.   Insurance verification completed.   The patient is insured through [facility] .   Per test claim: PA required; PA submitted to above mentioned insurance via CoverMyMeds Key/confirmation #/EOC [facility] Status is pending

## 2023-07-08 NOTE — Progress Notes (Signed)
[facility]  [address] [place],  [place]  [address] [phone]  Clinic Day: 07/09/2023  Referring physician: Norm Salt, PA   HISTORY OF PRESENT ILLNESS:  The patient is a 62 y.o. female who I was asked to consult upon for small cell lung cancer.  According to the patient, she has had progressive shortness of breath for the past month.  She has also had a corresponding 10 pound weight loss.  Her shortness of breath ultimately got to the point where she could not walk 20 feet before becoming extremely dyspneic.  This symptom led to her coming into the emergency room earlier this month for further evaluation.  While there, CT scans were done, which revealed a a large left hilar mass, with continuous hilar and mediastinal lymph nodes.  Ultimately, a bronchoscopy with biopsy was done, whose pathology came back consistent with small cell carcinoma.  She comes in today to go over her biopsy results and their implications.  She does complain of occasional headaches, but denies having any vision problems.  This patient has smoked as much as 2 packs of cigarettes daily for 50 years.  PAST MEDICAL HISTORY:   Past Medical History:  Diagnosis Date   Arthritis    Complication of anesthesia    problem with B/P dropping   Depression    Dyslipidemia    GERD (gastroesophageal reflux disease)    HTN (hypertension)    "only with MD visits, caused by anxiety",med taken caused chest pain-so stopped.   Multiple osteochondroma    Multiple bone surgeries   Needle phobia    PONV (postoperative nausea and vomiting)    Rectal bleeding    Small cell lung cancer (HCC)    Suspicious mole     PAST SURGICAL HISTORY:   Past Surgical History:  Procedure Laterality Date   bone tumors     Multiple excisions-all over body "occ. bones lock up"   BRONCHIAL BIOPSY  06/30/2023   Procedure: BRONCHIAL BIOPSIES;  Surgeon: Josephine Igo, DO;  Location: [facility] ENDOSCOPY;   Service: Pulmonary;;   CHOLECYSTECTOMY     COLONOSCOPY WITH PROPOFOL N/A 06/02/2013   Procedure: COLONOSCOPY WITH PROPOFOL;  Surgeon: Shirley Friar, MD;  Location: [facility] ENDOSCOPY;  Service: Endoscopy;  Laterality: N/A;   COLONOSCOPY WITH PROPOFOL N/A 05/07/2017   Procedure: COLONOSCOPY WITH PROPOFOL;  Surgeon: Charlott Rakes, MD;  Location: [facility] ENDOSCOPY;  Service: Endoscopy;  Laterality: N/A;   HEMOSTASIS CONTROL  06/30/2023   Procedure: HEMOSTASIS CONTROL;  Surgeon: Josephine Igo, DO;  Location: [facility] ENDOSCOPY;  Service: Pulmonary;;   HOT HEMOSTASIS N/A 05/07/2017   Procedure: HOT HEMOSTASIS (ARGON PLASMA COAGULATION/BICAP);  Surgeon: Charlott Rakes, MD;  Location: [facility] ENDOSCOPY;  Service: Endoscopy;  Laterality: N/A;   VIDEO BRONCHOSCOPY N/A 06/30/2023   Procedure: VIDEO BRONCHOSCOPY;  Surgeon: Josephine Igo, DO;  Location: [facility] ENDOSCOPY;  Service: Pulmonary;  Laterality: N/A;    CURRENT MEDICATIONS:   Current Outpatient Medications  Medication Sig Dispense Refill   hydrocortisone 2.5 % cream 1 application Rectally once to three times a day for 30 day(s)     polyethylene glycol powder (MIRALAX) 17 GM/SCOOP powder 1 scoop in 8 oz of any liquid Orally Once a day for 30 day(s)     albuterol (VENTOLIN HFA) 108 (90 Base) MCG/ACT inhaler Inhale 2 puffs into the lungs every 6 (six) hours as needed for wheezing or shortness of breath. 8 g 2   busPIRone (BUSPAR) 10 MG  tablet 1 tablet Orally once a day as needed     clonazePAM (KLONOPIN) 0.5 MG tablet Take 0.5 mg by mouth 3 (three) times daily as needed.     cyclobenzaprine (FLEXERIL) 5 MG tablet 1 tablet as needed Orally Once a day     doxycycline (VIBRA-TABS) 100 MG tablet Take 1 tablet (100 mg total) by mouth 2 (two) times daily. 14 tablet 0   Fluticasone-Umeclidin-Vilant (TRELEGY ELLIPTA) 100-62.5-25 MCG/ACT AEPB Inhale 1 puff into the lungs daily. 1 each 6   gabapentin (NEURONTIN) 400 MG capsule 2 capsules Orally Once a day      LORazepam (ATIVAN) 0.5 MG tablet Take 1 tablet (0.5 mg total) by mouth every 8 (eight) hours as needed for anxiety. 90 tablet 0   meloxicam (MOBIC) 15 MG tablet 1 tablet Orally Once a day     predniSONE (STERAPRED UNI-PAK 21 TAB) 10 MG (21) TBPK tablet Per package instructions. 1 each 0   tiZANidine (ZANAFLEX) 4 MG capsule Take 4 mg by mouth 3 (three) times daily as needed for muscle spasms.     No current facility-administered medications for this visit.    ALLERGIES:   Allergies  Allergen Reactions   Codeine     FAMILY HISTORY:   Family History  Problem Relation Age of Onset   Heart disease Brother        "Hole in heart"   CAD Sister     SOCIAL HISTORY:  The patient was born and raised in [place].  She currently lives in [place].  She is divorced, with no children.  She is unemployed.  She occasionally drinks alcohol.  She does smoke occasional marijuana.  Her smoking history is as mentioned per HPI.  REVIEW OF SYSTEMS:  Review of Systems  Constitutional:  Negative for fatigue and fever.  HENT:   Negative for hearing loss and sore throat.   Eyes:  Negative for eye problems.  Respiratory:  Positive for shortness of breath. Negative for chest tightness, cough and hemoptysis.   Cardiovascular:  Positive for chest pain. Negative for palpitations.  Gastrointestinal:  Positive for constipation. Negative for abdominal distention, abdominal pain, blood in stool, diarrhea, nausea and vomiting.  Endocrine: Negative for hot flashes.  Genitourinary:  Positive for dysuria. Negative for difficulty urinating, frequency, hematuria and nocturia.   Musculoskeletal:  Positive for back pain and gait problem. Negative for arthralgias and myalgias.  Skin: Negative.  Negative for itching and rash.  Neurological:  Positive for dizziness, gait problem and headaches. Negative for extremity weakness, light-headedness and numbness.  Hematological: Negative.   Psychiatric/Behavioral:  Positive for  depression. Negative for suicidal ideas. The patient is nervous/anxious.      PHYSICAL EXAM:  Blood pressure (!) 157/83, pulse 66, temperature 98.1 F (36.7 C), temperature source Oral, resp. rate 16, height 5\' 2"  (1.575 m), weight 163 lb 3.2 oz (74 kg), SpO2 100%. Wt Readings from Last 3 Encounters:  07/09/23 163 lb 3.2 oz (74 kg)  07/07/23 167 lb 12.8 oz (76.1 kg)  06/30/23 167 lb 8.8 oz (76 kg)   Body mass index is 29.85 kg/m. Performance status (ECOG): 1 - Symptomatic but completely ambulatory Physical Exam Constitutional:      Appearance: Normal appearance. She is not ill-appearing.  HENT:     Mouth/Throat:     Mouth: Mucous membranes are moist.     Pharynx: Oropharynx is clear. No oropharyngeal exudate or posterior oropharyngeal erythema.  Cardiovascular:     Rate and Rhythm: Normal rate and  regular rhythm.     Heart sounds: No murmur heard.    No friction rub. No gallop.  Pulmonary:     Effort: Pulmonary effort is normal. No respiratory distress.     Breath sounds: Decreased breath sounds present. No wheezing, rhonchi or rales.  Abdominal:     General: Bowel sounds are normal. There is no distension.     Palpations: Abdomen is soft. There is no mass.     Tenderness: There is no abdominal tenderness.  Musculoskeletal:        General: No swelling.     Right lower leg: No edema.     Left lower leg: No edema.  Lymphadenopathy:     Cervical: No cervical adenopathy.     Upper Body:     Right upper body: No supraclavicular or axillary adenopathy.     Left upper body: No supraclavicular or axillary adenopathy.     Lower Body: No right inguinal adenopathy. No left inguinal adenopathy.  Skin:    General: Skin is warm.     Coloration: Skin is not jaundiced.     Findings: No lesion or rash.  Neurological:     General: No focal deficit present.     Mental Status: She is alert and oriented to person, place, and time. Mental status is at baseline.  Psychiatric:        Mood  and Affect: Mood normal.        Behavior: Behavior normal.        Thought Content: Thought content normal.     PATHOLOGY:  His left lung biopsy in December 2024 revealed the following: A. LUNG, LEFT MASS, BIOPSY:  - Small cell carcinoma  LABS:      Latest Ref Rng & Units 07/09/2023    2:33 PM 06/23/2023    6:42 PM 12/07/2014    9:56 AM  CBC  WBC 4.0 - 10.5 K/uL 11.0  7.7  8.3   Hemoglobin 12.0 - 15.0 g/dL 16.1  09.6  04.5   Hematocrit 36.0 - 46.0 % 47.3  45.5  44.1   Platelets 150 - 400 K/uL 199  187  184       Latest Ref Rng & Units 07/09/2023    2:33 PM 06/23/2023    6:42 PM 12/07/2014    9:56 AM  CMP  Glucose 70 - 99 mg/dL 91  409  84   BUN 8 - 23 mg/dL 11  11  9    Creatinine 0.44 - 1.00 mg/dL 8.11  9.14  7.82   Sodium 135 - 145 mmol/L 136  131  138   Potassium 3.5 - 5.1 mmol/L 4.4  3.6  4.7   Chloride 98 - 111 mmol/L 100  97  102   CO2 22 - 32 mmol/L 25  27  26    Calcium 8.9 - 10.3 mg/dL 9.6  9.3  9.4   Total Protein 6.5 - 8.1 g/dL 7.3  7.4  7.2   Total Bilirubin <1.2 mg/dL 0.3  0.5  0.6   Alkaline Phos 38 - 126 U/L 108  90  90   AST 15 - 41 U/L 24  18  18    ALT 0 - 44 U/L 29  23  21       STUDIES:  CT Angio Chest/Abd/Pel for Dissection W and/or Wo Contrast Result Date: 06/23/2023 CLINICAL DATA:  Shortness of breath possible lung mass EXAM: CT ANGIOGRAPHY CHEST, ABDOMEN AND PELVIS TECHNIQUE: Non-contrast CT of the chest was initially  obtained. Multidetector CT imaging through the chest, abdomen and pelvis was performed using the standard protocol during bolus administration of intravenous contrast. Multiplanar reconstructed images and MIPs were obtained and reviewed to evaluate the vascular anatomy. RADIATION DOSE REDUCTION: This exam was performed according to the departmental dose-optimization program which includes automated exposure control, adjustment of the mA and/or kV according to patient size and/or use of iterative reconstruction technique. CONTRAST:   OMNIPAQUE IOHEXOL 350 MG/ML SOLN COMPARISON:  Chest x-ray 06/23/2023 FINDINGS: CTA CHEST FINDINGS Cardiovascular: Non contrasted images of the chest demonstrates no acute intramural hematoma. Moderate aortic atherosclerosis. No aneurysm or dissection is seen. Normal cardiac size. No pericardial effusion. Narrowing and in case mint of left pulmonary arterial vessels by soft tissue mass. Mediastinum/Nodes: Patent trachea. No thyroid nodule. Enlarged precarinal lymph node measuring 18 mm. Small right hilar nodes measuring up to 9 mm. Multiple enlarged prevascular nodes measuring up to 13 mm. Left paratracheal nodes measuring 14 mm. Large left hilar/suprahilar mass measuring approximately 6.8 x 6.2 by 7.4 cm and concerning for carcinoma. Lungs/Pleura: Emphysema. No pleural effusion or pneumothorax. Irregular left apical density measuring 24 x 17 mm on series 16, image 42, suspicious for satellite mass. Musculoskeletal: No acute or suspicious osseous abnormality Review of the MIP images confirms the above findings. CTA ABDOMEN AND PELVIS FINDINGS VASCULAR Aorta: Normal caliber aorta without aneurysm, dissection, vasculitis or significant stenosis. Moderate atherosclerosis Celiac: Patent without evidence of aneurysm, dissection, vasculitis or significant stenosis. Left gastric artery appears to take origin from the aorta directly above the celiac trunk. SMA: Mild stenosis of the proximal SMA slightly distal to the origin. Widely patent distally. Renals: Both renal arteries are patent without evidence of aneurysm, dissection, vasculitis, fibromuscular dysplasia or significant stenosis. IMA: Patent without evidence of aneurysm, dissection, vasculitis or significant stenosis. Inflow: Patent without evidence of aneurysm, dissection, vasculitis or significant stenosis. Moderate atherosclerosis. Veins: Poorly assessed Review of the MIP images confirms the above findings. NON-VASCULAR Hepatobiliary: Cholecystectomy. Hepatic  steatosis. No biliary dilatation Pancreas: Unremarkable. No pancreatic ductal dilatation or surrounding inflammatory changes. Spleen: Normal in size without focal abnormality. Adrenals/Urinary Tract: Kidneys show no hydronephrosis. 12 mm indeterminate right adrenal nodule. Multiple left adrenal nodules, posterior lesion measures mm, anterior lesion measures 2.6 cm and demonstrates fat density value consistent with adenoma. Kidneys show no hydronephrosis. Cyst in the left kidney for which no imaging follow-up is recommended. The bladder is normal Stomach/Bowel: The stomach is nonenlarged. No dilated small bowel. No acute bowel wall thickening. Diverticular disease of the colon. Negative appendix. Lymphatic: No suspicious lymph nodes. Reproductive: Uterus unremarkable.  No adnexal mass Other: Negative for pelvic effusion or free air. Benign-appearing smooth circumscribed partially calcified nodule in the central pelvis measuring 15 mm with central fat density. Musculoskeletal: No acute or suspicious osseous abnormality. Advanced degenerative changes of the lower lumbar spine Review of the MIP images confirms the above findings. IMPRESSION: 1. Negative for aortic dissection or aneurysm. Moderate atherosclerosis without significant stenotic lesion within the abdomen or pelvis. 2. Large left hilar/suprahilar mass measuring up to 7.4 cm, concerning for carcinoma. There is narrowing and encasement of left pulmonary arterial vessels by soft tissue mass. There is a 24 mm irregular left apical density suspicious for a satellite mass. Multiple enlarged mediastinal lymph nodes concerning for metastatic disease. Multi disciplinary thoracic consultation is recommended. 3. Emphysema. 4. Hepatic steatosis. 5. Diverticular disease of the colon without acute inflammatory process. 6. 12 mm indeterminate right adrenal nodule. This may be assessed with nonemergent  adrenal CT. Multiple left adrenal nodules, largest measuring 2.6 cm and  most likely representing adenoma. Aortic Atherosclerosis (ICD10-I70.0) and Emphysema (ICD10-J43.9). Electronically Signed   By: Jasmine Pang M.D.   On: 06/23/2023 21:21   DG Chest Port 1 View Result Date: 06/23/2023 CLINICAL DATA:  Shortness of breath EXAM: PORTABLE CHEST 1 VIEW COMPARISON:  X-ray 12/10/2012 FINDINGS: No pneumothorax or effusion. No edema. Normal cardiopericardial silhouette. There is masslike enlargement seen along the left hilum and mediastinum. This has worrisome for potential malignancy versus vascular lesion and recommend follow up CT angiogram/postcontrast chest CT. Overlapping cardiac leads. Slight basilar atelectasis. IMPRESSION: Widened mediastinum with masslike area along the left side obscuring the aortic knob in the lung hilum. This has a differential including a mass versus a vascular lesion recommend CT angiogram of the chest/postcontrast CT of the chest to further delineate. Electronically Signed   By: Karen Kays M.D.   On: 06/23/2023 18:38     ASSESSMENT & PLAN:  A 62 y.o. female who I was asked to consult upon for newly diagnosed small cell lung cancer.  In clinic today, I went over all of her CT scan images with her, for which she could see the large left hilar mass.  When I am concerned about is that the adrenal lesions radiology is calling adenomas may ultimately be metastatic sites of disease.  For completeness, I will have this patient undergo a PET scan to rule out the possibility of adrenal gland metastasis, as well as occult disease metastasis elsewhere.  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  All of the scans will be used to formulate her next course of action as it pertains to her small cell lung cancer management.  The patient understands all the plans discussed today and is in agreement with them.  I do appreciate Norm Salt, PA for his new  consult.   Shaneice Barsanti Kirby Funk, MD

## 2023-07-09 ENCOUNTER — Other Ambulatory Visit: Payer: Self-pay

## 2023-07-09 ENCOUNTER — Encounter: Payer: Self-pay | Admitting: Oncology

## 2023-07-09 ENCOUNTER — Inpatient Hospital Stay: Payer: Medicaid Other | Attending: Oncology | Admitting: Oncology

## 2023-07-09 ENCOUNTER — Other Ambulatory Visit: Payer: Self-pay | Admitting: Oncology

## 2023-07-09 ENCOUNTER — Inpatient Hospital Stay: Payer: Medicaid Other

## 2023-07-09 VITALS — BP 157/83 | HR 66 | Temp 98.1°F | Resp 16 | Ht 62.0 in | Wt 163.2 lb

## 2023-07-09 DIAGNOSIS — C3492 Malignant neoplasm of unspecified part of left bronchus or lung: Secondary | ICD-10-CM | POA: Diagnosis present

## 2023-07-09 DIAGNOSIS — R918 Other nonspecific abnormal finding of lung field: Secondary | ICD-10-CM

## 2023-07-09 DIAGNOSIS — C3412 Malignant neoplasm of upper lobe, left bronchus or lung: Secondary | ICD-10-CM

## 2023-07-09 DIAGNOSIS — Z860101 Personal history of adenomatous and serrated colon polyps: Secondary | ICD-10-CM | POA: Insufficient documentation

## 2023-07-09 DIAGNOSIS — K5900 Constipation, unspecified: Secondary | ICD-10-CM | POA: Insufficient documentation

## 2023-07-09 DIAGNOSIS — K625 Hemorrhage of anus and rectum: Secondary | ICD-10-CM | POA: Insufficient documentation

## 2023-07-09 DIAGNOSIS — K6289 Other specified diseases of anus and rectum: Secondary | ICD-10-CM | POA: Insufficient documentation

## 2023-07-09 DIAGNOSIS — K5730 Diverticulosis of large intestine without perforation or abscess without bleeding: Secondary | ICD-10-CM | POA: Insufficient documentation

## 2023-07-09 DIAGNOSIS — Z87891 Personal history of nicotine dependence: Secondary | ICD-10-CM | POA: Insufficient documentation

## 2023-07-09 DIAGNOSIS — C342 Malignant neoplasm of middle lobe, bronchus or lung: Secondary | ICD-10-CM

## 2023-07-09 LAB — CMP (CANCER CENTER ONLY)
ALT: 29 U/L (ref 0–44)
AST: 24 U/L (ref 15–41)
Albumin: 4.3 g/dL (ref 3.5–5.0)
Alkaline Phosphatase: 108 U/L (ref 38–126)
Anion gap: 11 (ref 5–15)
BUN: 11 mg/dL (ref 8–23)
CO2: 25 mmol/L (ref 22–32)
Calcium: 9.6 mg/dL (ref 8.9–10.3)
Chloride: 100 mmol/L (ref 98–111)
Creatinine: 0.8 mg/dL (ref 0.44–1.00)
GFR, Estimated: >60 mL/min (ref >60)
Glucose, Bld: 91 mg/dL (ref 70–99)
Potassium: 4.4 mmol/L (ref 3.5–5.1)
Sodium: 136 mmol/L (ref 135–145)
Total Bilirubin: 0.3 mg/dL (ref <1.2)
Total Protein: 7.3 g/dL (ref 6.5–8.1)

## 2023-07-09 LAB — CBC WITH DIFFERENTIAL (CANCER CENTER ONLY)
Abs Immature Granulocytes: 0.06 10*3/uL (ref 0.00–0.07)
Basophils Absolute: 0.1 10*3/uL (ref 0.0–0.1)
Basophils Relative: 1 %
Eosinophils Absolute: 0.2 10*3/uL (ref 0.0–0.5)
Eosinophils Relative: 2 %
HCT: 47.3 % — ABNORMAL HIGH (ref 36.0–46.0)
Hemoglobin: 15.6 g/dL — ABNORMAL HIGH (ref 12.0–15.0)
Immature Granulocytes: 1 %
Lymphocytes Relative: 13 %
Lymphs Abs: 1.4 10*3/uL (ref 0.7–4.0)
MCH: 29.8 pg (ref 26.0–34.0)
MCHC: 33 g/dL (ref 30.0–36.0)
MCV: 90.3 fL (ref 80.0–100.0)
Monocytes Absolute: 0.6 10*3/uL (ref 0.1–1.0)
Monocytes Relative: 5 %
Neutro Abs: 8.8 10*3/uL — ABNORMAL HIGH (ref 1.7–7.7)
Neutrophils Relative %: 78 %
Platelet Count: 199 10*3/uL (ref 150–400)
RBC: 5.24 MIL/uL — ABNORMAL HIGH (ref 3.87–5.11)
RDW: 13.2 % (ref 11.5–15.5)
WBC Count: 11 10*3/uL — ABNORMAL HIGH (ref 4.0–10.5)
nRBC: 0 % (ref 0.0–0.2)
nRBC: 0 /100{WBCs}

## 2023-07-09 NOTE — Telephone Encounter (Signed)
Pharmacy Patient Advocate Encounter  Received notification from [facility] that Prior Authorization for Trelegy has been DENIED.  Full denial letter will be uploaded to the media tab. See denial reason below.   PA #/Case ID/Reference #: [employer] reviewed your TRELEGY ELLIPTA 100-62.5-25 request for the above-identified  member, and it is denied for the following reason: because we did not see certain details about  your use and treatment. We see that this request is for a drug called Trelegy Ellipta inhaler for  your use (chronic obstructive pulmonary disease). We may consider approval of this drug after  a trial of certain other drugs first (a trial and failure of two formulary preferred drugs, such as  Advair Diskus, Advair HFA inhaler, Dulera inhaler, Symbicort inhaler). We did not see records  that you tried and did not respond well to two of these drugs first or that you cannot use them  for certain reasons (such as a drug-drug interaction or adverse drug experience). We based  this decision on your health plan's prior authorization criteria named Preferred Drug List.

## 2023-07-10 ENCOUNTER — Encounter (HOSPITAL_COMMUNITY): Payer: Self-pay

## 2023-07-10 ENCOUNTER — Inpatient Hospital Stay: Payer: Medicaid Other

## 2023-07-10 NOTE — Telephone Encounter (Signed)
Dana Roth- the Trelegy was denied Per pharm team:  We may consider approval of this drug after  a trial of certain other drugs first (a trial and failure of two formulary preferred drugs, such as  Advair Diskus, Advair HFA inhaler, Dulera inhaler, Symbicort inhaler).

## 2023-07-10 NOTE — Progress Notes (Signed)
The proposed treatment discussed in conference is for discussion purpose only and is not a binding recommendation.  The patients have not been physically examined, or presented with their treatment options.  Therefore, final treatment plans cannot be decided.  

## 2023-07-10 NOTE — Progress Notes (Signed)
[facility] Clinical Social Work  Initial Assessment   Dana Roth is a 62 y.o. year old female contacted by phone. Clinical Social Work was referred by nurse for assessment of psychosocial needs.   SDOH (Social Determinants of Health) assessments performed: No   SDOH Screenings   Food Insecurity: Food Insecurity Present (07/09/2023)  Housing: Low Risk  (07/09/2023)  Transportation Needs: No Transportation Needs (07/09/2023)  Utilities: Not At Risk (07/09/2023)  Depression (PHQ2-9): Low Risk  (07/09/2023)  Tobacco Use: High Risk (07/07/2023)     Distress Screen completed: No     No data to display            Family/Social Information:  Housing Arrangement: patient lives with her friend Dana Roth . Family members/support persons in your life? Family Transportation concerns: yes, Paying for transportation.  Employment: Disabled .  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 yes Concerns about diagnosis and/or treatment: How I will pay for the services I need Current coping skills/ strengths: Ability for insight  and Supportive family/friends     SUMMARY: Current SDOH Barriers:  Financial constraints related to transportation,medical bills  Clinical Social Work Clinical Goal(s):  Explore community resource options for unmet needs related to:  Transportation  Interventions: Discussed common feeling and emotions when being diagnosed with cancer, and the importance of support during treatment Informed patient of the support team roles and support services at [facility] Provided CSW contact information and encouraged patient to call with any questions or concerns Provided patient with information about [facility]   Follow Up Plan:  Patient will contact CSW with any support or resource needs Patient verbalizes understanding of plan: Yes  Marguerita Merles, LCSW Clinical Social Worker [facility]

## 2023-07-20 ENCOUNTER — Encounter (HOSPITAL_COMMUNITY): Payer: Self-pay

## 2023-07-21 ENCOUNTER — Telehealth: Payer: Self-pay | Admitting: Oncology

## 2023-07-21 DIAGNOSIS — C3412 Malignant neoplasm of upper lobe, left bronchus or lung: Secondary | ICD-10-CM | POA: Insufficient documentation

## 2023-07-21 NOTE — Telephone Encounter (Signed)
07/21/23 Spoke with patient and confirmed all upcoming scans and DV.

## 2023-07-23 ENCOUNTER — Other Ambulatory Visit (HOSPITAL_COMMUNITY): Payer: Self-pay

## 2023-07-23 ENCOUNTER — Telehealth: Payer: Self-pay | Admitting: Acute Care

## 2023-07-23 NOTE — Telephone Encounter (Signed)
 Patient needs a refill of Trelegy. It was denied by her insurance. A form of prior auth can be sent to insurance if it explains that the previous inhalers were causing her to have anxiety attacks. Insurance company is [employer]. Call back number [phone]  Pharmacy: [facility] in [place]

## 2023-07-23 NOTE — Telephone Encounter (Signed)
 No notation found of alternatives causing such a reaction, previous encounter for denial states patient must try and fail two preferred options and patient has only tried one. Please clarify and addend patients chart if patient has contraindications to trying alternatives and or/any previous instances of side effects.

## 2023-07-30 ENCOUNTER — Telehealth: Payer: Self-pay

## 2023-07-30 ENCOUNTER — Other Ambulatory Visit (HOSPITAL_COMMUNITY): Payer: Self-pay

## 2023-07-30 ENCOUNTER — Other Ambulatory Visit: Payer: Self-pay

## 2023-07-30 NOTE — Telephone Encounter (Signed)
 See 12/18 encounter re: Trelegy denial.

## 2023-07-30 NOTE — Telephone Encounter (Signed)
 Pharmacy Patient Advocate Encounter   Received notification from Cover[employer]eds that prior authorization for Trelegy Ellipta  100-62.5-25MCG/ACT aerosol powder is required/requested.   Insurance verification completed.   The patient is insured through [facility] .   CANCELLED due to previous denial on file. See encounter from 12.18.24 for further information. Patient needs to try and fail TWO preferred alternatives.  Preferred alternatives are:  Advair Diskus, Advair HFA, Dulera, Symbicort  **   PA #/Case ID/Reference #: [id number]   **See note from previous prior authorization encounter stating patient has tried Symbicort , but I cannot find documentation of this. All I'm able to locate is information on Albuterol 

## 2023-08-01 NOTE — Telephone Encounter (Signed)
 Routing to Maralyn Sago for her to be able to review.

## 2023-08-02 NOTE — Progress Notes (Signed)
 [facility]  [address] [place],  [place]  [address] [phone]  Clinic Day: 08/03/2023  Referring physician: Rosalea [name], PA   HISTORY OF PRESENT ILLNESS:  The patient is a 63 y.o. female who I recently began seeing for small cell lung cancer.  She comes in today to go over her PET scan imaging to determine if she has any evidence of distant metastasis.  Since her last visit, the patient is having more problems with shortness of breath.  In addition to not being able to walk 20 feet before becoming short of breath, she also has difficulty breathing when lying flat.  When this occurs, she gets very anxious.  Of note, the patient has also been scheduled for brain MRI, but it will not be done until later this week.    PHYSICAL EXAM:  Blood pressure (!) 145/84, pulse (!) 111, temperature 98.8 F (37.1 C), temperature source Oral, resp. rate 20, height 5' 2 (1.575 m), weight 168 lb 9.6 oz (76.5 kg), SpO2 100%. Wt Readings from Last 3 Encounters:  08/03/23 168 lb 9.6 oz (76.5 kg)  07/09/23 163 lb 3.2 oz (74 kg)  07/07/23 167 lb 12.8 oz (76.1 kg)   Body mass index is 30.84 kg/m. Performance status (ECOG): 1 - Symptomatic but completely ambulatory Physical Exam Constitutional:      Appearance: Normal appearance. She is not ill-appearing.     Comments: An anxious appearing woman who is in a wheelchair  HENT:     Mouth/Throat:     Mouth: Mucous membranes are moist.     Pharynx: Oropharynx is clear. No oropharyngeal exudate or posterior oropharyngeal erythema.  Cardiovascular:     Rate and Rhythm: Normal rate and regular rhythm.     Heart sounds: No murmur heard.    No friction rub. No gallop.  Pulmonary:     Effort: Pulmonary effort is normal. No respiratory distress.     Breath sounds: Decreased breath sounds present. No wheezing, rhonchi or rales.  Abdominal:     General: Bowel sounds are normal. There is no distension.     Palpations:  Abdomen is soft. There is no mass.     Tenderness: There is no abdominal tenderness.  Musculoskeletal:        General: No swelling.     Right lower leg: No edema.     Left lower leg: No edema.  Lymphadenopathy:     Cervical: No cervical adenopathy.     Upper Body:     Right upper body: No supraclavicular or axillary adenopathy.     Left upper body: No supraclavicular or axillary adenopathy.     Lower Body: No right inguinal adenopathy. No left inguinal adenopathy.  Skin:    General: Skin is warm.     Coloration: Skin is not jaundiced.     Findings: No lesion or rash.  Neurological:     General: No focal deficit present.     Mental Status: She is alert and oriented to person, place, and time. Mental status is at baseline.  Psychiatric:        Mood and Affect: Mood normal.        Behavior: Behavior normal.        Thought Content: Thought content normal.   SCANS: Although her official PET scan results have not been read, the patient appears to have at least 3 metastatic lesions within her liver.  She also appears to have metastatic periportal lymphadenopathy.  PATHOLOGY:  His left lung biopsy in December 2024 revealed the following: A. LUNG, LEFT MASS, BIOPSY:  - Small cell carcinoma  LABS:      Latest Ref Rng & Units 07/09/2023    2:33 PM 06/23/2023    6:42 PM 12/07/2014    9:56 AM  CBC  WBC 4.0 - 10.5 K/uL 11.0  7.7  8.3   Hemoglobin 12.0 - 15.0 g/dL 84.3  84.8  84.9   Hematocrit 36.0 - 46.0 % 47.3  45.5  44.1   Platelets 150 - 400 K/uL 199  187  184       Latest Ref Rng & Units 07/09/2023    2:33 PM 06/23/2023    6:42 PM 12/07/2014    9:56 AM  CMP  Glucose 70 - 99 mg/dL 91  866  84   BUN 8 - 23 mg/dL 11  11  9    Creatinine 0.44 - 1.00 mg/dL 9.19  9.31  9.21   Sodium 135 - 145 mmol/L 136  131  138   Potassium 3.5 - 5.1 mmol/L 4.4  3.6  4.7   Chloride 98 - 111 mmol/L 100  97  102   CO2 22 - 32 mmol/L 25  27  26    Calcium 8.9 - 10.3 mg/dL 9.6  9.3  9.4   Total  Protein 6.5 - 8.1 g/dL 7.3  7.4  7.2   Total Bilirubin <1.2 mg/dL 0.3  0.5  0.6   Alkaline Phos 38 - 126 U/L 108  90  90   AST 15 - 41 U/L 24  18  18    ALT 0 - 44 U/L 29  23  21     ASSESSMENT & PLAN:  A 63 y.o. female who unfortunately appears to have extensive stage small cell lung cancer.  In clinic today, I went over her PET scan images with her, for which she could see that she has metastatic disease outside of her left hemothorax.  Based upon this, the patient understands she is ultimately dealing with incurable disease.  Moving forward, my goal will be to give her palliative therapy to keep her disease under control for as long as possible.  I will start this patient on combination chemo/immunotherapy, which will consist of carboplatin /etoposide /atezolizumab .  Her carboplatin  and atezolizumab  will be given on day 1; her etoposide  will be given on days 1-3.  She will receive 4 cycles of this regimen before CT scans are repeated.  If there is disease improvement  seen with these scans, the patient would then be maintained on maintenance atezolizumab .  The patient was made aware of the side effects which go along with this regimen, including nausea, fatigue, alopecia, cytopenias, severe shortness of breath, and diarrhea.  As she is becoming very symptomatic from her underlying small cell lung cancer, she will receive her first cycle of this combination therapy on Wednesday, January 15th.  Of note, her brain MRI is scheduled for Thursday, January 16th.  I will make her aware of these study results as soon as they become available.  Otherwise, I will see this patient back in 3 weeks before she heads into her second cycle of carboplatin /etoposide /atezolizumab .  Although despondent, the patient and her family understand all the plans discussed today and are in agreement with them.  Joven Mom DELENA Kerns, MD

## 2023-08-03 ENCOUNTER — Inpatient Hospital Stay: Payer: Medicaid Other | Attending: Oncology | Admitting: Oncology

## 2023-08-03 ENCOUNTER — Other Ambulatory Visit: Payer: Self-pay | Admitting: Oncology

## 2023-08-03 VITALS — BP 145/84 | HR 111 | Temp 98.8°F | Resp 20 | Ht 62.0 in | Wt 168.6 lb

## 2023-08-03 DIAGNOSIS — C3492 Malignant neoplasm of unspecified part of left bronchus or lung: Secondary | ICD-10-CM | POA: Insufficient documentation

## 2023-08-03 DIAGNOSIS — C3412 Malignant neoplasm of upper lobe, left bronchus or lung: Secondary | ICD-10-CM

## 2023-08-03 DIAGNOSIS — Z79620 Long term (current) use of immunosuppressive biologic: Secondary | ICD-10-CM | POA: Diagnosis not present

## 2023-08-03 DIAGNOSIS — Z5111 Encounter for antineoplastic chemotherapy: Secondary | ICD-10-CM | POA: Insufficient documentation

## 2023-08-03 DIAGNOSIS — Z5189 Encounter for other specified aftercare: Secondary | ICD-10-CM | POA: Insufficient documentation

## 2023-08-03 MED ORDER — LORAZEPAM 1 MG PO TABS: ORAL_TABLET | ORAL | 0 refills | Status: DC

## 2023-08-03 NOTE — Progress Notes (Signed)
 START ON PATHWAY REGIMEN - Small Cell Lung     Cycles 1 through 4, every 21 days:     Atezolizumab       Carboplatin       Etoposide     Cycles 5 and beyond, every 21 days:     Atezolizumab    **Always confirm dose/schedule in your pharmacy ordering system**  Patient Characteristics: Newly Diagnosed, Preoperative or Nonsurgical Candidate (Clinical Staging), First Line, Extensive Stage Therapeutic Status: Newly Diagnosed, Preoperative or Nonsurgical Candidate (Clinical Staging) AJCC T Category: cT4 AJCC N Category: cN3 AJCC M Category: cM1c1 AJCC 9 Stage Grouping: IVB Check here if patient was staged using an edition other than AJCC Staging 9th Edition: false Stage Classification: Extensive Intent of Therapy: Non-Curative / Palliative Intent, Discussed with Patient

## 2023-08-04 ENCOUNTER — Encounter: Payer: Self-pay | Admitting: Oncology

## 2023-08-04 ENCOUNTER — Other Ambulatory Visit: Payer: Self-pay

## 2023-08-04 ENCOUNTER — Telehealth: Payer: Self-pay | Admitting: Acute Care

## 2023-08-04 NOTE — Telephone Encounter (Signed)
 Spoke with patient. She does not need to speak with you. Only wanted to make you aware that she is out of the 14 day supply of Trelegy you gave her at her appt. States insurance is advising the office has no t responded to their PA request. Do we have samples we can leave out front for her? She said she will drive to [facility] and pick them up. Original message sent to triage.

## 2023-08-04 NOTE — Telephone Encounter (Signed)
 Patient needs a refill of Trelegy. If a prior Auth is sent to her insurance company they will proscribe it to her. She would also like a sample since she is completely out. She would like to speak with Lauraine NP about this as well. She can be reached at [phone]

## 2023-08-04 NOTE — Telephone Encounter (Signed)
 Can we leave 2 samples of Trelegy 100  at the front for patient to pick up? She is waiting on  insurance to approve. Thanks so much   I called and spoke with the pt and let her know that we have left her samples of trelegy at the front   She states that her insurance told her that the trelegy has been denied  Routing to pharm team to check on this and see if there are alternatives

## 2023-08-05 ENCOUNTER — Other Ambulatory Visit: Payer: Self-pay | Admitting: Oncology

## 2023-08-05 ENCOUNTER — Encounter: Payer: Self-pay | Admitting: Oncology

## 2023-08-05 ENCOUNTER — Other Ambulatory Visit: Payer: Self-pay

## 2023-08-05 ENCOUNTER — Inpatient Hospital Stay: Payer: Medicaid Other

## 2023-08-05 VITALS — BP 151/86 | HR 95 | Temp 98.0°F | Resp 20

## 2023-08-05 DIAGNOSIS — Z5111 Encounter for antineoplastic chemotherapy: Secondary | ICD-10-CM | POA: Diagnosis not present

## 2023-08-05 DIAGNOSIS — C3412 Malignant neoplasm of upper lobe, left bronchus or lung: Secondary | ICD-10-CM

## 2023-08-05 LAB — CBC WITH DIFFERENTIAL (CANCER CENTER ONLY)
Abs Immature Granulocytes: 0.04 10*3/uL (ref 0.00–0.07)
Basophils Absolute: 0 10*3/uL (ref 0.0–0.1)
Basophils Relative: 0 %
Eosinophils Absolute: 0.2 10*3/uL (ref 0.0–0.5)
Eosinophils Relative: 2 %
HCT: 44.2 % (ref 36.0–46.0)
Hemoglobin: 15.3 g/dL — ABNORMAL HIGH (ref 12.0–15.0)
Immature Granulocytes: 1 %
Lymphocytes Relative: 10 %
Lymphs Abs: 0.9 10*3/uL (ref 0.7–4.0)
MCH: 29.8 pg (ref 26.0–34.0)
MCHC: 34.6 g/dL (ref 30.0–36.0)
MCV: 86.2 fL (ref 80.0–100.0)
Monocytes Absolute: 0.5 10*3/uL (ref 0.1–1.0)
Monocytes Relative: 5 %
Neutro Abs: 7.3 10*3/uL (ref 1.7–7.7)
Neutrophils Relative %: 82 %
Platelet Count: 233 10*3/uL (ref 150–400)
RBC: 5.13 MIL/uL — ABNORMAL HIGH (ref 3.87–5.11)
RDW: 12.6 % (ref 11.5–15.5)
WBC Count: 8.8 10*3/uL (ref 4.0–10.5)
nRBC: 0 % (ref 0.0–0.2)
nRBC: 0 /100{WBCs}

## 2023-08-05 LAB — CMP (CANCER CENTER ONLY)
ALT: 18 U/L (ref 0–44)
AST: 28 U/L (ref 15–41)
Albumin: 4.5 g/dL (ref 3.5–5.0)
Alkaline Phosphatase: 123 U/L (ref 38–126)
Anion gap: 11 (ref 5–15)
BUN: 6 mg/dL — ABNORMAL LOW (ref 8–23)
CO2: 26 mmol/L (ref 22–32)
Calcium: 9.9 mg/dL (ref 8.9–10.3)
Chloride: 96 mmol/L — ABNORMAL LOW (ref 98–111)
Creatinine: 0.68 mg/dL (ref 0.44–1.00)
GFR, Estimated: >60 mL/min (ref >60)
Glucose, Bld: 92 mg/dL (ref 70–99)
Potassium: 4 mmol/L (ref 3.5–5.1)
Sodium: 133 mmol/L — ABNORMAL LOW (ref 135–145)
Total Bilirubin: 0.3 mg/dL (ref 0.0–1.2)
Total Protein: 7.4 g/dL (ref 6.5–8.1)

## 2023-08-05 LAB — TSH: TSH: 2.637 u[IU]/mL (ref 0.350–4.500)

## 2023-08-05 MED ORDER — PALONOSETRON HCL INJECTION 0.25 MG/5ML
0.2500 mg | Freq: Once | INTRAVENOUS | Status: AC
  Administered 2023-08-05: 0.25 mg via INTRAVENOUS
  Filled 2023-08-05: qty 5

## 2023-08-05 MED ORDER — HEPARIN SOD (PORK) LOCK FLUSH 100 UNIT/ML IV SOLN
500.0000 [IU] | Freq: Once | INTRAVENOUS | Status: AC | PRN
  Administered 2023-08-05: 500 [IU]

## 2023-08-05 MED ORDER — SODIUM CHLORIDE 0.9 % IV SOLN
571.0000 mg | Freq: Once | INTRAVENOUS | Status: AC
Start: 2023-08-05 — End: 2023-08-05
  Administered 2023-08-05: 570 mg via INTRAVENOUS
  Filled 2023-08-05: qty 57

## 2023-08-05 MED ORDER — SODIUM CHLORIDE 0.9 % IV SOLN
150.0000 mg | Freq: Once | INTRAVENOUS | Status: AC
  Administered 2023-08-05: 150 mg via INTRAVENOUS
  Filled 2023-08-05: qty 150

## 2023-08-05 MED ORDER — SODIUM CHLORIDE 0.9 % IV SOLN
98.0000 mg/m2 | Freq: Once | INTRAVENOUS | Status: AC
  Administered 2023-08-05: 180 mg via INTRAVENOUS
  Filled 2023-08-05: qty 9

## 2023-08-05 MED ORDER — SODIUM CHLORIDE 0.9 % IV SOLN: INTRAVENOUS | Status: DC

## 2023-08-05 MED ORDER — LORAZEPAM 2 MG/ML IJ SOLN
1.0000 mg | Freq: Once | INTRAMUSCULAR | Status: AC
  Administered 2023-08-05: 1 mg via INTRAVENOUS
  Filled 2023-08-05: qty 1

## 2023-08-05 MED ORDER — SODIUM CHLORIDE 0.9% FLUSH
10.0000 mL | INTRAVENOUS | Status: DC | PRN
  Administered 2023-08-05: 10 mL

## 2023-08-05 MED ORDER — ONDANSETRON HCL 8 MG PO TABS: 8.0000 mg | ORAL_TABLET | Freq: Three times a day (TID) | ORAL | 1 refills | Status: DC | PRN

## 2023-08-05 MED ORDER — PROCHLORPERAZINE MALEATE 10 MG PO TABS: 10.0000 mg | ORAL_TABLET | Freq: Four times a day (QID) | ORAL | 1 refills | Status: DC | PRN

## 2023-08-05 MED ORDER — DEXAMETHASONE SODIUM PHOSPHATE 10 MG/ML IJ SOLN
10.0000 mg | Freq: Once | INTRAMUSCULAR | Status: AC
  Administered 2023-08-05: 10 mg via INTRAVENOUS
  Filled 2023-08-05: qty 1

## 2023-08-05 MED ORDER — SODIUM CHLORIDE 0.9 % IV SOLN
1200.0000 mg | Freq: Once | INTRAVENOUS | Status: AC
  Administered 2023-08-05: 1200 mg via INTRAVENOUS
  Filled 2023-08-05: qty 20

## 2023-08-05 NOTE — Telephone Encounter (Signed)
 Please see previous encounters-this medication has previously been denied due to not trying the required alternatives.

## 2023-08-05 NOTE — Progress Notes (Signed)
..  Pharmacist Chemotherapy Monitoring - Initial Assessment    Anticipated start date: 08/05/23  The following has been reviewed per standard work regarding the patient's treatment regimen: The patient's diagnosis, treatment plan and drug doses, and organ/hematologic function Lab orders and baseline tests specific to treatment regimen  The treatment plan start date, drug sequencing, and pre-medications Prior authorization status  Patient's documented medication list, including drug-drug interaction screen and prescriptions for anti-emetics and supportive care specific to the treatment regimen The drug concentrations, fluid compatibility, administration routes, and timing of the medications to be used The patient's access for treatment and lifetime cumulative dose history, if applicable  The patient's medication allergies and previous infusion related reactions, if applicable   Changes made to treatment plan:  N/A  Follow up needed:  N/A   Dana Roth, RPH, 08/05/2023  10:14 AM

## 2023-08-05 NOTE — Patient Instructions (Signed)
 Carboplatin  Injection What is this medication? CARBOPLATIN  (KAR boe pla tin) treats some types of cancer. It works by slowing down the growth of cancer cells. This medicine may be used for other purposes; ask your health care provider or pharmacist if you have questions. COMMON BRAND NAME(S): Paraplatin  What should I tell my care team before I take this medication? They need to know if you have any of these conditions: Blood disorders Hearing problems Kidney disease Recent or ongoing radiation therapy An unusual or allergic reaction to carboplatin , cisplatin, other medications, foods, dyes, or preservatives Pregnant or trying to get pregnant Breast-feeding How should I use this medication? This medication is injected into a vein. It is given by your care team in a hospital or clinic setting. Talk to your care team about the use of this medication in children. Special care may be needed. Overdosage: If you think you have taken too much of this medicine contact a poison control center or emergency room at once. NOTE: This medicine is only for you. Do not share this medicine with others. What if I miss a dose? Keep appointments for follow-up doses. It is important not to miss your dose. Call your care team if you are unable to keep an appointment. What may interact with this medication? Medications for seizures Some antibiotics, such as amikacin, gentamicin, neomycin, streptomycin, tobramycin Vaccines This list may not describe all possible interactions. Give your health care provider a list of all the medicines, herbs, non-prescription drugs, or dietary supplements you use. Also tell them if you smoke, drink alcohol, or use illegal drugs. Some items may interact with your medicine. What should I watch for while using this medication? Your condition will be monitored carefully while you are receiving this medication. You may need blood work while taking this medication. This medication may  make you feel generally unwell. This is not uncommon, as chemotherapy can affect healthy cells as well as cancer cells. Report any side effects. Continue your course of treatment even though you feel ill unless your care team tells you to stop. In some cases, you may be given additional medications to help with side effects. Follow all directions for their use. This medication may increase your risk of getting an infection. Call your care team for advice if you get a fever, chills, sore throat, or other symptoms of a cold or flu. Do not treat yourself. Try to avoid being around people who are sick. Avoid taking medications that contain aspirin , acetaminophen , ibuprofen, naproxen , or ketoprofen unless instructed by your care team. These medications may hide a fever. Be careful brushing or flossing your teeth or using a toothpick because you may get an infection or bleed more easily. If you have any dental work done, tell your dentist you are receiving this medication. Talk to your care team if you wish to become pregnant or think you might be pregnant. This medication can cause serious birth defects. Talk to your care team about effective forms of contraception. Do not breast-feed while taking this medication. What side effects may I notice from receiving this medication? Side effects that you should report to your care team as soon as possible: Allergic reactions--skin rash, itching, hives, swelling of the face, lips, tongue, or throat Infection--fever, chills, cough, sore throat, wounds that don't heal, pain or trouble when passing urine, general feeling of discomfort or being unwell Low red blood cell level--unusual weakness or fatigue, dizziness, headache, trouble breathing Pain, tingling, or numbness in the hands or  feet, muscle weakness, change in vision, confusion or trouble speaking, loss of balance or coordination, trouble walking, seizures Unusual bruising or bleeding Side effects that usually  do not require medical attention (report to your care team if they continue or are bothersome): Hair loss Nausea Unusual weakness or fatigue Vomiting This list may not describe all possible side effects. Call your doctor for medical advice about side effects. You may report side effects to FDA at 1-800-FDA-1088. Where should I keep my medication? This medication is given in a hospital or clinic. It will not be stored at home. NOTE: This sheet is a summary. It may not cover all possible information. If you have questions about this medicine, talk to your doctor, pharmacist, or health care provider.  2024 Elsevier/Gold Standard (2021-10-29 00:00:00) Etoposide  Injection What is this medication? ETOPOSIDE  (e toe POE side) treats some types of cancer. It works by slowing down the growth of cancer cells. This medicine may be used for other purposes; ask your health care provider or pharmacist if you have questions. COMMON BRAND NAME(S): Etopophos, Toposar , VePesid  What should I tell my care team before I take this medication? They need to know if you have any of these conditions: Infection Kidney disease Liver disease Low blood counts, such as low white cell, platelet, red cell counts An unusual or allergic reaction to etoposide , other medications, foods, dyes, or preservatives If you or your partner are pregnant or trying to get pregnant Breastfeeding How should I use this medication? This medication is injected into a vein. It is given by your care team in a hospital or clinic setting. Talk to your care team about the use of this medication in children. Special care may be needed. Overdosage: If you think you have taken too much of this medicine contact a poison control center or emergency room at once. NOTE: This medicine is only for you. Do not share this medicine with others. What if I miss a dose? Keep appointments for follow-up doses. It is important not to miss your dose. Call your  care team if you are unable to keep an appointment. What may interact with this medication? Warfarin This list may not describe all possible interactions. Give your health care provider a list of all the medicines, herbs, non-prescription drugs, or dietary supplements you use. Also tell them if you smoke, drink alcohol, or use illegal drugs. Some items may interact with your medicine. What should I watch for while using this medication? Your condition will be monitored carefully while you are receiving this medication. This medication may make you feel generally unwell. This is not uncommon as chemotherapy can affect healthy cells as well as cancer cells. Report any side effects. Continue your course of treatment even though you feel ill unless your care team tells you to stop. This medication can cause serious side effects. To reduce the risk, your care team may give you other medications to take before receiving this one. Be sure to follow the directions from your care team. This medication may increase your risk of getting an infection. Call your care team for advice if you get a fever, chills, sore throat, or other symptoms of a cold or flu. Do not treat yourself. Try to avoid being around people who are sick. This medication may increase your risk to bruise or bleed. Call your care team if you notice any unusual bleeding. Talk to your care team about your risk of cancer. You may be more at risk for  certain types of cancers if you take this medication. Talk to your care team if you may be pregnant. Serious birth defects can occur if you take this medication during pregnancy and for 6 months after the last dose. You will need a negative pregnancy test before starting this medication. Contraception is recommended while taking this medication and for 6 months after the last dose. Your care team can help you find the option that works for you. If your partner can get pregnant, use a condom during sex  while taking this medication and for 4 months after the last dose. Do not breastfeed while taking this medication. This medication may cause infertility. Talk to your care team if you are concerned about your fertility. What side effects may I notice from receiving this medication? Side effects that you should report to your care team as soon as possible: Allergic reactions--skin rash, itching, hives, swelling of the face, lips, tongue, or throat Infection--fever, chills, cough, sore throat, wounds that don't heal, pain or trouble when passing urine, general feeling of discomfort or being unwell Low red blood cell level--unusual weakness or fatigue, dizziness, headache, trouble breathing Unusual bruising or bleeding Side effects that usually do not require medical attention (report to your care team if they continue or are bothersome): Diarrhea Fatigue Hair loss Loss of appetite Nausea Vomiting This list may not describe all possible side effects. Call your doctor for medical advice about side effects. You may report side effects to FDA at 1-800-FDA-1088. Where should I keep my medication? This medication is given in a hospital or clinic. It will not be stored at home. NOTE: This sheet is a summary. It may not cover all possible information. If you have questions about this medicine, talk to your doctor, pharmacist, or health care provider.  2024 Elsevier/Gold Standard (2021-11-28 00:00:00) Atezolizumab  Injection What is this medication? ATEZOLIZUMAB  (a te zoe LIZ ue mab) treats some types of cancer. It works by helping your immune system slow or stop the spread of cancer cells. It is a monoclonal antibody. This medicine may be used for other purposes; ask your health care provider or pharmacist if you have questions. COMMON BRAND NAME(S): Tecentriq  What should I tell my care team before I take this medication? They need to know if you have any of these conditions: Allogeneic stem cell  transplant (uses someone else's stem cells) Autoimmune diseases, such as Crohn disease, ulcerative colitis, lupus History of chest radiation Nervous system problems, such as Guillain-Barre syndrome, myasthenia gravis Organ transplant An unusual or allergic reaction to atezolizumab , other medications, foods, dyes, or preservatives Pregnant or trying to get pregnant Breast-feeding How should I use this medication? This medication is injected into a vein. It is given by your care team in a hospital or clinic setting. A special MedGuide will be given to you before each treatment. Be sure to read this information carefully each time. Talk to your care team about the use of this medication in children. While it may be prescribed for children as young as 2 years for selected conditions, precautions do apply. Overdosage: If you think you have taken too much of this medicine contact a poison control center or emergency room at once. NOTE: This medicine is only for you. Do not share this medicine with others. What if I miss a dose? Keep appointments for follow-up doses. It is important not to miss your dose. Call your care team if you are unable to keep an appointment. What may interact with  this medication? Interactions have not been studied. This list may not describe all possible interactions. Give your health care provider a list of all the medicines, herbs, non-prescription drugs, or dietary supplements you use. Also tell them if you smoke, drink alcohol, or use illegal drugs. Some items may interact with your medicine. What should I watch for while using this medication? Your condition will be monitored carefully while you are receiving this medication. You may need blood work while taking this medication. This medication may cause serious skin reactions. They can happen weeks to months after starting the medication. Contact your care team right away if you notice fevers or flu-like symptoms with a  rash. The rash may be red or purple and then turn into blisters or peeling of the skin. You may also notice a red rash with swelling of the face, lips, or lymph nodes in your neck or under your arms. Tell your care team right away if you have any change in your eyesight. Talk to your care team if you may be pregnant. Serious birth defects can occur if you take this medication during pregnancy and for 5 months after the last dose. You will need a negative pregnancy test before starting this medication. Contraception is recommended while taking this medication and for 5 months after the last dose. Your care team can help you find the option that works for you. Do not breastfeed while taking this medication and for at least 5 months after the last dose. What side effects may I notice from receiving this medication? Side effects that you should report to your doctor or health care professional as soon as possible: Allergic reactions--skin rash, itching, hives, swelling of the face, lips, tongue, or throat Dry cough, shortness of breath or trouble breathing Eye pain, redness, irritation, or discharge with blurry or decreased vision Heart muscle inflammation--unusual weakness or fatigue, shortness of breath, chest pain, fast or irregular heartbeat, dizziness, swelling of the ankles, feet, or hands Hormone gland problems--headache, sensitivity to light, unusual weakness or fatigue, dizziness, fast or irregular heartbeat, increased sensitivity to cold or heat, excessive sweating, constipation, hair loss, increased thirst or amount of urine, tremors or shaking, irritability Infusion reactions--chest pain, shortness of breath or trouble breathing, feeling faint or lightheaded Kidney injury (glomerulonephritis)--decrease in the amount of urine, red or dark brown urine, foamy or bubbly urine, swelling of the ankles, hands, or feet Liver injury--right upper belly pain, loss of appetite, nausea, light-colored stool,  dark yellow or brown urine, yellowing skin or eyes, unusual weakness or fatigue Pain, tingling, or numbness in the hands or feet, muscle weakness, change in vision, confusion or trouble speaking, loss of balance or coordination, trouble walking, seizures Rash, fever, and swollen lymph nodes Redness, blistering, peeling, or loosening of the skin, including inside the mouth Sudden or severe stomach pain, bloody diarrhea, fever, nausea, vomiting Side effects that usually do not require medical attention (report to your doctor or health care professional if they continue or are bothersome): Bone, joint, or muscle pain Diarrhea Fatigue Loss of appetite Nausea Skin rash This list may not describe all possible side effects. Call your doctor for medical advice about side effects. You may report side effects to FDA at 1-800-FDA-1088. Where should I keep my medication? This medication is given in a hospital or clinic. It will not be stored at home. NOTE: This sheet is a summary. It may not cover all possible information. If you have questions about this medicine, talk to your doctor,  pharmacist, or health care provider.  2024 Elsevier/Gold Standard (2021-11-22 00:00:00)

## 2023-08-06 ENCOUNTER — Ambulatory Visit: Payer: Medicaid Other

## 2023-08-06 ENCOUNTER — Inpatient Hospital Stay: Payer: Medicaid Other

## 2023-08-06 ENCOUNTER — Telehealth: Payer: Self-pay

## 2023-08-06 ENCOUNTER — Encounter: Payer: Self-pay | Admitting: Oncology

## 2023-08-06 VITALS — BP 150/86 | HR 98 | Resp 20 | Ht 62.0 in | Wt 169.0 lb

## 2023-08-06 DIAGNOSIS — C3412 Malignant neoplasm of upper lobe, left bronchus or lung: Secondary | ICD-10-CM

## 2023-08-06 DIAGNOSIS — Z5111 Encounter for antineoplastic chemotherapy: Secondary | ICD-10-CM | POA: Diagnosis not present

## 2023-08-06 LAB — T4: T4, Total: 7.7 ug/dL (ref 4.5–12.0)

## 2023-08-06 MED ORDER — SODIUM CHLORIDE 0.9 % IV SOLN
98.0000 mg/m2 | Freq: Once | INTRAVENOUS | Status: AC
  Administered 2023-08-06: 180 mg via INTRAVENOUS
  Filled 2023-08-06: qty 9

## 2023-08-06 MED ORDER — SODIUM CHLORIDE 0.9% FLUSH: 10.0000 mL | INTRAVENOUS | Status: DC | PRN

## 2023-08-06 MED ORDER — HEPARIN SOD (PORK) LOCK FLUSH 100 UNIT/ML IV SOLN: 500.0000 [IU] | Freq: Once | INTRAVENOUS | Status: DC | PRN

## 2023-08-06 MED ORDER — DEXAMETHASONE SODIUM PHOSPHATE 10 MG/ML IJ SOLN
10.0000 mg | Freq: Once | INTRAMUSCULAR | Status: AC
  Administered 2023-08-06: 10 mg via INTRAVENOUS
  Filled 2023-08-06: qty 1

## 2023-08-06 MED ORDER — SODIUM CHLORIDE 0.9 % IV SOLN: INTRAVENOUS | Status: DC

## 2023-08-06 NOTE — Progress Notes (Signed)
[facility] CSW Progress Note  Visual merchandiser met with patient to assess pyschosocial needs. Patient expressed challenges and expressed limited support at home. Patient was tearful when discussing state of health and desire for more support. CSW placed referral to Patient Financial Specialist for the [employer]. Patient declined a need for transportation assistance at this time. CSW placed a referral for CAP through [facility]   Marguerita Merles, LCSW Clinical Social Worker [facility]

## 2023-08-06 NOTE — Telephone Encounter (Signed)
Maralyn Sago- please choose from the following covered alternatives for Trelegy:  Advair Diskus, Advair HFA, Dulera, Symbicort

## 2023-08-06 NOTE — Telephone Encounter (Signed)
We need to document failure , so maybe they will cover. Bevelyn Ngo, NP to Me  Lbpu Triage [facility]     08/06/23 10:07 AM  These are not equivalents . They are double therapy, not triple. Send in whatever her insurance covers.  Sarah, I do not see where she has tried any other inhalers other than trelegy  In order for them to be able to try for PA for tregy is if she tries and fails the advair disc, advair hfa, symbicort or dulera   Please let us know which one you prefer and what strength, thanks!

## 2023-08-06 NOTE — Patient Instructions (Signed)
Etoposide Injection What is this medication? ETOPOSIDE (e toe POE side) treats some types of cancer. It works by slowing down the growth of cancer cells. This medicine may be used for other purposes; ask your health care provider or pharmacist if you have questions. COMMON BRAND NAME(S): Etopophos, Toposar, VePesid What should I tell my care team before I take this medication? They need to know if you have any of these conditions: Infection Kidney disease Liver disease Low blood counts, such as low white cell, platelet, red cell counts An unusual or allergic reaction to etoposide, other medications, foods, dyes, or preservatives If you or your partner are pregnant or trying to get pregnant Breastfeeding How should I use this medication? This medication is injected into a vein. It is given by your care team in a hospital or clinic setting. Talk to your care team about the use of this medication in children. Special care may be needed. Overdosage: If you think you have taken too much of this medicine contact a poison control center or emergency room at once. NOTE: This medicine is only for you. Do not share this medicine with others. What if I miss a dose? Keep appointments for follow-up doses. It is important not to miss your dose. Call your care team if you are unable to keep an appointment. What may interact with this medication? Warfarin This list may not describe all possible interactions. Give your health care provider a list of all the medicines, herbs, non-prescription drugs, or dietary supplements you use. Also tell them if you smoke, drink alcohol, or use illegal drugs. Some items may interact with your medicine. What should I watch for while using this medication? Your condition will be monitored carefully while you are receiving this medication. This medication may make you feel generally unwell. This is not uncommon as chemotherapy can affect healthy cells as well as cancer  cells. Report any side effects. Continue your course of treatment even though you feel ill unless your care team tells you to stop. This medication can cause serious side effects. To reduce the risk, your care team may give you other medications to take before receiving this one. Be sure to follow the directions from your care team. This medication may increase your risk of getting an infection. Call your care team for advice if you get a fever, chills, sore throat, or other symptoms of a cold or flu. Do not treat yourself. Try to avoid being around people who are sick. This medication may increase your risk to bruise or bleed. Call your care team if you notice any unusual bleeding. Talk to your care team about your risk of cancer. You may be more at risk for certain types of cancers if you take this medication. Talk to your care team if you may be pregnant. Serious birth defects can occur if you take this medication during pregnancy and for 6 months after the last dose. You will need a negative pregnancy test before starting this medication. Contraception is recommended while taking this medication and for 6 months after the last dose. Your care team can help you find the option that works for you. If your partner can get pregnant, use a condom during sex while taking this medication and for 4 months after the last dose. Do not breastfeed while taking this medication. This medication may cause infertility. Talk to your care team if you are concerned about your fertility. What side effects may I notice from receiving this medication?  Side effects that you should report to your care team as soon as possible: Allergic reactions--skin rash, itching, hives, swelling of the face, lips, tongue, or throat Infection--fever, chills, cough, sore throat, wounds that don't heal, pain or trouble when passing urine, general feeling of discomfort or being unwell Low red blood cell level--unusual weakness or fatigue,  dizziness, headache, trouble breathing Unusual bruising or bleeding Side effects that usually do not require medical attention (report to your care team if they continue or are bothersome): Diarrhea Fatigue Hair loss Loss of appetite Nausea Vomiting This list may not describe all possible side effects. Call your doctor for medical advice about side effects. You may report side effects to FDA at 1-800-FDA-1088. Where should I keep my medication? This medication is given in a hospital or clinic. It will not be stored at home. NOTE: This sheet is a summary. It may not cover all possible information. If you have questions about this medicine, talk to your doctor, pharmacist, or health care provider.  2024 Elsevier/Gold Standard (2021-11-28 00:00:00)

## 2023-08-06 NOTE — Telephone Encounter (Addendum)
I spoke with pt to see how she is tolerating treatment so far. She had just left the infusion room, & was leaving the parking lot. Pt denies N/V, diarrhea, increased cough (intermittent non productive), chest pain, metallic taste, nosebleeds, skin rash, itching, dry mouth, and mouth sores. She does report constipation, SOB on exertion, intermittent headaches, and bone/joint/muscle pain as baseline. She doesn't think she has had any fevers, but doesn't have a thermometer. She mentioned she is sleeping better. We discussed hair loss and things to be aware of once her hair is gone. I told her if her head/scalp become sore, she could try satin pillow cases & also wash with baby shampoo (as is gentle). I stressed the importance of keeping her scalp/head warm during the cold,- wearing toboggans,or hats. Also, if she is outside for any length of time, to protect her head from the sun. I told her when her hair returns it maybe different than before. She had been told this. I recommended (per physician guidelines), that she should start taking senna-S 2 tabs po BID for constipation. I reminded her to take Claritin (loratadine) 10mg  po daily for 5 days, starting the day she receives the WBC injection. Pt aware to call us if she develops temp of 100.4 or higher, day or night.

## 2023-08-07 ENCOUNTER — Encounter: Payer: Self-pay | Admitting: Oncology

## 2023-08-07 ENCOUNTER — Ambulatory Visit: Payer: Medicaid Other

## 2023-08-07 ENCOUNTER — Inpatient Hospital Stay: Payer: Medicaid Other

## 2023-08-07 VITALS — BP 131/86 | HR 98 | Temp 98.0°F | Resp 18 | Ht 62.0 in | Wt 170.0 lb

## 2023-08-07 DIAGNOSIS — Z5111 Encounter for antineoplastic chemotherapy: Secondary | ICD-10-CM | POA: Diagnosis not present

## 2023-08-07 DIAGNOSIS — C3412 Malignant neoplasm of upper lobe, left bronchus or lung: Secondary | ICD-10-CM

## 2023-08-07 MED ORDER — SODIUM CHLORIDE 0.9 % IV SOLN
98.0000 mg/m2 | Freq: Once | INTRAVENOUS | Status: AC
  Administered 2023-08-07: 180 mg via INTRAVENOUS
  Filled 2023-08-07: qty 9

## 2023-08-07 MED ORDER — SODIUM CHLORIDE 0.9 % IV SOLN: INTRAVENOUS | Status: DC

## 2023-08-07 MED ORDER — DEXAMETHASONE SODIUM PHOSPHATE 10 MG/ML IJ SOLN
10.0000 mg | Freq: Once | INTRAMUSCULAR | Status: AC
  Administered 2023-08-07: 10 mg via INTRAVENOUS
  Filled 2023-08-07: qty 1

## 2023-08-07 NOTE — Patient Instructions (Signed)
Etoposide Injection What is this medication? ETOPOSIDE (e toe POE side) treats some types of cancer. It works by slowing down the growth of cancer cells. This medicine may be used for other purposes; ask your health care provider or pharmacist if you have questions. COMMON BRAND NAME(S): Etopophos, Toposar, VePesid What should I tell my care team before I take this medication? They need to know if you have any of these conditions: Infection Kidney disease Liver disease Low blood counts, such as low white cell, platelet, red cell counts An unusual or allergic reaction to etoposide, other medications, foods, dyes, or preservatives If you or your partner are pregnant or trying to get pregnant Breastfeeding How should I use this medication? This medication is injected into a vein. It is given by your care team in a hospital or clinic setting. Talk to your care team about the use of this medication in children. Special care may be needed. Overdosage: If you think you have taken too much of this medicine contact a poison control center or emergency room at once. NOTE: This medicine is only for you. Do not share this medicine with others. What if I miss a dose? Keep appointments for follow-up doses. It is important not to miss your dose. Call your care team if you are unable to keep an appointment. What may interact with this medication? Warfarin This list may not describe all possible interactions. Give your health care provider a list of all the medicines, herbs, non-prescription drugs, or dietary supplements you use. Also tell them if you smoke, drink alcohol, or use illegal drugs. Some items may interact with your medicine. What should I watch for while using this medication? Your condition will be monitored carefully while you are receiving this medication. This medication may make you feel generally unwell. This is not uncommon as chemotherapy can affect healthy cells as well as cancer  cells. Report any side effects. Continue your course of treatment even though you feel ill unless your care team tells you to stop. This medication can cause serious side effects. To reduce the risk, your care team may give you other medications to take before receiving this one. Be sure to follow the directions from your care team. This medication may increase your risk of getting an infection. Call your care team for advice if you get a fever, chills, sore throat, or other symptoms of a cold or flu. Do not treat yourself. Try to avoid being around people who are sick. This medication may increase your risk to bruise or bleed. Call your care team if you notice any unusual bleeding. Talk to your care team about your risk of cancer. You may be more at risk for certain types of cancers if you take this medication. Talk to your care team if you may be pregnant. Serious birth defects can occur if you take this medication during pregnancy and for 6 months after the last dose. You will need a negative pregnancy test before starting this medication. Contraception is recommended while taking this medication and for 6 months after the last dose. Your care team can help you find the option that works for you. If your partner can get pregnant, use a condom during sex while taking this medication and for 4 months after the last dose. Do not breastfeed while taking this medication. This medication may cause infertility. Talk to your care team if you are concerned about your fertility. What side effects may I notice from receiving this medication?  Side effects that you should report to your care team as soon as possible: Allergic reactions--skin rash, itching, hives, swelling of the face, lips, tongue, or throat Infection--fever, chills, cough, sore throat, wounds that don't heal, pain or trouble when passing urine, general feeling of discomfort or being unwell Low red blood cell level--unusual weakness or fatigue,  dizziness, headache, trouble breathing Unusual bruising or bleeding Side effects that usually do not require medical attention (report to your care team if they continue or are bothersome): Diarrhea Fatigue Hair loss Loss of appetite Nausea Vomiting This list may not describe all possible side effects. Call your doctor for medical advice about side effects. You may report side effects to FDA at 1-800-FDA-1088. Where should I keep my medication? This medication is given in a hospital or clinic. It will not be stored at home. NOTE: This sheet is a summary. It may not cover all possible information. If you have questions about this medicine, talk to your doctor, pharmacist, or health care provider.  2024 Elsevier/Gold Standard (2021-11-28 00:00:00)

## 2023-08-10 ENCOUNTER — Inpatient Hospital Stay: Payer: Medicaid Other

## 2023-08-10 ENCOUNTER — Telehealth: Payer: Self-pay

## 2023-08-10 VITALS — BP 116/81 | HR 99 | Temp 98.0°F | Resp 18

## 2023-08-10 DIAGNOSIS — C3412 Malignant neoplasm of upper lobe, left bronchus or lung: Secondary | ICD-10-CM

## 2023-08-10 DIAGNOSIS — Z5111 Encounter for antineoplastic chemotherapy: Secondary | ICD-10-CM | POA: Diagnosis not present

## 2023-08-10 MED ORDER — PEGFILGRASTIM-CBQV 6 MG/0.6ML ~~LOC~~ SOSY
6.0000 mg | PREFILLED_SYRINGE | Freq: Once | SUBCUTANEOUS | Status: AC
  Administered 2023-08-10: 6 mg via SUBCUTANEOUS
  Filled 2023-08-10: qty 0.6

## 2023-08-10 NOTE — Patient Instructions (Signed)
Pegfilgrastim Injection Quel est ce mdicament ? Le PEGFILGRASTIM diminue le risque d'infection chez les personnes recevant une chimiothrapie. Il agit en aidant votre organisme  produire davantage de globules blancs, ce qui le protge contre les infections. Il peut galement tre utilis pour traiter les [employer] t exposes  de fortes de doses de rayonnement. Ce mdicament peut tre utilis pour d'autres indications ; adressez-vous  votre mdecin ou  votre pharmacien si vous [employer] questions. NOM(S) DE MARQUE COURANT(S) : Fulphila, Fylnetra, Neulasta, Nyvepria, Stimufend, UDENYCA, UDENYCA ONBODY, Ziextenzo Que dois-je dire  mon fournisseur de [employer] de prendre ce mdicament ? Ils ont besoin de savoir si vous prsentez l'une quelconque des affections ou situations suivantes : Maladie rnale Allergie au latex Radiothrapie en cours Drpanocytose Ractions cutanes aux adhsifs acryliques (injecteur corporel seulement) Raction inhabituelle ou allergique au pegfilgrastim ou au filgrastim Raction inhabituelle ou allergique  d'autres mdicaments,  des aliments,  des colorants ou  des conservateurs [place] ou projet de grossesse Allaitement Comment dois-je utiliser ce mdicament ? Ce mdicament est destin  tre inject sous la peau. Si vous recevez ce mdicament pour [employer]  domicile, on vous expliquera comment le prparer et administrer la seringue prremplie ou comment utiliser l'injecteur corporel. Suivez les instructions d'utilisation dtailles figurant dans la notice. Utilisez-le exactement [employer] instructions. Avertissez immdiatement votre quipe de soins si vous suspectez que l'injecteur corporel pourrait ne pas avoir fonctionn comme prvu ou si vous pensez que l'injecteur corporel n'a pas administr la dose ou seulement une dose [place]. Il est important que vous mettiez vos aiguilles et [employer] un rcipient spcial pour objets pointus  et tranchants. Ne les [employer] Inc. Si vous ne disposez pas d'un rcipient pour [employer] Life Insurance pointus et tranchants, Clinical cytogeneticist votre quipe de soins pour [employer] un. Pour toute information relative  l'utilisation de ce mdicament [employer], adressez-vous  votre quipe de [place]. Bien que ce mdicament puisse tre prescrit pour certaines affections, des prcautions s'imposent. Surdosage : si vous pensez avoir pris ce mdicament en excs, contactez immdiatement un centre [employer].<br />REMARQUE : ce mdicament vous est uniquement destin. Ne partagez pas ce Arts administrator. Que faire si je saute une dose ? Il est important de ne pas sauter de dose. Contactez votre quipe de soins si vous omettez votre dose. Si vous manquez une dose en raison d'un dysfonctionnement ou d'une fuite de Printmaker corporel, une nouvelle dose doit tre administre ds que possible, manuellement,  l'aide d'une seule seringue premplie. Quelles sont les interactions possibles avec ce mdicament ? Les interactions n'ont pas t tudies. Cette liste peut ne pas dcrire toutes les interactions mdicamenteuses possibles. Donnez  votre fournisseur de [employer] de tous les mdicaments, plantes mdicinales, mdicaments en vente libre ou complments alimentaires que vous prenez. Dites-lui aussi si vous fumez, buvez de l'alcool ou consommez des drogues illicites. Certaines substances peuvent interagir Writer. Que dois-je IT consultant par ce mdicament ? Vous serez troitement surveill(e) Warehouse manager par [employer]. Vous devrez [employer] de sang rgulires Art therapist par [employer]. Discutez avec votre quipe de soins de votre risque de cancer. Vous pouvez prsenter un risque plus lev de dvelopper certains types de cancer si vous prenez ce mdicament. Si vous devez Electronics engineer IRM, un  scanner ou toute autre procdure, informez votre quipe de soins que vous utilisez ce mdicament (injecteur corporel seulement). Quels effets  secondaires puis-je remarquer en prenant ce mdicament ? Effets secondaires que vous devez signaler  votre quipe de soins ds que possible : Ractions allergiques : ruption cutane, dmangeaisons, urticaire, gonflement du visage, des lvres, de la langue ou de la gorge Syndrome de fuite capillaire : Insurance underwriter ou musculaire, faiblesse ou fatigue inhabituelle, sensation d'vanouissement ou d'tourdissement, diminution de la quantit d'urine, gonflement des [place], des mains ou des pieds, difficults  respirer Taux lev de globules blancs : fivre, fatigue, difficults  respirer, sueurs nocturnes, modification de la vision, perte de poids Inflammation de l'aorte : fivre, fatigue, douleur au dos, dans la poitrine ou  l'estomac, mal de tte svre Lsion des reins (glomrulonphrite) : diminution de la quantit d'urine, urine rouge ou marron fonc, urine mousseuse ou effervescente, gonflement des [place], des mains ou des pieds Difficults  respirer ou essoufflements Lsions de la rate : Location manager la partie suprieure gauche de l'estomac ou  l'paule Saignements ou ecchymoses (bleus) inhabituels Effets secondaires ne ncessitant gnralement pas un avis mdical (signalez-les  votre quipe de soins s'ils persistent ou sont gnants) : Douleur osseuse Douleur dans les mains ou les pieds Cette liste peut ne pas dcrire tous les effets secondaires possibles. Appelez votre mdecin pour [employer] sujet des [employer]. Vous pouvez signaler les effets secondaires  la FDA, au [phone]. O dois-je conserver mon mdicament ? Conserver hors de Building surveyor. Si vous prenez ce mdicament chez vous, vous recevrez des Limited Brands dont il doit tre Multimedia programmer. Jeter tout mdicament inutilis aprs la date de  premption figurant sur l'tiquette ou sur Art therapist. REMARQUE : cette notice est un rsum. Elle peut ne pas contenir toutes les informations possibles. Si vous avez des questions  propos de ce mdicament, Patent attorney mdecin, votre pharmacien ou votre fournisseur de [place].  2024 Elsevier/Gold Standard (2022-08-12 00:00:00)

## 2023-08-10 NOTE — Telephone Encounter (Signed)
PET scan impressions: 1. 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 Hypermetabolic dominant AP window nodal adenopathy through out chest, upper abdomen and pelvis, as well as hepatic and osseous metastasis. 2. Prominent right vocal cord hypermetabolism, without clear CT correlate. Finding is quite asymmetric for physiologic activity. Consider CT neck w/contrast for further evaluation, as malignancy cannot be excluded.  3. Right internal jugular port a cath is coiled in the right internal jugular vein, terminating in the low internal jugular vein, new from chest radiograph 07/17/2023.  4. Bilateral adrenal adenomas  5. Aortic Atherosclerosis. Above findings given to Dr Melvyn Neth, Gwynneth Albright, and Kendal Hymen for Dr Melvyn Neth @ 1130.

## 2023-08-11 ENCOUNTER — Telehealth: Payer: Self-pay

## 2023-08-11 ENCOUNTER — Other Ambulatory Visit: Payer: Self-pay

## 2023-08-11 DIAGNOSIS — R300 Dysuria: Secondary | ICD-10-CM

## 2023-08-11 DIAGNOSIS — Z5111 Encounter for antineoplastic chemotherapy: Secondary | ICD-10-CM | POA: Diagnosis not present

## 2023-08-11 LAB — URINALYSIS, COMPLETE (UACMP) WITH MICROSCOPIC
Bilirubin Urine: NEGATIVE
Glucose, UA: NEGATIVE mg/dL
Hgb urine dipstick: NEGATIVE
Ketones, ur: NEGATIVE mg/dL
Nitrite: NEGATIVE
Protein, ur: NEGATIVE mg/dL
Specific Gravity, Urine: 1.01 (ref 1.005–1.030)
WBC, UA: >50 WBC/hpf (ref 0–5)
pH: 6.5 (ref 5.0–8.0)

## 2023-08-11 NOTE — Telephone Encounter (Signed)
Spoke with Archie Patten and Lininger's office to advise that pt needs to be seen due to port incorrectly positioned.  She will contact patient for consult.

## 2023-08-13 LAB — URINE CULTURE: Culture: >=100000 — AB

## 2023-08-17 ENCOUNTER — Encounter: Payer: Self-pay | Admitting: Oncology

## 2023-08-19 ENCOUNTER — Telehealth: Payer: Self-pay

## 2023-08-19 NOTE — Telephone Encounter (Signed)
Spoke to patient regarding enrollment in the [employer]. The patient has confirmed they will provide the required documentation on 08/26/2023 for approval.

## 2023-08-24 ENCOUNTER — Encounter: Payer: Self-pay | Admitting: Oncology

## 2023-08-25 NOTE — Progress Notes (Signed)
 [facility]  [address] [place],  [place]  [address] [phone]  Clinic Day: 08/26/2023  Referring physician: Rosalea [name], PA   HISTORY OF PRESENT ILLNESS:  The patient is a 63 y.o. female with extensive stage small cell lung cancer, which includes spread of disease to her liver.  She comes in today to be evaluated before heading into her 2nd cycle of carboplatin /etoposide /atezolizumab .  The patient claims to have tolerated her 1st cycle of this regimen fairly well.  She has had a moderate degree of nausea to where she requests an additional antiemetic for prn use.  She does claim she breathes better.  Overall, she is very emotionally distraught due to her diagnosis, prognosis, and hair loss.  She is scheduled to see social work today to discuss her emotional lability.    PHYSICAL EXAM:  Blood pressure 139/83, pulse 78, temperature 98.2 F (36.8 C), temperature source Oral, resp. rate 16, height 5' 2 (1.575 m), weight 166 lb 9.6 oz (75.6 kg), SpO2 98%. Wt Readings from Last 3 Encounters:  08/26/23 166 lb 9.6 oz (75.6 kg)  08/07/23 170 lb (77.1 kg)  08/06/23 169 lb (76.7 kg)   Body mass index is 30.47 kg/m. Performance status (ECOG): 1 - Symptomatic but completely ambulatory Physical Exam Constitutional:      Appearance: Normal appearance. She is not ill-appearing.     Comments: An anxious appearing woman who is ambulating with a cane   HENT:     Mouth/Throat:     Mouth: Mucous membranes are moist.     Pharynx: Oropharynx is clear. No oropharyngeal exudate or posterior oropharyngeal erythema.  Cardiovascular:     Rate and Rhythm: Normal rate and regular rhythm.     Heart sounds: No murmur heard.    No friction rub. No gallop.  Pulmonary:     Effort: Pulmonary effort is normal. No respiratory distress.     Breath sounds: No decreased breath sounds, wheezing, rhonchi or rales.  Abdominal:     General: Bowel sounds are normal. There  is no distension.     Palpations: Abdomen is soft. There is no mass.     Tenderness: There is no abdominal tenderness.  Musculoskeletal:        General: No swelling.     Right lower leg: No edema.     Left lower leg: No edema.  Lymphadenopathy:     Cervical: No cervical adenopathy.     Upper Body:     Right upper body: No supraclavicular or axillary adenopathy.     Left upper body: No supraclavicular or axillary adenopathy.     Lower Body: No right inguinal adenopathy. No left inguinal adenopathy.  Skin:    General: Skin is warm.     Coloration: Skin is not jaundiced.     Findings: No lesion or rash.  Neurological:     General: No focal deficit present.     Mental Status: She is alert and oriented to person, place, and time. Mental status is at baseline.  Psychiatric:        Mood and Affect: Mood normal.        Behavior: Behavior normal.        Thought Content: Thought content normal.   SCANS: Her PET scans revealed the following: IMPRESSION: 1. Stage IV primary bronchogenic carcinoma, likely small cell lung cancer. Hypermetabolic dominant AP window nodal mass with adenopathy throughout the chest, upper abdomen and pelvis as well as hepatic  and osseous metastases. 2. Prominent right vocal cord hypermetabolism, without a clear CT correlate. Finding is quite asymmetric for physiologic activity. Consider CT neck with contrast in further evaluation, as malignancy cannot be excluded. 3. Right IJ Port-A-Cath is coiled in the right internal jugular vein, terminating in the low internal jugular vein, new from chest radiograph 07/17/2023. These results will be called to the ordering clinician or representative by the Radiologist Assistant, and communication documented in the PACS or [employer]. 4. Bilateral adrenal adenomas. 5. Aortic atherosclerosis (ICD10-I70.0).  PATHOLOGY:  His left lung biopsy in December 2024 revealed the following: A. LUNG, LEFT MASS, BIOPSY:  - Small  cell carcinoma  LABS:      Latest Ref Rng & Units 08/26/2023    8:39 AM 08/05/2023    9:14 AM 07/09/2023    2:33 PM  CBC  WBC 4.0 - 10.5 K/uL 8.1  8.8  11.0   Hemoglobin 12.0 - 15.0 g/dL 87.1  84.6  84.3   Hematocrit 36.0 - 46.0 % 37.9  44.2  47.3   Platelets 150 - 400 K/uL 281  233  199       Latest Ref Rng & Units 08/26/2023    8:39 AM 08/05/2023    9:14 AM 07/09/2023    2:33 PM  CMP  Glucose 70 - 99 mg/dL 73  92  91   BUN 8 - 23 mg/dL 10  6  11    Creatinine 0.44 - 1.00 mg/dL 9.33  9.31  9.19   Sodium 135 - 145 mmol/L 139  133  136   Potassium 3.5 - 5.1 mmol/L 4.3  4.0  4.4   Chloride 98 - 111 mmol/L 104  96  100   CO2 22 - 32 mmol/L 26  26  25    Calcium 8.9 - 10.3 mg/dL 9.6  9.9  9.6   Total Protein 6.5 - 8.1 g/dL 7.2  7.4  7.3   Total Bilirubin 0.0 - 1.2 mg/dL <9.7  0.3  0.3   Alkaline Phos 38 - 126 U/L 129  123  108   AST 15 - 41 U/L 14  28  24    ALT 0 - 44 U/L 12  18  29     ASSESSMENT & PLAN:  A 63 y.o. female with extensive stage small cell lung cancer, including spread of disease to her liver and bones.  She will proceed with her 2nd cycle of carboplatin /etoposide /atezolizumab  today.  Although she is emotionally distraught today, she clinically is doing much better after her 1st cycle of treatment.  I will provide her with ondansetron , which she can take with her compazine  for nausea.  I will prescribe her lortab to help with the bone pain she gets from her Neulasta  injections, which she receives after each cycle of treatment.  I will also prescribe her a wig for her alopecia.  I will also prescribe her albuterol  to help with her intermittent shortness of breath.  Of note, the patient was made aware that her brain MRI came back negative.  Although she is emotionally distraught, I reassured her that her treatments already appear to be working.  I will see this patient back in 3 weeks before she heads into her 3rd cycle of carboplatin /etoposide /atezolizumab .  The patient and her  family understand all the plans discussed today and are in agreement with them.  Farron Lafond DELENA Kerns, MD

## 2023-08-26 ENCOUNTER — Encounter: Payer: Self-pay | Admitting: Oncology

## 2023-08-26 ENCOUNTER — Inpatient Hospital Stay: Payer: Medicaid Other | Attending: Oncology | Admitting: Oncology

## 2023-08-26 ENCOUNTER — Inpatient Hospital Stay: Payer: Medicaid Other

## 2023-08-26 ENCOUNTER — Other Ambulatory Visit: Payer: Self-pay | Admitting: Oncology

## 2023-08-26 ENCOUNTER — Other Ambulatory Visit (HOSPITAL_BASED_OUTPATIENT_CLINIC_OR_DEPARTMENT_OTHER): Payer: Self-pay

## 2023-08-26 VITALS — BP 139/83 | HR 78 | Temp 98.2°F | Resp 16 | Ht 62.0 in | Wt 166.6 lb

## 2023-08-26 DIAGNOSIS — C787 Secondary malignant neoplasm of liver and intrahepatic bile duct: Secondary | ICD-10-CM | POA: Insufficient documentation

## 2023-08-26 DIAGNOSIS — C3412 Malignant neoplasm of upper lobe, left bronchus or lung: Secondary | ICD-10-CM

## 2023-08-26 DIAGNOSIS — Z5112 Encounter for antineoplastic immunotherapy: Secondary | ICD-10-CM | POA: Diagnosis present

## 2023-08-26 DIAGNOSIS — Z5189 Encounter for other specified aftercare: Secondary | ICD-10-CM | POA: Diagnosis not present

## 2023-08-26 DIAGNOSIS — C7951 Secondary malignant neoplasm of bone: Secondary | ICD-10-CM | POA: Insufficient documentation

## 2023-08-26 DIAGNOSIS — Z5111 Encounter for antineoplastic chemotherapy: Secondary | ICD-10-CM | POA: Diagnosis present

## 2023-08-26 DIAGNOSIS — C3492 Malignant neoplasm of unspecified part of left bronchus or lung: Secondary | ICD-10-CM | POA: Diagnosis present

## 2023-08-26 LAB — CBC WITH DIFFERENTIAL (CANCER CENTER ONLY)
Abs Immature Granulocytes: 0.1 10*3/uL — ABNORMAL HIGH (ref 0.00–0.07)
Basophils Absolute: 0.1 10*3/uL (ref 0.0–0.1)
Basophils Relative: 1 %
Eosinophils Absolute: 0 10*3/uL (ref 0.0–0.5)
Eosinophils Relative: 0 %
HCT: 37.9 % (ref 36.0–46.0)
Hemoglobin: 12.8 g/dL (ref 12.0–15.0)
Immature Granulocytes: 1 %
Lymphocytes Relative: 12 %
Lymphs Abs: 1 10*3/uL (ref 0.7–4.0)
MCH: 30 pg (ref 26.0–34.0)
MCHC: 33.8 g/dL (ref 30.0–36.0)
MCV: 89 fL (ref 80.0–100.0)
Monocytes Absolute: 0.4 10*3/uL (ref 0.1–1.0)
Monocytes Relative: 5 %
Neutro Abs: 6.5 10*3/uL (ref 1.7–7.7)
Neutrophils Relative %: 81 %
Platelet Count: 281 10*3/uL (ref 150–400)
RBC: 4.26 MIL/uL (ref 3.87–5.11)
RDW: 13.5 % (ref 11.5–15.5)
WBC Count: 8.1 10*3/uL (ref 4.0–10.5)
nRBC: 0 % (ref 0.0–0.2)
nRBC: 0 /100{WBCs}

## 2023-08-26 LAB — CMP (CANCER CENTER ONLY)
ALT: 12 U/L (ref 0–44)
AST: 14 U/L — ABNORMAL LOW (ref 15–41)
Albumin: 3.9 g/dL (ref 3.5–5.0)
Alkaline Phosphatase: 129 U/L — ABNORMAL HIGH (ref 38–126)
Anion gap: 9 (ref 5–15)
BUN: 10 mg/dL (ref 8–23)
CO2: 26 mmol/L (ref 22–32)
Calcium: 9.6 mg/dL (ref 8.9–10.3)
Chloride: 104 mmol/L (ref 98–111)
Creatinine: 0.66 mg/dL (ref 0.44–1.00)
GFR, Estimated: >60 mL/min (ref >60)
Glucose, Bld: 73 mg/dL (ref 70–99)
Potassium: 4.3 mmol/L (ref 3.5–5.1)
Sodium: 139 mmol/L (ref 135–145)
Total Bilirubin: <0.2 mg/dL (ref 0.0–1.2)
Total Protein: 7.2 g/dL (ref 6.5–8.1)

## 2023-08-26 MED ORDER — HYDROCODONE-ACETAMINOPHEN 7.5-325 MG PO TABS
1.0000 | ORAL_TABLET | Freq: Four times a day (QID) | ORAL | 0 refills | Status: DC | PRN
  Filled 2023-08-26 – 2023-09-03 (×2): qty 30, 8d supply, fill #0

## 2023-08-26 MED ORDER — HEPARIN SOD (PORK) LOCK FLUSH 100 UNIT/ML IV SOLN: 500.0000 [IU] | Freq: Once | INTRAVENOUS | Status: DC | PRN

## 2023-08-26 MED ORDER — SODIUM CHLORIDE 0.9 % IV SOLN
565.5000 mg | Freq: Once | INTRAVENOUS | Status: AC
  Administered 2023-08-26: 570 mg via INTRAVENOUS
  Filled 2023-08-26: qty 57

## 2023-08-26 MED ORDER — PALONOSETRON HCL INJECTION 0.25 MG/5ML
0.2500 mg | Freq: Once | INTRAVENOUS | Status: AC
  Administered 2023-08-26: 0.25 mg via INTRAVENOUS
  Filled 2023-08-26: qty 5

## 2023-08-26 MED ORDER — SODIUM CHLORIDE 0.9 % IV SOLN
98.0000 mg/m2 | Freq: Once | INTRAVENOUS | Status: AC
  Administered 2023-08-26: 180 mg via INTRAVENOUS
  Filled 2023-08-26: qty 9

## 2023-08-26 MED ORDER — SODIUM CHLORIDE 0.9 % IV SOLN
1200.0000 mg | Freq: Once | INTRAVENOUS | Status: AC
  Administered 2023-08-26: 1200 mg via INTRAVENOUS
  Filled 2023-08-26: qty 20

## 2023-08-26 MED ORDER — DEXAMETHASONE SODIUM PHOSPHATE 10 MG/ML IJ SOLN
10.0000 mg | Freq: Once | INTRAMUSCULAR | Status: AC
  Administered 2023-08-26: 10 mg via INTRAVENOUS
  Filled 2023-08-26: qty 1

## 2023-08-26 MED ORDER — ALBUTEROL SULFATE HFA 108 (90 BASE) MCG/ACT IN AERS
2.0000 | INHALATION_SPRAY | Freq: Four times a day (QID) | RESPIRATORY_TRACT | 2 refills | Status: DC | PRN
  Filled 2023-08-26: qty 6.7, 17d supply, fill #0

## 2023-08-26 MED ORDER — SODIUM CHLORIDE 0.9 % IV SOLN
150.0000 mg | Freq: Once | INTRAVENOUS | Status: AC
  Administered 2023-08-26: 150 mg via INTRAVENOUS
  Filled 2023-08-26: qty 150

## 2023-08-26 MED ORDER — ONDANSETRON 4 MG PO TBDP
4.0000 mg | ORAL_TABLET | Freq: Four times a day (QID) | ORAL | 3 refills | Status: DC | PRN
  Filled 2023-08-26: qty 30, 8d supply, fill #0

## 2023-08-26 MED ORDER — SODIUM CHLORIDE 0.9 % IV SOLN: INTRAVENOUS | Status: DC

## 2023-08-26 MED ORDER — SODIUM CHLORIDE 0.9% FLUSH: 10.0000 mL | INTRAVENOUS | Status: DC | PRN

## 2023-08-26 NOTE — Progress Notes (Signed)
PER VERBAL REPORT FROM DR Georgiana Shore, PAC OK TO USE. REPEAT CXR SHOWED TIP OF PAC IN CENTRAL LOCATION NO LONGER COILED IN JUGULAR VEIN.  PAC WILL BE REPLACED NEXT WEEK BY DR Georgiana Shore.  PAC FLUSHES EASILY WITH GOOD BLOOD RETURN

## 2023-08-26 NOTE — Patient Instructions (Signed)
 Atezolizumab  Injection What is this medication? ATEZOLIZUMAB  (a te zoe LIZ ue mab) treats some types of cancer. It works by helping your immune system slow or stop the spread of cancer cells. It is a monoclonal antibody. This medicine may be used for other purposes; ask your health care provider or pharmacist if you have questions. COMMON BRAND NAME(S): Tecentriq  What should I tell my care team before I take this medication? They need to know if you have any of these conditions: Allogeneic stem cell transplant (uses someone else's stem cells) Autoimmune diseases, such as Crohn disease, ulcerative colitis, lupus History of chest radiation Nervous system problems, such as Guillain-Barre syndrome, myasthenia gravis Organ transplant An unusual or allergic reaction to atezolizumab , other medications, foods, dyes, or preservatives Pregnant or trying to get pregnant Breast-feeding How should I use this medication? This medication is injected into a vein. It is given by your care team in a hospital or clinic setting. A special MedGuide will be given to you before each treatment. Be sure to read this information carefully each time. Talk to your care team about the use of this medication in children. While it may be prescribed for children as young as 2 years for selected conditions, precautions do apply. Overdosage: If you think you have taken too much of this medicine contact a poison control center or emergency room at once. NOTE: This medicine is only for you. Do not share this medicine with others. What if I miss a dose? Keep appointments for follow-up doses. It is important not to miss your dose. Call your care team if you are unable to keep an appointment. What may interact with this medication? Interactions have not been studied. This list may not describe all possible interactions. Give your health care provider a list of all the medicines, herbs, non-prescription drugs, or dietary  supplements you use. Also tell them if you smoke, drink alcohol , or use illegal drugs. Some items may interact with your medicine. What should I watch for while using this medication? Your condition will be monitored carefully while you are receiving this medication. You may need blood work done while you are taking this medication. This medication may cause serious skin reactions. They can happen weeks to months after starting the medication. Contact your care team right away if you notice fevers or flu-like symptoms with a rash. The rash may be red or purple and then turn into blisters or peeling of the skin. You may also notice a red rash with swelling of the face, lips, or lymph nodes in your neck or under your arms. Talk to your care team if you may be pregnant. Serious birth defects can occur if you take this medication during pregnancy and for 5 months after the last dose. You will need a negative pregnancy test before starting this medication. Contraception is recommended while taking this medication and for 5 months after the last dose. Your care team can help you find the option that works for you. Do not breastfeed while taking this medication and for 5 months after the last dose. This medication may cause infertility. Talk to your care team if you are concerned about your fertility. What side effects may I notice from receiving this medication? Side effects that you should report to your doctor or health care professional as soon as possible: Allergic reactions--skin rash, itching, hives, swelling of the face, lips, tongue, or throat Dry cough, shortness of breath or trouble breathing Eye pain, redness, irritation, or discharge  with blurry or decreased vision Heart muscle inflammation--unusual weakness or fatigue, shortness of breath, chest pain, fast or irregular heartbeat, dizziness, swelling of the ankles, feet, or hands Hormone gland problems--headache, sensitivity to light, unusual  weakness or fatigue, dizziness, fast or irregular heartbeat, increased sensitivity to cold or heat, excessive sweating, constipation, hair loss, increased thirst or amount of urine, tremors or shaking, irritability Infusion reactions--chest pain, shortness of breath or trouble breathing, feeling faint or lightheaded Kidney injury (glomerulonephritis)--decrease in the amount of urine, red or dark brown urine, foamy or bubbly urine, swelling of the ankles, hands, or feet Liver injury--right upper belly pain, loss of appetite, nausea, light-colored stool, dark yellow or brown urine, yellowing skin or eyes, unusual weakness or fatigue Pain, tingling, or numbness in the hands or feet, muscle weakness, change in vision, confusion or trouble speaking, loss of balance or coordination, trouble walking, seizures Rash, fever, and swollen lymph nodes Redness, blistering, peeling, or loosening of the skin, including inside the mouth Sudden or severe stomach pain, bloody diarrhea, fever, nausea, vomiting Side effects that usually do not require medical attention (report to your doctor or health care professional if they continue or are bothersome): Bone, joint, or muscle pain Diarrhea Fatigue Loss of appetite Nausea Skin rash This list may not describe all possible side effects. Call your doctor for medical advice about side effects. You may report side effects to FDA at 1-800-FDA-1088. Where should I keep my medication? This medication is given in a hospital or clinic. It will not be stored at home. NOTE: This sheet is a summary. It may not cover all possible information. If you have questions about this medicine, talk to your doctor, pharmacist, or health care provider.  2024 Elsevier/Gold Standard (2023-04-22 00:00:00)Carboplatin  Injection What is this medication? CARBOPLATIN  (KAR boe pla tin) treats some types of cancer. It works by slowing down the growth of cancer cells. This medicine may be used for  other purposes; ask your health care provider or pharmacist if you have questions. COMMON BRAND NAME(S): Paraplatin  What should I tell my care team before I take this medication? They need to know if you have any of these conditions: Blood disorders Hearing problems Kidney disease Recent or ongoing radiation therapy An unusual or allergic reaction to carboplatin , cisplatin, other medications, foods, dyes, or preservatives Pregnant or trying to get pregnant Breast-feeding How should I use this medication? This medication is injected into a vein. It is given by your care team in a hospital or clinic setting. Talk to your care team about the use of this medication in children. Special care may be needed. Overdosage: If you think you have taken too much of this medicine contact a poison control center or emergency room at once. NOTE: This medicine is only for you. Do not share this medicine with others. What if I miss a dose? Keep appointments for follow-up doses. It is important not to miss your dose. Call your care team if you are unable to keep an appointment. What may interact with this medication? Medications for seizures Some antibiotics, such as amikacin, gentamicin , neomycin, streptomycin, tobramycin Vaccines This list may not describe all possible interactions. Give your health care provider a list of all the medicines, herbs, non-prescription drugs, or dietary supplements you use. Also tell them if you smoke, drink alcohol , or use illegal drugs. Some items may interact with your medicine. What should I watch for while using this medication? Your condition will be monitored carefully while you are receiving this  medication. You may need blood work while taking this medication. This medication may make you feel generally unwell. This is not uncommon, as chemotherapy can affect healthy cells as well as cancer cells. Report any side effects. Continue your course of treatment even though  you feel ill unless your care team tells you to stop. In some cases, you may be given additional medications to help with side effects. Follow all directions for their use. This medication may increase your risk of getting an infection. Call your care team for advice if you get a fever, chills, sore throat, or other symptoms of a cold or flu. Do not treat yourself. Try to avoid being around people who are sick. Avoid taking medications that contain aspirin , acetaminophen , ibuprofen, naproxen, or ketoprofen unless instructed by your care team. These medications may hide a fever. Be careful brushing or flossing your teeth or using a toothpick because you may get an infection or bleed more easily. If you have any dental work done, tell your dentist you are receiving this medication. Talk to your care team if you wish to become pregnant or think you might be pregnant. This medication can cause serious birth defects. Talk to your care team about effective forms of contraception. Do not breast-feed while taking this medication. What side effects may I notice from receiving this medication? Side effects that you should report to your care team as soon as possible: Allergic reactions--skin rash, itching, hives, swelling of the face, lips, tongue, or throat Infection--fever, chills, cough, sore throat, wounds that don't heal, pain or trouble when passing urine, general feeling of discomfort or being unwell Low red blood cell level--unusual weakness or fatigue, dizziness, headache, trouble breathing Pain, tingling, or numbness in the hands or feet, muscle weakness, change in vision, confusion or trouble speaking, loss of balance or coordination, trouble walking, seizures Unusual bruising or bleeding Side effects that usually do not require medical attention (report to your care team if they continue or are bothersome): Hair loss Nausea Unusual weakness or fatigue Vomiting This list may not describe all  possible side effects. Call your doctor for medical advice about side effects. You may report side effects to FDA at 1-800-FDA-1088. Where should I keep my medication? This medication is given in a hospital or clinic. It will not be stored at home. NOTE: This sheet is a summary. It may not cover all possible information. If you have questions about this medicine, talk to your doctor, pharmacist, or health care provider.  2024 Elsevier/Gold Standard (2021-10-29 00:00:00)Etoposide  Injection What is this medication? ETOPOSIDE  (e toe POE side) treats some types of cancer. It works by slowing down the growth of cancer cells. This medicine may be used for other purposes; ask your health care provider or pharmacist if you have questions. COMMON BRAND NAME(S): Etopophos, Toposar , VePesid  What should I tell my care team before I take this medication? They need to know if you have any of these conditions: Infection Kidney disease Liver disease Low blood counts, such as low white cell, platelet, red cell counts An unusual or allergic reaction to etoposide , other medications, foods, dyes, or preservatives If you or your partner are pregnant or trying to get pregnant Breastfeeding How should I use this medication? This medication is injected into a vein. It is given by your care team in a hospital or clinic setting. Talk to your care team about the use of this medication in children. Special care may be needed. Overdosage: If you think you  have taken too much of this medicine contact a poison control center or emergency room at once. NOTE: This medicine is only for you. Do not share this medicine with others. What if I miss a dose? Keep appointments for follow-up doses. It is important not to miss your dose. Call your care team if you are unable to keep an appointment. What may interact with this medication? Warfarin This list may not describe all possible interactions. Give your health care provider  a list of all the medicines, herbs, non-prescription drugs, or dietary supplements you use. Also tell them if you smoke, drink alcohol , or use illegal drugs. Some items may interact with your medicine. What should I watch for while using this medication? Your condition will be monitored carefully while you are receiving this medication. This medication may make you feel generally unwell. This is not uncommon as chemotherapy can affect healthy cells as well as cancer cells. Report any side effects. Continue your course of treatment even though you feel ill unless your care team tells you to stop. This medication can cause serious side effects. To reduce the risk, your care team may give you other medications to take before receiving this one. Be sure to follow the directions from your care team. This medication may increase your risk of getting an infection. Call your care team for advice if you get a fever, chills, sore throat, or other symptoms of a cold or flu. Do not treat yourself. Try to avoid being around people who are sick. This medication may increase your risk to bruise or bleed. Call your care team if you notice any unusual bleeding. Talk to your care team about your risk of cancer. You may be more at risk for certain types of cancers if you take this medication. Talk to your care team if you may be pregnant. Serious birth defects can occur if you take this medication during pregnancy and for 6 months after the last dose. You will need a negative pregnancy test before starting this medication. Contraception is recommended while taking this medication and for 6 months after the last dose. Your care team can help you find the option that works for you. If your partner can get pregnant, use a condom during sex while taking this medication and for 4 months after the last dose. Do not breastfeed while taking this medication. This medication may cause infertility. Talk to your care team if you are  concerned about your fertility. What side effects may I notice from receiving this medication? Side effects that you should report to your care team as soon as possible: Allergic reactions--skin rash, itching, hives, swelling of the face, lips, tongue, or throat Infection--fever, chills, cough, sore throat, wounds that don't heal, pain or trouble when passing urine, general feeling of discomfort or being unwell Low red blood cell level--unusual weakness or fatigue, dizziness, headache, trouble breathing Unusual bruising or bleeding Side effects that usually do not require medical attention (report to your care team if they continue or are bothersome): Diarrhea Fatigue Hair loss Loss of appetite Nausea Vomiting This list may not describe all possible side effects. Call your doctor for medical advice about side effects. You may report side effects to FDA at 1-800-FDA-1088. Where should I keep my medication? This medication is given in a hospital or clinic. It will not be stored at home. NOTE: This sheet is a summary. It may not cover all possible information. If you have questions about this medicine, talk to  your doctor, pharmacist, or health care provider.  2024 Elsevier/Gold Standard (2021-11-28 00:00:00)

## 2023-08-27 ENCOUNTER — Inpatient Hospital Stay: Payer: Medicaid Other

## 2023-08-27 ENCOUNTER — Other Ambulatory Visit: Payer: Self-pay

## 2023-08-27 VITALS — BP 130/71 | HR 75 | Temp 98.0°F | Resp 18

## 2023-08-27 DIAGNOSIS — Z5112 Encounter for antineoplastic immunotherapy: Secondary | ICD-10-CM | POA: Diagnosis not present

## 2023-08-27 DIAGNOSIS — C3412 Malignant neoplasm of upper lobe, left bronchus or lung: Secondary | ICD-10-CM

## 2023-08-27 MED ORDER — SODIUM CHLORIDE 0.9 % IV SOLN: INTRAVENOUS | Status: DC

## 2023-08-27 MED ORDER — DEXAMETHASONE SODIUM PHOSPHATE 10 MG/ML IJ SOLN
10.0000 mg | Freq: Once | INTRAMUSCULAR | Status: AC
  Administered 2023-08-27: 10 mg via INTRAVENOUS
  Filled 2023-08-27: qty 1

## 2023-08-27 MED ORDER — HEPARIN SOD (PORK) LOCK FLUSH 100 UNIT/ML IV SOLN
500.0000 [IU] | Freq: Once | INTRAVENOUS | Status: AC | PRN
  Administered 2023-08-27: 500 [IU]

## 2023-08-27 MED ORDER — SODIUM CHLORIDE 0.9 % IV SOLN
98.0000 mg/m2 | Freq: Once | INTRAVENOUS | Status: AC
  Administered 2023-08-27: 180 mg via INTRAVENOUS
  Filled 2023-08-27: qty 9

## 2023-08-27 MED ORDER — SODIUM CHLORIDE 0.9% FLUSH
10.0000 mL | INTRAVENOUS | Status: DC | PRN
  Administered 2023-08-27: 10 mL

## 2023-08-27 NOTE — Progress Notes (Signed)
 [facility] CSW Progress Note  Visual Merchandiser met with patient during infusion. CSW submitted financial documents to Patient Financial Specialist. Patient was visibly distraught attributing the emotion to back to back cancer treatments, treatment induced alopecia, and day to day life. Patient has a provider that prescribes Compazine , but is looking a for more consistent medication provider to assist with additional medications due to the extent of her anxiety. Patient placed another CAP referral to [facility].   Lizbeth Sprague, LCSW Clinical Social Worker [facility].

## 2023-08-27 NOTE — Patient Instructions (Signed)
Etoposide Injection What is this medication? ETOPOSIDE (e toe POE side) treats some types of cancer. It works by slowing down the growth of cancer cells. This medicine may be used for other purposes; ask your health care provider or pharmacist if you have questions. COMMON BRAND NAME(S): Etopophos, Toposar, VePesid What should I tell my care team before I take this medication? They need to know if you have any of these conditions: Infection Kidney disease Liver disease Low blood counts, such as low white cell, platelet, red cell counts An unusual or allergic reaction to etoposide, other medications, foods, dyes, or preservatives If you or your partner are pregnant or trying to get pregnant Breastfeeding How should I use this medication? This medication is injected into a vein. It is given by your care team in a hospital or clinic setting. Talk to your care team about the use of this medication in children. Special care may be needed. Overdosage: If you think you have taken too much of this medicine contact a poison control center or emergency room at once. NOTE: This medicine is only for you. Do not share this medicine with others. What if I miss a dose? Keep appointments for follow-up doses. It is important not to miss your dose. Call your care team if you are unable to keep an appointment. What may interact with this medication? Warfarin This list may not describe all possible interactions. Give your health care provider a list of all the medicines, herbs, non-prescription drugs, or dietary supplements you use. Also tell them if you smoke, drink alcohol, or use illegal drugs. Some items may interact with your medicine. What should I watch for while using this medication? Your condition will be monitored carefully while you are receiving this medication. This medication may make you feel generally unwell. This is not uncommon as chemotherapy can affect healthy cells as well as cancer  cells. Report any side effects. Continue your course of treatment even though you feel ill unless your care team tells you to stop. This medication can cause serious side effects. To reduce the risk, your care team may give you other medications to take before receiving this one. Be sure to follow the directions from your care team. This medication may increase your risk of getting an infection. Call your care team for advice if you get a fever, chills, sore throat, or other symptoms of a cold or flu. Do not treat yourself. Try to avoid being around people who are sick. This medication may increase your risk to bruise or bleed. Call your care team if you notice any unusual bleeding. Talk to your care team about your risk of cancer. You may be more at risk for certain types of cancers if you take this medication. Talk to your care team if you may be pregnant. Serious birth defects can occur if you take this medication during pregnancy and for 6 months after the last dose. You will need a negative pregnancy test before starting this medication. Contraception is recommended while taking this medication and for 6 months after the last dose. Your care team can help you find the option that works for you. If your partner can get pregnant, use a condom during sex while taking this medication and for 4 months after the last dose. Do not breastfeed while taking this medication. This medication may cause infertility. Talk to your care team if you are concerned about your fertility. What side effects may I notice from receiving this medication?  Side effects that you should report to your care team as soon as possible: Allergic reactions--skin rash, itching, hives, swelling of the face, lips, tongue, or throat Infection--fever, chills, cough, sore throat, wounds that don't heal, pain or trouble when passing urine, general feeling of discomfort or being unwell Low red blood cell level--unusual weakness or fatigue,  dizziness, headache, trouble breathing Unusual bruising or bleeding Side effects that usually do not require medical attention (report to your care team if they continue or are bothersome): Diarrhea Fatigue Hair loss Loss of appetite Nausea Vomiting This list may not describe all possible side effects. Call your doctor for medical advice about side effects. You may report side effects to FDA at 1-800-FDA-1088. Where should I keep my medication? This medication is given in a hospital or clinic. It will not be stored at home. NOTE: This sheet is a summary. It may not cover all possible information. If you have questions about this medicine, talk to your doctor, pharmacist, or health care provider.  2024 Elsevier/Gold Standard (2021-11-28 00:00:00)

## 2023-08-28 ENCOUNTER — Inpatient Hospital Stay: Payer: Medicaid Other

## 2023-08-28 VITALS — BP 146/60 | HR 96 | Temp 97.5°F | Resp 18 | Ht 62.0 in | Wt 167.0 lb

## 2023-08-28 DIAGNOSIS — Z5112 Encounter for antineoplastic immunotherapy: Secondary | ICD-10-CM | POA: Diagnosis not present

## 2023-08-28 DIAGNOSIS — C3412 Malignant neoplasm of upper lobe, left bronchus or lung: Secondary | ICD-10-CM

## 2023-08-28 MED ORDER — DEXAMETHASONE SODIUM PHOSPHATE 10 MG/ML IJ SOLN
10.0000 mg | Freq: Once | INTRAMUSCULAR | Status: AC
  Administered 2023-08-28: 10 mg via INTRAVENOUS
  Filled 2023-08-28: qty 1

## 2023-08-28 MED ORDER — SODIUM CHLORIDE 0.9% FLUSH
10.0000 mL | INTRAVENOUS | Status: DC | PRN
  Administered 2023-08-28: 10 mL

## 2023-08-28 MED ORDER — SODIUM CHLORIDE 0.9 % IV SOLN
98.0000 mg/m2 | Freq: Once | INTRAVENOUS | Status: AC
  Administered 2023-08-28: 180 mg via INTRAVENOUS
  Filled 2023-08-28: qty 9

## 2023-08-28 MED ORDER — SODIUM CHLORIDE 0.9 % IV SOLN: INTRAVENOUS | Status: DC

## 2023-08-28 MED ORDER — HEPARIN SOD (PORK) LOCK FLUSH 100 UNIT/ML IV SOLN
500.0000 [IU] | Freq: Once | INTRAVENOUS | Status: AC | PRN
  Administered 2023-08-28: 500 [IU]

## 2023-08-28 NOTE — Patient Instructions (Signed)
 Etoposide  Injection What is this medication? ETOPOSIDE  (e toe POE side) treats some types of cancer. It works by slowing down the growth of cancer cells. This medicine may be used for other purposes; ask your health care provider or pharmacist if you have questions. COMMON BRAND NAME(S): Etopophos, Toposar , VePesid  What should I tell my care team before I take this medication? They need to know if you have any of these conditions: Infection Kidney disease Liver disease Low blood counts, such as low white cell, platelet, red cell counts An unusual or allergic reaction to etoposide , other medications, foods, dyes, or preservatives If you or your partner are pregnant or trying to get pregnant Breastfeeding How should I use this medication? This medication is injected into a vein. It is given by your care team in a hospital or clinic setting. Talk to your care team about the use of this medication in children. Special care may be needed. Overdosage: If you think you have taken too much of this medicine contact a poison control center or emergency room at once. NOTE: This medicine is only for you. Do not share this medicine with others. What if I miss a dose? Keep appointments for follow-up doses. It is important not to miss your dose. Call your care team if you are unable to keep an appointment. What may interact with this medication? Warfarin This list may not describe all possible interactions. Give your health care provider a list of all the medicines, herbs, non-prescription drugs, or dietary supplements you use. Also tell them if you smoke, drink alcohol, or use illegal drugs. Some items may interact with your medicine. What should I watch for while using this medication? Your condition will be monitored carefully while you are receiving this medication. This medication may make you feel generally unwell. This is not uncommon as chemotherapy can affect healthy cells as well as cancer  cells. Report any side effects. Continue your course of treatment even though you feel ill unless your care team tells you to stop. This medication can cause serious side effects. To reduce the risk, your care team may give you other medications to take before receiving this one. Be sure to follow the directions from your care team. This medication may increase your risk of getting an infection. Call your care team for advice if you get a fever, chills, sore throat, or other symptoms of a cold or flu. Do not treat yourself. Try to avoid being around people who are sick. This medication may increase your risk to bruise or bleed. Call your care team if you notice any unusual bleeding. Talk to your care team about your risk of cancer. You may be more at risk for certain types of cancers if you take this medication. Talk to your care team if you may be pregnant. Serious birth defects can occur if you take this medication during pregnancy and for 6 months after the last dose. You will need a negative pregnancy test before starting this medication. Contraception is recommended while taking this medication and for 6 months after the last dose. Your care team can help you find the option that works for you. If your partner can get pregnant, use a condom during sex while taking this medication and for 4 months after the last dose. Do not breastfeed while taking this medication. This medication may cause infertility. Talk to your care team if you are concerned about your fertility. What side effects may I notice from receiving this medication?  Side effects that you should report to your care team as soon as possible: Allergic reactions--skin rash, itching, hives, swelling of the face, lips, tongue, or throat Infection--fever, chills, cough, sore throat, wounds that don't heal, pain or trouble when passing urine, general feeling of discomfort or being unwell Low red blood cell level--unusual weakness or fatigue,  dizziness, headache, trouble breathing Unusual bruising or bleeding Side effects that usually do not require medical attention (report to your care team if they continue or are bothersome): Diarrhea Fatigue Hair loss Loss of appetite Nausea Vomiting This list may not describe all possible side effects. Call your doctor for medical advice about side effects. You may report side effects to FDA at 1-800-FDA-1088. Where should I keep my medication? This medication is given in a hospital or clinic. It will not be stored at home. NOTE: This sheet is a summary. It may not cover all possible information. If you have questions about this medicine, talk to your doctor, pharmacist, or health care provider.  2024 Elsevier/Gold Standard (2021-11-28 00:00:00) [facility] [place] - A DEPT OF [place]  Discharge Instructions: Thank you for choosing [facility] to provide your oncology and hematology care.  If you have a lab appointment with the Cancer Center, please go directly to the Cancer Center and check in at the registration area.   Wear comfortable clothing and clothing appropriate for easy access to any Portacath or PICC line.   We strive to give you quality time with your provider. You may need to reschedule your appointment if you arrive late (15 or more minutes).  Arriving late affects you and other patients whose appointments are after yours.  Also, if you miss three or more appointments without notifying the office, you may be dismissed from the clinic at the provider's discretion.      For prescription refill requests, have your pharmacy contact our office and allow 72 hours for refills to be completed.    Today you received the following chemotherapy and/or immunotherapy agents ETOPOSIDE       To help prevent nausea and vomiting after your treatment, we encourage you to take your nausea medication as directed.  BELOW ARE SYMPTOMS THAT SHOULD BE  REPORTED IMMEDIATELY: *FEVER GREATER THAN 100.4 F (38 C) OR HIGHER *CHILLS OR SWEATING *NAUSEA AND VOMITING THAT IS NOT CONTROLLED WITH YOUR NAUSEA MEDICATION *UNUSUAL SHORTNESS OF BREATH *UNUSUAL BRUISING OR BLEEDING *URINARY PROBLEMS (pain or burning when urinating, or frequent urination) *BOWEL PROBLEMS (unusual diarrhea, constipation, pain near the anus) TENDERNESS IN MOUTH AND THROAT WITH OR WITHOUT PRESENCE OF ULCERS (sore throat, sores in mouth, or a toothache) UNUSUAL RASH, SWELLING OR PAIN  UNUSUAL VAGINAL DISCHARGE OR ITCHING   Items with * indicate a potential emergency and should be followed up as soon as possible or go to the Emergency Department if any problems should occur.  Please show the CHEMOTHERAPY ALERT CARD or IMMUNOTHERAPY ALERT CARD at check-in to the Emergency Department and triage nurse.  Should you have questions after your visit or need to cancel or reschedule your appointment, please contact [facility]  - A DEPT OF [facility]  Dept: [phone]  and follow the prompts.  Office hours are 8:00 a.m. to 4:30 p.m. Monday - Friday. Please note that voicemails left after 4:00 p.m. may not be returned until the following business day.  We are closed weekends and major holidays. You have access to a nurse at all  times for urgent questions. Please call the main number to the clinic Dept: [phone] and follow the prompts.  For any non-urgent questions, you may also contact your provider using MyChart. We now offer e-Visits for anyone 82 and older to request care online for non-urgent symptoms. For details visit [email or web address].   Also download the MyChart app! Go to the app store, search MyChart, open the app, select [place], and log in with your MyChart username and password.

## 2023-08-31 ENCOUNTER — Other Ambulatory Visit: Payer: Self-pay | Admitting: Oncology

## 2023-08-31 ENCOUNTER — Telehealth: Payer: Self-pay | Admitting: Acute Care

## 2023-08-31 ENCOUNTER — Inpatient Hospital Stay: Payer: Medicaid Other

## 2023-08-31 VITALS — BP 141/88 | HR 112 | Temp 98.8°F | Resp 18 | Ht 62.0 in

## 2023-08-31 DIAGNOSIS — C3412 Malignant neoplasm of upper lobe, left bronchus or lung: Secondary | ICD-10-CM

## 2023-08-31 DIAGNOSIS — Z5112 Encounter for antineoplastic immunotherapy: Secondary | ICD-10-CM | POA: Diagnosis not present

## 2023-08-31 MED ORDER — PROCHLORPERAZINE MALEATE 10 MG PO TABS
10.0000 mg | ORAL_TABLET | Freq: Four times a day (QID) | ORAL | 3 refills | Status: DC | PRN
Start: 2023-08-31 — End: 2024-01-20

## 2023-08-31 MED ORDER — ONDANSETRON 4 MG PO TBDP: 4.0000 mg | ORAL_TABLET | Freq: Four times a day (QID) | ORAL | 3 refills | Status: DC | PRN

## 2023-08-31 MED ORDER — PEGFILGRASTIM-CBQV 6 MG/0.6ML ~~LOC~~ SOSY
6.0000 mg | PREFILLED_SYRINGE | Freq: Once | SUBCUTANEOUS | Status: AC
Start: 2023-08-31 — End: 2023-08-31
  Administered 2023-08-31: 6 mg via SUBCUTANEOUS
  Filled 2023-08-31: qty 0.6

## 2023-08-31 NOTE — Progress Notes (Addendum)
 0920 DISCUSSED WITH PATIENT THE IMPORTANCE OF GETTING HER BOWELS TO MOVE. THE PATIENT STATED THAT SHE WAS CONSTIPATED AND ONLY HAD A SMALL BOWEL MOVEMENT TODAY, THAT WAS HARD. SHE STATED THAT SHE WAS NAUSEATED AND FELT LIKE CRAP. I INFORMED PATIENT THAT THE FIRST PRIORITY WAS TO GET HER BOWELS MOVING IMMEDIATELY. I INFORMED HER THAT SHE WOULD FEEL BETTER AFTER THAT. I GAVE HER INSTRUCTIONS ON HOW TO TAKE THE MAGNESIUM CITRATE. SHE VERBALIZED UNDERSTANDING AND STATED THAT SHE WOULD TRY IT. SHE ALSO NEEDED HER NAUSEA MEDICINES SWITCHED FROM THE [facility] TO [place]. SHE STATED THAT HER INSURANCE WOULD NOT PAY FOR OUR PHARMACY. DR. Harles Lied WAS INFORMED TO CHANGE THE PRESCRIPTIONS TO [facility] IN [place]. ALL OF THE ABOVE WAS CHANGED AND FIXED FOR THE PATIENT. SHE GOT HER PRESCRIPTION FOR A CRANIAL PROTHESIS AND INFORMATION ON WHERE TO GET IT. HOPEFULLY SHE WILL FEEL BETTER AFTER SHE HAS A BOWEL MOVEMENT AND GET HER NAUSEA MEDICINES. DR. Harles Lied IS AWARE OF THE ABOVE.

## 2023-08-31 NOTE — Telephone Encounter (Signed)
 Please see signed encounter from 1/14 where we were asking Dana Roth to advise us  what RX alternative we should try for her.  PT calling now stating she needs more Trellegy and would like more samples because she can not afford it. Her # is [phone]

## 2023-08-31 NOTE — Telephone Encounter (Signed)
 Called and spoke to patient. She stated that trelegy is not covered by insurance. She would like a cheaper alternative. She is scheduled for surgery on 09/02/2023 and she has one dose of trelegy left.   PA, please advise on cheaper alternatives.    Sarah, please advise on samples? Thanks

## 2023-08-31 NOTE — Telephone Encounter (Signed)
 She is out of inhaler, has cancer and has surgery sched for this week. Please fill ASAP  Pharm is [facility] in [place].

## 2023-09-01 ENCOUNTER — Other Ambulatory Visit: Payer: Self-pay | Admitting: Acute Care

## 2023-09-01 ENCOUNTER — Other Ambulatory Visit (HOSPITAL_COMMUNITY): Payer: Self-pay

## 2023-09-01 ENCOUNTER — Telehealth: Payer: Self-pay

## 2023-09-01 DIAGNOSIS — J439 Emphysema, unspecified: Secondary | ICD-10-CM

## 2023-09-01 MED ORDER — BUDESONIDE-FORMOTEROL FUMARATE 80-4.5 MCG/ACT IN AERO: 2.0000 | INHALATION_SPRAY | Freq: Two times a day (BID) | RESPIRATORY_TRACT | 12 refills | Status: DC

## 2023-09-01 MED ORDER — TRELEGY ELLIPTA 100-62.5-25 MCG/ACT IN AEPB: 1.0000 | INHALATION_SPRAY | Freq: Every day | RESPIRATORY_TRACT | Status: AC

## 2023-09-01 MED ORDER — SPIRIVA RESPIMAT 2.5 MCG/ACT IN AERS: 2.0000 | INHALATION_SPRAY | Freq: Every day | RESPIRATORY_TRACT | 12 refills | Status: DC

## 2023-09-01 NOTE — Addendum Note (Signed)
Addended by: Gay Filler T on: 09/01/2023 03:29 PM   Modules accepted: Orders

## 2023-09-01 NOTE — Telephone Encounter (Signed)
Will await Sarah's response.

## 2023-09-01 NOTE — Telephone Encounter (Signed)
Dana Roth, please see below message and advise. Thanks

## 2023-09-01 NOTE — Telephone Encounter (Signed)
Per request for alternatives to Trelegy the following are preferred by the patients plan at this time:   To achieve triple therapy patient would have to use 1 inhaler in each category as follows:    ICS+LABA  Brand Advair Diksus- $4.00 Brand Symbicort- $4.00 Dulera- $4.00 Brand Advair HFA- $4.00  LAMA  Incruse- $4.00 Brand Spiriva Handihaler- $4.00 Brand Spiriva Respimat- $4.00

## 2023-09-01 NOTE — Telephone Encounter (Addendum)
Please see 2/11 phone note.   Spoke to patient. She stated that she would call back to schedule appt.

## 2023-09-01 NOTE — Telephone Encounter (Signed)
See other encounter for benefits investigations-02/11

## 2023-09-01 NOTE — Telephone Encounter (Signed)
I called and spoke with pt. I informed her of Sarah's note. Pt states she is having surgery tomorrow so will not be able pick up samples tomorrow. I informed pt that I would still leave sample sup front for her to pick up whenever she was able to get them. Pt verbalized understanding. NFN

## 2023-09-02 ENCOUNTER — Other Ambulatory Visit (HOSPITAL_COMMUNITY): Payer: Self-pay | Admitting: Vascular Surgery

## 2023-09-02 ENCOUNTER — Ambulatory Visit (HOSPITAL_COMMUNITY)
Admission: RE | Admit: 2023-09-02 | Discharge: 2023-09-02 | Disposition: A | Discharge status: Home / Self Care | Payer: Medicaid Other | Source: Ambulatory Visit | Attending: Vascular Surgery | Admitting: Vascular Surgery

## 2023-09-02 ENCOUNTER — Encounter (HOSPITAL_COMMUNITY): Payer: Self-pay

## 2023-09-02 ENCOUNTER — Other Ambulatory Visit: Payer: Self-pay

## 2023-09-02 DIAGNOSIS — F17210 Nicotine dependence, cigarettes, uncomplicated: Secondary | ICD-10-CM | POA: Insufficient documentation

## 2023-09-02 DIAGNOSIS — Z95828 Presence of other vascular implants and grafts: Secondary | ICD-10-CM

## 2023-09-02 DIAGNOSIS — T81597A Other complications of foreign body accidentally left in body following removal of catheter or packing, initial encounter: Secondary | ICD-10-CM | POA: Diagnosis present

## 2023-09-02 DIAGNOSIS — Y839 Surgical procedure, unspecified as the cause of abnormal reaction of the patient, or of later complication, without mention of misadventure at the time of the procedure: Secondary | ICD-10-CM | POA: Diagnosis not present

## 2023-09-02 DIAGNOSIS — I10 Essential (primary) hypertension: Secondary | ICD-10-CM | POA: Insufficient documentation

## 2023-09-02 DIAGNOSIS — C3490 Malignant neoplasm of unspecified part of unspecified bronchus or lung: Secondary | ICD-10-CM | POA: Insufficient documentation

## 2023-09-02 DIAGNOSIS — D169 Benign neoplasm of bone and articular cartilage, unspecified: Secondary | ICD-10-CM | POA: Diagnosis not present

## 2023-09-02 DIAGNOSIS — K219 Gastro-esophageal reflux disease without esophagitis: Secondary | ICD-10-CM | POA: Insufficient documentation

## 2023-09-02 HISTORY — PX: IR US GUIDE VASC ACCESS RIGHT: IMG2390

## 2023-09-02 HISTORY — PX: IR TRANSCATH RETRIEVAL FB INCL GUIDANCE (MS): IMG5375

## 2023-09-02 HISTORY — PX: IR VENO/JUGULAR RIGHT: IMG2275

## 2023-09-02 MED ORDER — LIDOCAINE-EPINEPHRINE 1 %-1:100000 IJ SOLN
INTRAMUSCULAR | Status: AC
  Filled 2023-09-02: qty 1

## 2023-09-02 MED ORDER — HYDROCODONE-ACETAMINOPHEN 5-325 MG PO TABS
ORAL_TABLET | ORAL | Status: AC
  Filled 2023-09-02: qty 1

## 2023-09-02 MED ORDER — MIDAZOLAM HCL 2 MG/2ML IJ SOLN
INTRAMUSCULAR | Status: AC
  Filled 2023-09-02: qty 2

## 2023-09-02 MED ORDER — FENTANYL CITRATE (PF) 100 MCG/2ML IJ SOLN
INTRAMUSCULAR | Status: AC
  Filled 2023-09-02: qty 2

## 2023-09-02 MED ORDER — HYDROCODONE-ACETAMINOPHEN 5-325 MG PO TABS
1.0000 | ORAL_TABLET | Freq: Once | ORAL | Status: AC
Start: 2023-09-02 — End: 2023-09-02
  Administered 2023-09-02: 1 via ORAL

## 2023-09-02 MED ORDER — MIDAZOLAM HCL 2 MG/2ML IJ SOLN
INTRAMUSCULAR | Status: AC | PRN
  Administered 2023-09-02 (×2): .5 mg via INTRAVENOUS
  Administered 2023-09-02: 1 mg via INTRAVENOUS

## 2023-09-02 MED ORDER — FENTANYL CITRATE (PF) 100 MCG/2ML IJ SOLN
INTRAMUSCULAR | Status: AC
Start: 2023-09-02 — End: ?
  Filled 2023-09-02: qty 2

## 2023-09-02 MED ORDER — FENTANYL CITRATE (PF) 100 MCG/2ML IJ SOLN
INTRAMUSCULAR | Status: AC | PRN
  Administered 2023-09-02 (×2): 25 ug via INTRAVENOUS
  Administered 2023-09-02: 50 ug via INTRAVENOUS
  Administered 2023-09-02: 25 ug via INTRAVENOUS

## 2023-09-02 NOTE — Procedures (Signed)
Vascular and Interventional Radiology Procedure Note  Patient: Dana Roth DOB: Feb 14, 1961 Medical Record Number: [id number] Note Date/Time: 09/02/23 4:48 PM   Performing Physician: Roanna Banning, MD Assistant(s): None  Diagnosis: Retained catheter fragment s/p port removal at OSH  Procedure:  RIGHT JUGULAR VENOGRAPHY FOREIGN BODY RETRIEVAL  Anesthesia: Conscious Sedation Complications: None Estimated Blood Loss: Minimal Specimens: None  Findings:  - access via the RIGHT  greater saphenous vein  artery. - R jugular venogram with intravascular retained catheter fragment.  - Catheter snared and removed intact.  Final report to follow once all images are reviewed and compared with previous studies.  See detailed dictation with images in PACS. The patient tolerated the procedure well without incident or complication and was returned to Recovery in stable condition.    Roanna Banning, MD [facility]   Pager. [phone] Clinic. [phone]

## 2023-09-02 NOTE — Consult Note (Signed)
Chief Complaint: Patient was seen in consultation today for foreign body  Referring Physician(s): Lininger,Michael D.  Supervising Physician: Roanna Banning  Patient Status: [facility] - Out-pt  History of Present Illness: Dana Roth is a 63 y.o. female wit history of GERD, HTN, small cell lung cancer, multiple osteochondroma with Port-A-Cath in place.  Patient underwent Port-A-Cath removal with surgery today at [facility].  Unfortunately catheter fractured and a length of the catheter remained behind and was unable to be extracted in the OR.  Patient discharged to [facility] for foreign body retrieval.    Patient arrives to [facility] accompanied by her cousin who is her HCPOA.  She is upset by the events of the day and the need for additional interventions, however is aware of the goals and is agreeable to proceed with retrieval attempt.   She has been NPO.  Cousin is available for care and transportation post procedure.   Past Medical History:  Diagnosis Date   Arthritis    Complication of anesthesia    problem with B/P dropping   Depression    Dyslipidemia    GERD (gastroesophageal reflux disease)    HTN (hypertension)    "only with MD visits, caused by anxiety",med taken caused chest pain-so stopped.   Multiple osteochondroma    Multiple bone surgeries   Needle phobia    PONV (postoperative nausea and vomiting)    Rectal bleeding    Small cell lung cancer (HCC)    Suspicious mole     Past Surgical History:  Procedure Laterality Date   bone tumors     Multiple excisions-all over body "occ. bones lock up"   BRONCHIAL BIOPSY  06/30/2023   Procedure: BRONCHIAL BIOPSIES;  Surgeon: Josephine Igo, DO;  Location: [facility] ENDOSCOPY;  Service: Pulmonary;;   CHOLECYSTECTOMY     COLONOSCOPY WITH PROPOFOL N/A 06/02/2013   Procedure: COLONOSCOPY WITH PROPOFOL;  Surgeon: Shirley Friar, MD;  Location: [facility] ENDOSCOPY;  Service: Endoscopy;  Laterality: N/A;    COLONOSCOPY WITH PROPOFOL N/A 05/07/2017   Procedure: COLONOSCOPY WITH PROPOFOL;  Surgeon: Charlott Rakes, MD;  Location: [facility] ENDOSCOPY;  Service: Endoscopy;  Laterality: N/A;   HEMOSTASIS CONTROL  06/30/2023   Procedure: HEMOSTASIS CONTROL;  Surgeon: Josephine Igo, DO;  Location: [facility] ENDOSCOPY;  Service: Pulmonary;;   HOT HEMOSTASIS N/A 05/07/2017   Procedure: HOT HEMOSTASIS (ARGON PLASMA COAGULATION/BICAP);  Surgeon: Charlott Rakes, MD;  Location: [facility] ENDOSCOPY;  Service: Endoscopy;  Laterality: N/A;   VIDEO BRONCHOSCOPY N/A 06/30/2023   Procedure: VIDEO BRONCHOSCOPY;  Surgeon: Josephine Igo, DO;  Location: [facility] ENDOSCOPY;  Service: Pulmonary;  Laterality: N/A;    Allergies: Codeine  Medications: Prior to Admission medications   Medication Sig Start Date End Date Taking? Authorizing Provider  clonazePAM (KLONOPIN) 0.5 MG tablet Take 0.5 mg by mouth 3 (three) times daily as needed.   Yes [provider]  cyclobenzaprine (FLEXERIL) 5 MG tablet 1 tablet as needed Orally Once a day   Yes [provider]  Fluticasone-Umeclidin-Vilant (TRELEGY ELLIPTA) 100-62.5-25 MCG/ACT AEPB Inhale 1 puff into the lungs daily. 09/01/23  Yes Bevelyn Ngo, NP  loratadine (CLARITIN) 10 MG tablet Take 10 mg by mouth daily. For 5 days, starting day she receives WBC injection. Repeat each cycle. 08/10/23  Yes [provider]  ondansetron (ZOFRAN-ODT) 4 MG disintegrating tablet Take 1 tablet (4 mg total) by mouth every 6 (six) hours as needed for nausea or vomiting. 08/31/23  Yes Weston Settle, MD  albuterol (VENTOLIN HFA) 108 (90 Base) MCG/ACT inhaler Inhale 2 puffs into the lungs every 6 (six) hours as needed for wheezing or shortness of breath. 08/26/23   Melvyn Neth, Dequincy A, MD  budesonide-formoterol (SYMBICORT) 80-4.5 MCG/ACT inhaler Inhale 2 puffs into the lungs 2 (two) times daily. 09/01/23   Bevelyn Ngo, NP  busPIRone (BUSPAR) 10 MG tablet 1 tablet Orally once a day as needed     [provider]  doxycycline (VIBRA-TABS) 100 MG tablet Take 1 tablet (100 mg total) by mouth 2 (two) times daily. 06/24/23   Kirby Crigler, Mir M, MD  gabapentin (NEURONTIN) 400 MG capsule 2 capsules Orally Once a day    [provider]  HYDROcodone-acetaminophen (NORCO) 7.5-325 MG tablet Take 1 tablet by mouth every 6 (six) hours as needed for moderate pain (pain score 4-6). 08/26/23   Melvyn Neth, Dequincy A, MD  hydrocortisone 2.5 % cream 1 application Rectally once to three times a day for 30 day(s) 05/07/17   [provider]  LORazepam (ATIVAN) 1 MG tablet 1-2 tabs po - take 1 hour before brain scan 08/03/23   Rennis Harding A, MD  meloxicam (MOBIC) 15 MG tablet 1 tablet Orally Once a day    [provider]  polyethylene glycol powder (MIRALAX) 17 GM/SCOOP powder 1 scoop in 8 oz of any liquid Orally Once a day for 30 day(s) 10/15/21   [provider]  prochlorperazine (COMPAZINE) 10 MG tablet Take 1 tablet (10 mg total) by mouth every 6 (six) hours as needed for nausea or vomiting. 08/31/23   Melvyn Neth, Dequincy A, MD  sennosides-docusate sodium (SENOKOT-S) 8.6-50 MG tablet Take 2 tablets by mouth in the morning and at bedtime. 08/07/23   [provider]  Tiotropium Bromide Monohydrate (SPIRIVA RESPIMAT) 2.5 MCG/ACT AERS Inhale 2 puffs into the lungs daily. 09/01/23   Bevelyn Ngo, NP  tiZANidine (ZANAFLEX) 4 MG capsule Take 4 mg by mouth 3 (three) times daily as needed for muscle spasms.    [provider]     Family History  Problem Relation Age of Onset   Heart disease Brother        "Hole in heart"   CAD Sister     Social History   Socioeconomic History   Marital status: Divorced    Spouse name: Not on file   Number of children: 0   Years of education: 8   Highest education level: Not on file  Occupational History    Comment: [employer]   Occupation: DISABLED  Tobacco Use   Smoking status: Every Day    Current  packs/day: 1.00    Average packs/day: 1 pack/day for 40.0 years (40.0 ttl pk-yrs)    Types: Cigarettes   Smokeless tobacco: Never  Vaping Use   Vaping status: Former  Substance and Sexual Activity   Alcohol use: No   Drug use: Yes    Frequency: 7.0 times per week    Types: Marijuana    Comment: pt smoke marijuana everyday, last used 04/1717   Sexual activity: Not Currently  Other Topics Concern   Not on file  Social History Narrative   Not on file   Social Drivers of Health   Financial Resource Strain: Not on file  Food Insecurity: Food Insecurity Present (07/09/2023)   Hunger Vital Sign    Worried About Running Out of Food in the Last Year: Sometimes true    Ran Out of Food in the Last Year: Sometimes true  Transportation Needs: No Transportation Needs (07/09/2023)   PRAPARE - Administrator, Civil Service (Medical): No    Lack of Transportation (Non-Medical): No  Physical Activity: Not on file  Stress: Not on file  Social Connections: Not on file     Review of Systems: A 12 point ROS discussed and pertinent positives are indicated in the HPI above.  All other systems are negative.  Review of Systems  Constitutional:  Negative for fatigue and fever.  Respiratory:  Negative for cough and shortness of breath.   Cardiovascular:  Negative for chest pain.  Gastrointestinal:  Negative for abdominal pain, nausea and vomiting.  Musculoskeletal:  Negative for back pain.  Psychiatric/Behavioral:  Negative for behavioral problems and confusion.     Vital Signs: BP 131/73   Pulse (!) 101   Temp 98.2 F (36.8 C)   Resp 16   SpO2 100%   Physical Exam Vitals and nursing note reviewed.  Constitutional:      General: She is not in acute distress.    Appearance: Normal appearance. She is not ill-appearing.  HENT:     Mouth/Throat:     Mouth: Mucous membranes are moist.     Pharynx: Oropharynx is clear.  Cardiovascular:     Rate and Rhythm: Normal rate and  regular rhythm.  Pulmonary:     Effort: Pulmonary effort is normal. No respiratory distress.     Breath sounds: Normal breath sounds.  Abdominal:     General: Abdomen is flat.     Palpations: Abdomen is soft.  Skin:    General: Skin is warm and dry.  Neurological:     General: No focal deficit present.     Mental Status: She is alert and oriented to person, place, and time. Mental status is at baseline.  Psychiatric:        Mood and Affect: Mood normal.        Behavior: Behavior normal.        Thought Content: Thought content normal.        Judgment: Judgment normal.      MD Evaluation Airway: WNL Heart: WNL Abdomen: WNL Chest/ Lungs: WNL ASA  Classification: 3 Mallampati/Airway Score: Two   Imaging: No results found.  Labs:  CBC: Recent Labs    06/23/23 1842 07/09/23 1433 08/05/23 0914 08/26/23 0839  WBC 7.7 11.0* 8.8 8.1  HGB 15.1* 15.6* 15.3* 12.8  HCT 45.5 47.3* 44.2 37.9  PLT 187 199 233 281    COAGS: No results for input(s): "INR", "APTT" in the last 8760 hours.  BMP: Recent Labs    06/23/23 1842 07/09/23 1433 08/05/23 0914 08/26/23 0839  NA 131* 136 133* 139  K 3.6 4.4 4.0 4.3  CL 97* 100 96* 104  CO2 27 25 26 26   GLUCOSE 133* 91 92 73  BUN 11 11 6* 10  CALCIUM 9.3 9.6 9.9 9.6  CREATININE 0.68 0.80 0.68 0.66  GFRNONAA >60 >60 >60 >60    LIVER FUNCTION TESTS: Recent Labs    06/23/23 1842 07/09/23 1433 08/05/23 0914 08/26/23 0839  BILITOT 0.5 0.3 0.3 <0.2  AST 18 24 28  14*  ALT 23 29 18 12   ALKPHOS 90 108 123 129*  PROT 7.4 7.3 7.4 7.2  ALBUMIN 4.2 4.3 4.5 3.9    TUMOR MARKERS: No results for input(s): "AFPTM", "CEA", "CA199", "CHROMGRNA" in the last 8760 hours.  Assessment and Plan: Patient with past medical history of small cell lung cancer, osteochondromas with  Port-A-Cath in presents with complaint of foreign body after attempted Port removal today.  IR consulted for foreign body removal at the request of Dr.  Georgiana Shore. Case reviewed by Dr. Milford Cage who approves patient for procedure.  Patient presents today in their usual state of health.  She has been NPO and is not currently on blood thinners.   Risks and benefits of foreign body removal was discussed with the patient and/or patient's family including, but not limited to bleeding, infection, damage to adjacent structures or low yield requiring additional tests.  All of the questions were answered and there is agreement to proceed.  Consent signed and in chart.   Thank you for this interesting consult.  I greatly enjoyed meeting Dana Roth and look forward to participating in their care.  A copy of this report was sent to the requesting provider on this date.  Electronically Signed: Hoyt Koch, PA 09/02/2023, 4:17 PM   I spent a total of  30 Minutes   in face to face in clinical consultation, greater than 50% of which was counseling/coordinating care for small cell lung cancer.

## 2023-09-02 NOTE — Discharge Instructions (Signed)
Activity Rest as told by your health care provider. Return to your normal activities as told by your health care provider. Ask your health care provider what activities are safe for you. May shower and remove bandage 24 hours after discharge. If you were given a sedative during the procedure, it can affect you for several hours. Do not drive or operate machinery until your health care provider says that it is safe. General instructions      Check your site area every day for signs of infection. Check for: Redness, swelling, or pain. Fluid or blood. Warmth. Pus or a bad smell. Take over-the-counter and prescription medicines only as told by your health care provider. Contact a health care provider if: You have redness, swelling, warmth, pus, or pain around the port site.

## 2023-09-03 ENCOUNTER — Other Ambulatory Visit (HOSPITAL_BASED_OUTPATIENT_CLINIC_OR_DEPARTMENT_OTHER): Payer: Self-pay

## 2023-09-04 ENCOUNTER — Telehealth: Payer: Self-pay | Admitting: Acute Care

## 2023-09-04 DIAGNOSIS — J439 Emphysema, unspecified: Secondary | ICD-10-CM

## 2023-09-04 MED ORDER — BUDESONIDE-FORMOTEROL FUMARATE 80-4.5 MCG/ACT IN AERO: 2.0000 | INHALATION_SPRAY | Freq: Two times a day (BID) | RESPIRATORY_TRACT | 12 refills | Status: DC

## 2023-09-04 MED ORDER — SPIRIVA RESPIMAT 2.5 MCG/ACT IN AERS: 2.0000 | INHALATION_SPRAY | Freq: Every day | RESPIRATORY_TRACT | 12 refills | Status: DC

## 2023-09-04 NOTE — Telephone Encounter (Signed)
Symbicort needs to be sent to pts preferred pharmacy. I called and spoke to pt who stated it was a rx for Symbicort and Spiriva. I informed pt I would send both in to preferred pharmacy. NFN

## 2023-09-07 ENCOUNTER — Encounter: Payer: Self-pay | Admitting: Acute Care

## 2023-09-07 ENCOUNTER — Ambulatory Visit: Payer: Medicaid Other | Admitting: Acute Care

## 2023-09-07 VITALS — BP 120/77 | HR 108 | Ht 64.0 in | Wt 168.6 lb

## 2023-09-07 DIAGNOSIS — F17210 Nicotine dependence, cigarettes, uncomplicated: Secondary | ICD-10-CM | POA: Diagnosis not present

## 2023-09-07 DIAGNOSIS — C3490 Malignant neoplasm of unspecified part of unspecified bronchus or lung: Secondary | ICD-10-CM | POA: Diagnosis not present

## 2023-09-07 DIAGNOSIS — F17200 Nicotine dependence, unspecified, uncomplicated: Secondary | ICD-10-CM

## 2023-09-07 DIAGNOSIS — J449 Chronic obstructive pulmonary disease, unspecified: Secondary | ICD-10-CM

## 2023-09-07 DIAGNOSIS — R0609 Other forms of dyspnea: Secondary | ICD-10-CM

## 2023-09-07 NOTE — Patient Instructions (Signed)
 It is good to see you today.  Continue using Symbicort 2 puffs in the morning and 2 puffs in the evening. Every day Rinse mouth after use. Continue Spiriva 2 puff once daily.Every day. Rinse mouth after use.  Continue your cancer treatment per oncology.  Please contact office for sooner follow up if symptoms do not improve or worsen or seek emergency care   You can receive free nicotine replacement therapy ( patches, gum or mints) by calling 1-800-QUIT NOW. Please call so we can get you on the path to becoming  a non-smoker. I know it is hard, but you can do this!  Other options for assistance in smoking cessation ( As covered by your insurance benefits)  Hypnosis for smoking cessation  [employer] [phone]  Acupuncture for smoking cessation  [employer] [phone]

## 2023-09-07 NOTE — Progress Notes (Signed)
 History of Present Illness Dana Roth is a 63 y.o. female current every day smoker with new diagnosis of extensive stage small cell lung cancer 06/2023, which includes spread of disease to her liver, COPD, GERD, depression, and anxiety . She has been followed by Dr. Tonia Brooms in the past.    Synopsis 63 year old woman w/ hx of GERD, depression, heavy smoking and anxiety presenting with wheezing x 2 weeks. Baseline MMRC 0 dyspnea on no inhalers. Now MMRC 1-2 with wheezing. Imaging reveals extensive lung cancer. Pulmonary is consulted to expedite workup. No hemoptysis, + 7-8 lb weight loss in past month. Imaging done in the ED was positive for what appears to be advanced lung cancer- looks to be at least 3b. Plan was to  attempt to treat for concurrent bronchospasm although may be structural. She was treated with prednisone and Doxycycline, an outpatient referral was made to oncology, and she was scheduled for bronchoscopy 06/30/2023 for tissue diagnosis.  Biopsy was positive for extensive stafe small cell lung cancer with metastatic disease to her liver.    09/07/2023 Pt. Presents for follow up. She states she has been doing well. She is undergoing chemo and radiation treatments for her lung cancer. She is currently waiting for a reinsertion of a port a cath to be placed so she can remain current with her chemotherapy.  She states that her dyspnea is much better since she has been using her Trelegy. Her insurance will not pay for trelegy, but they will pay for Symbicort and Spiriva. She has brought that with her today. She needs instructions to use them.   Nursing will instruct patient on use of both her Symbicort and her Spiriva.  I have explained that these are for her COPD.  She verbalized understanding.    She is anxious to resume her chemotherapy and radiation treatments.  She is struggling a bit with this therapy as she has lost her hair however she does state that her breathing is much  better.  Patient was given some Trelegy samples today as that is her preferred inhaler despite the fact her insurance will not pay for it.  Once her Trelegy samples are depleted she will start Symbicort and Spiriva for her triple therapy for her COPD.  We discussed when she should return to the clinic for flares by noting her daily symptoms > remember "red flags" for COPD:  Increase in cough, increase in sputum production, increase in shortness of breath or activity intolerance. If she notices these symptoms, we have asked her to  call to be seen.    I have counseled her to quit smoking completely. She verbalized understanding, but states her anxiety is currently a barrier to quitting.     Test Results: No new testing today     Latest Ref Rng & Units 08/26/2023    8:39 AM 08/05/2023    9:14 AM 07/09/2023    2:33 PM  CBC  WBC 4.0 - 10.5 K/uL 8.1  8.8  11.0   Hemoglobin 12.0 - 15.0 g/dL 59.5  63.8  75.6   Hematocrit 36.0 - 46.0 % 37.9  44.2  47.3   Platelets 150 - 400 K/uL 281  233  199        Latest Ref Rng & Units 08/26/2023    8:39 AM 08/05/2023    9:14 AM 07/09/2023    2:33 PM  BMP  Glucose 70 - 99 mg/dL 73  92  91   BUN 8 -  23 mg/dL 10  6  11    Creatinine 0.44 - 1.00 mg/dL 1.61  0.96  0.45   Sodium 135 - 145 mmol/L 139  133  136   Potassium 3.5 - 5.1 mmol/L 4.3  4.0  4.4   Chloride 98 - 111 mmol/L 104  96  100   CO2 22 - 32 mmol/L 26  26  25    Calcium 8.9 - 10.3 mg/dL 9.6  9.9  9.6     BNP No results found for: "BNP"  ProBNP No results found for: "PROBNP"  PFT No results found for: "FEV1PRE", "FEV1POST", "FVCPRE", "FVCPOST", "TLC", "DLCOUNC", "PREFEV1FVCRT", "PSTFEV1FVCRT"  IR TRANSCATH RETRIEVAL FB INCL GUIDANCE (MS) Result Date: 09/04/2023 INDICATION: port complication; FB retrieval; foreign body removal Briefly, history of lung cancer s/p surgically-placed RIGHT chest port 07/17/2023, malfunctioning with kinking and attempted removal at referring institution on  07/01/2024. Retained catheter. EXAM: Procedures: 1. RIGHT JUGULAR VENOGRAM 2. INTRAVASCULAR FOREIGN BODY RETRIEVAL COMPARISON:  Chest XR, 08/10/2023. MEDICATIONS: None. ANESTHESIA/SEDATION: Moderate (conscious) sedation was employed during this procedure. A total of Versed 2 mg and Fentanyl 125 mcg was administered intravenously. Moderate Sedation Time: 40 minutes. The patient's level of consciousness and vital signs were monitored continuously by radiology nursing throughout the procedure under my direct supervision. CONTRAST:  10 mL Omnipaque 300 COMPLICATIONS: None immediate TECHNIQUE: Informed written consent was obtained from the patient following explanation of the procedure, risks, benefits and alternatives. A time out was performed prior to the initiation of the procedure. Maximal barrier sterile technique utilized including caps, mask, sterile gowns, sterile gloves, large sterile drape, hand hygiene, and Betadine prep. Under sterile condition and local anesthesia, RIGHT common femoral venous access was performed with ultrasound. An ultrasound image was saved and sent to PACS. Over a guidewire, a 30 mm Ensnare device was advanced into the SVC. The proximal end of the catheter fragment was noted to be mobile. This was snared, and the fragment was retracted in its entirety out the 6 Fr sheath. Follow-up spot radiograph shows no retained fragments. RIGHT jugular venogram performed through the sheath shows no intravascular thrombus, stenosis, extravasation, dissection, or retained foreign body. The sheath was removed and hemostasis was obtained with manual compression. A dressing was placed. The patient tolerated the procedure well without immediate post procedural complication. FINDINGS: Retained intravascular catheter fragment was localized in the RIGHT jugular vein. The catheter was snared from a RIGHT femoral and removed in its entirety. IMPRESSION: Successful endovascular retrieval of a retained port  catheter fragment from the RIGHT jugular vein. Roanna Banning, MD [facility] Radiology Electronically Signed   By: Roanna Banning M.D.   On: 09/04/2023 19:11   IR US Guide Vasc Access Right Result Date: 09/04/2023 INDICATION: port complication; FB retrieval; foreign body removal Briefly, history of lung cancer s/p surgically-placed RIGHT chest port 07/17/2023, malfunctioning with kinking and attempted removal at referring institution on 07/01/2024. Retained catheter. EXAM: Procedures: 1. RIGHT JUGULAR VENOGRAM 2. INTRAVASCULAR FOREIGN BODY RETRIEVAL COMPARISON:  Chest XR, 08/10/2023. MEDICATIONS: None. ANESTHESIA/SEDATION: Moderate (conscious) sedation was employed during this procedure. A total of Versed 2 mg and Fentanyl 125 mcg was administered intravenously. Moderate Sedation Time: 40 minutes. The patient's level of consciousness and vital signs were monitored continuously by radiology nursing throughout the procedure under my direct supervision. CONTRAST:  10 mL Omnipaque 300 COMPLICATIONS: None immediate TECHNIQUE: Informed written consent was obtained from the patient following explanation of the procedure, risks, benefits and alternatives. A time out was  performed prior to the initiation of the procedure. Maximal barrier sterile technique utilized including caps, mask, sterile gowns, sterile gloves, large sterile drape, hand hygiene, and Betadine prep. Under sterile condition and local anesthesia, RIGHT common femoral venous access was performed with ultrasound. An ultrasound image was saved and sent to PACS. Over a guidewire, a 30 mm Ensnare device was advanced into the SVC. The proximal end of the catheter fragment was noted to be mobile. This was snared, and the fragment was retracted in its entirety out the 6 Fr sheath. Follow-up spot radiograph shows no retained fragments. RIGHT jugular venogram performed through the sheath shows no intravascular thrombus,  stenosis, extravasation, dissection, or retained foreign body. The sheath was removed and hemostasis was obtained with manual compression. A dressing was placed. The patient tolerated the procedure well without immediate post procedural complication. FINDINGS: Retained intravascular catheter fragment was localized in the RIGHT jugular vein. The catheter was snared from a RIGHT femoral and removed in its entirety. IMPRESSION: Successful endovascular retrieval of a retained port catheter fragment from the RIGHT jugular vein. Roanna Banning, MD [facility] Radiology Electronically Signed   By: Roanna Banning M.D.   On: 09/04/2023 19:11   IR Veno/Jugular Right Result Date: 09/04/2023 INDICATION: port complication; FB retrieval; foreign body removal Briefly, history of lung cancer s/p surgically-placed RIGHT chest port 07/17/2023, malfunctioning with kinking and attempted removal at referring institution on 07/01/2024. Retained catheter. EXAM: Procedures: 1. RIGHT JUGULAR VENOGRAM 2. INTRAVASCULAR FOREIGN BODY RETRIEVAL COMPARISON:  Chest XR, 08/10/2023. MEDICATIONS: None. ANESTHESIA/SEDATION: Moderate (conscious) sedation was employed during this procedure. A total of Versed 2 mg and Fentanyl 125 mcg was administered intravenously. Moderate Sedation Time: 40 minutes. The patient's level of consciousness and vital signs were monitored continuously by radiology nursing throughout the procedure under my direct supervision. CONTRAST:  10 mL Omnipaque 300 COMPLICATIONS: None immediate TECHNIQUE: Informed written consent was obtained from the patient following explanation of the procedure, risks, benefits and alternatives. A time out was performed prior to the initiation of the procedure. Maximal barrier sterile technique utilized including caps, mask, sterile gowns, sterile gloves, large sterile drape, hand hygiene, and Betadine prep. Under sterile condition and local  anesthesia, RIGHT common femoral venous access was performed with ultrasound. An ultrasound image was saved and sent to PACS. Over a guidewire, a 30 mm Ensnare device was advanced into the SVC. The proximal end of the catheter fragment was noted to be mobile. This was snared, and the fragment was retracted in its entirety out the 6 Fr sheath. Follow-up spot radiograph shows no retained fragments. RIGHT jugular venogram performed through the sheath shows no intravascular thrombus, stenosis, extravasation, dissection, or retained foreign body. The sheath was removed and hemostasis was obtained with manual compression. A dressing was placed. The patient tolerated the procedure well without immediate post procedural complication. FINDINGS: Retained intravascular catheter fragment was localized in the RIGHT jugular vein. The catheter was snared from a RIGHT femoral and removed in its entirety. IMPRESSION: Successful endovascular retrieval of a retained port catheter fragment from the RIGHT jugular vein. Roanna Banning, MD [facility] Radiology Electronically Signed   By: Roanna Banning M.D.   On: 09/04/2023 19:11     Past medical hx Past Medical History:  Diagnosis Date   Arthritis    Complication of anesthesia    problem with B/P dropping   Depression    Dyslipidemia    GERD (gastroesophageal reflux disease)  HTN (hypertension)    "only with MD visits, caused by anxiety",med taken caused chest pain-so stopped.   Multiple osteochondroma    Multiple bone surgeries   Needle phobia    PONV (postoperative nausea and vomiting)    Rectal bleeding    Small cell lung cancer (HCC)    Suspicious mole      Social History   Tobacco Use   Smoking status: Every Day    Current packs/day: 1.00    Average packs/day: 1 pack/day for 40.0 years (40.0 ttl pk-yrs)    Types: Cigarettes   Smokeless tobacco: Never  Vaping Use   Vaping status: Former  Substance Use  Topics   Alcohol use: No   Drug use: Yes    Frequency: 7.0 times per week    Types: Marijuana    Comment: pt smoke marijuana everyday, last used 04/1717    Dana Roth reports that she has been smoking cigarettes. She has a 40 pack-year smoking history. She has never used smokeless tobacco. She reports current drug use. Frequency: 7.00 times per week. Drug: Marijuana. She reports that she does not drink alcohol.  Tobacco Cessation: Current every day smoker Current treatment for small cell lung cancer with spread to her liver Counseled x 3-4 minutes on smoking cessation  Past surgical hx, Family hx, Social hx all reviewed.  Current Outpatient Medications on File Prior to Visit  Medication Sig   albuterol (VENTOLIN HFA) 108 (90 Base) MCG/ACT inhaler Inhale 2 puffs into the lungs every 6 (six) hours as needed for wheezing or shortness of breath.   budesonide-formoterol (SYMBICORT) 80-4.5 MCG/ACT inhaler Inhale 2 puffs into the lungs 2 (two) times daily.   busPIRone (BUSPAR) 10 MG tablet 1 tablet Orally once a day as needed   clonazePAM (KLONOPIN) 0.5 MG tablet Take 0.5 mg by mouth 3 (three) times daily as needed.   cyclobenzaprine (FLEXERIL) 5 MG tablet 1 tablet as needed Orally Once a day   doxycycline (VIBRA-TABS) 100 MG tablet Take 1 tablet (100 mg total) by mouth 2 (two) times daily.   Fluticasone-Umeclidin-Vilant (TRELEGY ELLIPTA) 100-62.5-25 MCG/ACT AEPB Inhale 1 puff into the lungs daily.   gabapentin (NEURONTIN) 400 MG capsule 2 capsules Orally Once a day   HYDROcodone-acetaminophen (NORCO) 7.5-325 MG tablet Take 1 tablet by mouth every 6 (six) hours as needed for moderate pain (pain score 4-6).   hydrocortisone 2.5 % cream 1 application Rectally once to three times a day for 30 day(s)   loratadine (CLARITIN) 10 MG tablet Take 10 mg by mouth daily. For 5 days, starting day she receives WBC injection. Repeat each cycle.   LORazepam (ATIVAN) 1 MG tablet 1-2 tabs po - take 1 hour  before brain scan   meloxicam (MOBIC) 15 MG tablet 1 tablet Orally Once a day   ondansetron (ZOFRAN-ODT) 4 MG disintegrating tablet Take 1 tablet (4 mg total) by mouth every 6 (six) hours as needed for nausea or vomiting.   polyethylene glycol powder (MIRALAX) 17 GM/SCOOP powder 1 scoop in 8 oz of any liquid Orally Once a day for 30 day(s)   prochlorperazine (COMPAZINE) 10 MG tablet Take 1 tablet (10 mg total) by mouth every 6 (six) hours as needed for nausea or vomiting.   sennosides-docusate sodium (SENOKOT-S) 8.6-50 MG tablet Take 2 tablets by mouth in the morning and at bedtime.   Tiotropium Bromide Monohydrate (SPIRIVA RESPIMAT) 2.5 MCG/ACT AERS Inhale 2 puffs into the lungs daily.   tiZANidine (ZANAFLEX) 4 MG capsule Take 4  mg by mouth 3 (three) times daily as needed for muscle spasms.   No current facility-administered medications on file prior to visit.     Allergies  Allergen Reactions   Codeine     Review Of Systems:  Constitutional:   No  weight loss, night sweats,  Fevers, chills, fatigue, or  lassitude.  HEENT:   No headaches,  Difficulty swallowing,  Tooth/dental problems, or  Sore throat,                No sneezing, itching, ear ache, nasal congestion, post nasal drip,   CV:  No chest pain,  Orthopnea, PND, swelling in lower extremities, anasarca, dizziness, palpitations, syncope.   GI  No heartburn, indigestion, abdominal pain, nausea, vomiting, diarrhea, change in bowel habits, loss of appetite, bloody stools.   Resp: + shortness of breath with exertion less at rest.  + excess mucus, no productive cough,  No non-productive cough,  No coughing up of blood.  No change in color of mucus.  No wheezing.  No chest wall deformity  Skin: no rash or lesions.  GU: no dysuria, change in color of urine, no urgency or frequency.  No flank pain, no hematuria   MS:  No joint pain or swelling.  No decreased range of motion.  No back pain.  Psych:  No change in mood or affect. No  depression or anxiety.  No memory loss.   Vital Signs BP 120/77 (BP Location: Left Arm, Patient Position: Sitting, Cuff Size: Normal)   Pulse (!) 108   Ht 5\' 4"  (1.626 m)   Wt 168 lb 9.6 oz (76.5 kg)   SpO2 100%   BMI 28.94 kg/m    Physical Exam:  General- No distress,  A&Ox3, appropriate, wearing a scarf as she has lost her hair due to chemo ENT: No sinus tenderness, TM clear, pale nasal mucosa, no oral exudate,no post nasal drip, no LAN Cardiac: S1, S2, regular rate and rhythm, no murmur Chest: No wheeze/ rales/ dullness; no accessory muscle use, no nasal flaring, no sternal retractions, rhonchi bilaterally, diminished per bases Abd.: Soft Non-tender, ND, BS +, Body mass index is 28.94 kg/m.  Ext: No clubbing cyanosis, edema, no obvious deformities Neuro:  normal strength, MAE x 4, A&O x 3, Appropriate Skin: No rashes, warm and dry, no obvious lesions  Psych: normal mood and behavior   Assessment/Plan Lung Cancer Undergoing chemotherapy, experiencing difficulty with port access for treatment. Next appointment scheduled for 09/16/2023, but port needs to be replaced before treatment can continue. -Continue current cancer treatment plan. -Address port replacement urgently to avoid delay in treatment.  COPD Patient reports improvement in shortness of breath with use of Trelegy, but has been inconsistent with use due to insurance issues. Currently using Symbicort and Spiriva, but unsure of how to use Spiriva. -Continue Symbicort 2 puffs twice daily and Spiriva 2 puffs once daily. -Provide patient with instructions on how to use Spiriva. -Consider spacer for Symbicort due to patient's difficulty with hand strength. -Consider providing samples of Trelegy if available and approved by insurance.  General Health Maintenance -Ensure patient understands the importance of consistent use of maintenance inhalers regardless of symptom presence. -Consider prescribing Albuterol for use as  needed during periods of increased shortness of breath. -Follow-up after next chemotherapy treatment to assess respiratory status.  I spent 25 minutes dedicated to the care of this patient on the date of this encounter to include pre-visit review of records, face-to-face time with the patient  discussing conditions above, post visit ordering of testing, clinical documentation with the electronic health record, making appropriate referrals as documented, and communicating necessary information to the patient's healthcare team.   Bevelyn Ngo, NP 09/07/2023  1:28 PM

## 2023-09-08 NOTE — Progress Notes (Signed)
 Enrolled [place] into [employer].

## 2023-09-11 ENCOUNTER — Encounter: Payer: Self-pay | Admitting: Oncology

## 2023-09-15 NOTE — Progress Notes (Unsigned)
 [facility]  [address] [place],  [place]  [address] [phone]  Clinic Day: 08/26/2023  Referring physician: Norm Salt, PA   HISTORY OF PRESENT ILLNESS:  The patient is a 63 y.o. female with extensive stage small cell lung cancer, which includes spread of disease to her liver.  She comes in today to be evaluated before heading into her 3rd cycle of carboplatin/etoposide/atezolizumab.  The patient claims to have tolerated her 2nd cycle of this regimen fairly well.  She has had a moderate degree of nausea to where she requests an additional antiemetic for prn use.  She does claim she breathes better.  Overall, she is very emotionally distraught due to her diagnosis, prognosis, and hair loss.  She is scheduled to see social work today to discuss her emotional lability.    PHYSICAL EXAM:  There were no vitals taken for this visit. Wt Readings from Last 3 Encounters:  09/07/23 168 lb 9.6 oz (76.5 kg)  08/28/23 167 lb (75.8 kg)  08/26/23 166 lb 9.6 oz (75.6 kg)   There is no height or weight on file to calculate BMI. Performance status (ECOG): 1 - Symptomatic but completely ambulatory Physical Exam Constitutional:      Appearance: Normal appearance. She is not ill-appearing.     Comments: An anxious appearing woman who is ambulating with a cane   HENT:     Mouth/Throat:     Mouth: Mucous membranes are moist.     Pharynx: Oropharynx is clear. No oropharyngeal exudate or posterior oropharyngeal erythema.  Cardiovascular:     Rate and Rhythm: Normal rate and regular rhythm.     Heart sounds: No murmur heard.    No friction rub. No gallop.  Pulmonary:     Effort: Pulmonary effort is normal. No respiratory distress.     Breath sounds: No decreased breath sounds, wheezing, rhonchi or rales.  Abdominal:     General: Bowel sounds are normal. There is no distension.     Palpations: Abdomen is soft. There is no mass.     Tenderness: There is no  abdominal tenderness.  Musculoskeletal:        General: No swelling.     Right lower leg: No edema.     Left lower leg: No edema.  Lymphadenopathy:     Cervical: No cervical adenopathy.     Upper Body:     Right upper body: No supraclavicular or axillary adenopathy.     Left upper body: No supraclavicular or axillary adenopathy.     Lower Body: No right inguinal adenopathy. No left inguinal adenopathy.  Skin:    General: Skin is warm.     Coloration: Skin is not jaundiced.     Findings: No lesion or rash.  Neurological:     General: No focal deficit present.     Mental Status: She is alert and oriented to person, place, and time. Mental status is at baseline.  Psychiatric:        Mood and Affect: Mood normal.        Behavior: Behavior normal.        Thought Content: Thought content normal.   SCANS: Her PET scans revealed the following: IMPRESSION: 1. Stage IV primary bronchogenic carcinoma, likely small cell lung cancer. Hypermetabolic dominant AP window nodal mass with adenopathy throughout the chest, upper abdomen and pelvis as well as hepatic and osseous metastases. 2. Prominent right vocal cord hypermetabolism, without a clear CT correlate. Finding  is quite asymmetric for physiologic activity. Consider CT neck with contrast in further evaluation, as malignancy cannot be excluded. 3. Right IJ Port-A-Cath is coiled in the right internal jugular vein, terminating in the low internal jugular vein, new from chest radiograph 07/17/2023. These results will be called to the ordering clinician or representative by the Radiologist Assistant, and communication documented in the PACS or [employer]. 4. Bilateral adrenal adenomas. 5. Aortic atherosclerosis (ICD10-I70.0).  PATHOLOGY:  His left lung biopsy in December 2024 revealed the following: A. LUNG, LEFT MASS, BIOPSY:  - Small cell carcinoma  LABS:      Latest Ref Rng & Units 08/26/2023    8:39 AM 08/05/2023    9:14 AM  07/09/2023    2:33 PM  CBC  WBC 4.0 - 10.5 K/uL 8.1  8.8  11.0   Hemoglobin 12.0 - 15.0 g/dL 16.1  09.6  04.5   Hematocrit 36.0 - 46.0 % 37.9  44.2  47.3   Platelets 150 - 400 K/uL 281  233  199       Latest Ref Rng & Units 08/26/2023    8:39 AM 08/05/2023    9:14 AM 07/09/2023    2:33 PM  CMP  Glucose 70 - 99 mg/dL 73  92  91   BUN 8 - 23 mg/dL 10  6  11    Creatinine 0.44 - 1.00 mg/dL 4.09  8.11  9.14   Sodium 135 - 145 mmol/L 139  133  136   Potassium 3.5 - 5.1 mmol/L 4.3  4.0  4.4   Chloride 98 - 111 mmol/L 104  96  100   CO2 22 - 32 mmol/L 26  26  25    Calcium 8.9 - 10.3 mg/dL 9.6  9.9  9.6   Total Protein 6.5 - 8.1 g/dL 7.2  7.4  7.3   Total Bilirubin 0.0 - 1.2 mg/dL <7.8  0.3  0.3   Alkaline Phos 38 - 126 U/L 129  123  108   AST 15 - 41 U/L 14  28  24    ALT 0 - 44 U/L 12  18  29     ASSESSMENT & PLAN:  A 63 y.o. female with extensive stage small cell lung cancer, including spread of disease to her liver and bones.  She will proceed with her 2nd cycle of carboplatin/etoposide/atezolizumab today.  Although she is emotionally distraught today, she clinically is doing much better after her 1st cycle of treatment.  I will provide her with ondansetron, which she can take with her compazine for nausea.  I will prescribe her lortab to help with the bone pain she gets from her Neulasta injections, which she receives after each cycle of treatment.  I will also prescribe her a wig for her alopecia.  I will also prescribe her albuterol to help with her intermittent shortness of breath.  Of note, the patient was made aware that her brain MRI came back negative.  Although she is emotionally distraught, I reassured her that her treatments already appear to be working.  I will see this patient back in 3 weeks before she heads into her 3rd cycle of carboplatin/etoposide/atezolizumab.  The patient and her family understand all the plans discussed today and are in agreement with them.  Mason Burleigh Kirby Funk, MD

## 2023-09-16 ENCOUNTER — Inpatient Hospital Stay: Payer: Medicaid Other

## 2023-09-16 ENCOUNTER — Inpatient Hospital Stay (HOSPITAL_BASED_OUTPATIENT_CLINIC_OR_DEPARTMENT_OTHER): Payer: Medicaid Other | Admitting: Oncology

## 2023-09-16 ENCOUNTER — Encounter: Payer: Self-pay | Admitting: Oncology

## 2023-09-16 VITALS — BP 127/86 | HR 96 | Temp 98.9°F | Resp 22 | Ht 64.0 in | Wt 173.8 lb

## 2023-09-16 VITALS — Resp 20

## 2023-09-16 DIAGNOSIS — C3412 Malignant neoplasm of upper lobe, left bronchus or lung: Secondary | ICD-10-CM

## 2023-09-16 DIAGNOSIS — Z5112 Encounter for antineoplastic immunotherapy: Secondary | ICD-10-CM | POA: Diagnosis not present

## 2023-09-16 LAB — CMP (CANCER CENTER ONLY)
ALT: 14 U/L (ref 0–44)
AST: 20 U/L (ref 15–41)
Albumin: 4 g/dL (ref 3.5–5.0)
Alkaline Phosphatase: 117 U/L (ref 38–126)
Anion gap: 10 (ref 5–15)
BUN: 8 mg/dL (ref 8–23)
CO2: 27 mmol/L (ref 22–32)
Calcium: 9.1 mg/dL (ref 8.9–10.3)
Chloride: 104 mmol/L (ref 98–111)
Creatinine: 0.73 mg/dL (ref 0.44–1.00)
GFR, Estimated: >60 mL/min (ref >60)
Glucose, Bld: 119 mg/dL — ABNORMAL HIGH (ref 70–99)
Potassium: 4 mmol/L (ref 3.5–5.1)
Sodium: 140 mmol/L (ref 135–145)
Total Bilirubin: <0.2 mg/dL (ref 0.0–1.2)
Total Protein: 6.8 g/dL (ref 6.5–8.1)

## 2023-09-16 LAB — CBC WITH DIFFERENTIAL (CANCER CENTER ONLY)
Abs Immature Granulocytes: 0.17 10*3/uL — ABNORMAL HIGH (ref 0.00–0.07)
Basophils Absolute: 0.1 10*3/uL (ref 0.0–0.1)
Basophils Relative: 1 %
Eosinophils Absolute: 0 10*3/uL (ref 0.0–0.5)
Eosinophils Relative: 0 %
HCT: 34.4 % — ABNORMAL LOW (ref 36.0–46.0)
Hemoglobin: 11.3 g/dL — ABNORMAL LOW (ref 12.0–15.0)
Immature Granulocytes: 2 %
Lymphocytes Relative: 17 %
Lymphs Abs: 1.5 10*3/uL (ref 0.7–4.0)
MCH: 30.7 pg (ref 26.0–34.0)
MCHC: 32.8 g/dL (ref 30.0–36.0)
MCV: 93.5 fL (ref 80.0–100.0)
Monocytes Absolute: 0.5 10*3/uL (ref 0.1–1.0)
Monocytes Relative: 5 %
Neutro Abs: 6.9 10*3/uL (ref 1.7–7.7)
Neutrophils Relative %: 75 %
Platelet Count: 182 10*3/uL (ref 150–400)
RBC: 3.68 MIL/uL — ABNORMAL LOW (ref 3.87–5.11)
RDW: 16.7 % — ABNORMAL HIGH (ref 11.5–15.5)
WBC Count: 9.2 10*3/uL (ref 4.0–10.5)
nRBC: 0 % (ref 0.0–0.2)
nRBC: 0 /100{WBCs}

## 2023-09-16 LAB — TSH: TSH: 2.626 u[IU]/mL (ref 0.350–4.500)

## 2023-09-16 MED ORDER — SODIUM CHLORIDE 0.9 % IV SOLN
98.0000 mg/m2 | Freq: Once | INTRAVENOUS | Status: AC
  Administered 2023-09-16: 180 mg via INTRAVENOUS
  Filled 2023-09-16: qty 9

## 2023-09-16 MED ORDER — SODIUM CHLORIDE 0.9 % IV SOLN: INTRAVENOUS | Status: DC

## 2023-09-16 MED ORDER — PALONOSETRON HCL INJECTION 0.25 MG/5ML
0.2500 mg | Freq: Once | INTRAVENOUS | Status: AC
  Administered 2023-09-16: 0.25 mg via INTRAVENOUS
  Filled 2023-09-16: qty 5

## 2023-09-16 MED ORDER — HEPARIN SOD (PORK) LOCK FLUSH 100 UNIT/ML IV SOLN: 500.0000 [IU] | Freq: Once | INTRAVENOUS | Status: DC | PRN

## 2023-09-16 MED ORDER — SODIUM CHLORIDE 0.9 % IV SOLN
150.0000 mg | Freq: Once | INTRAVENOUS | Status: AC
  Administered 2023-09-16: 150 mg via INTRAVENOUS
  Filled 2023-09-16: qty 150

## 2023-09-16 MED ORDER — CARBOPLATIN CHEMO INJECTION 600 MG/60ML
565.5000 mg | Freq: Once | INTRAVENOUS | Status: AC
  Administered 2023-09-16: 570 mg via INTRAVENOUS
  Filled 2023-09-16: qty 57

## 2023-09-16 MED ORDER — SODIUM CHLORIDE 0.9 % IV SOLN
1200.0000 mg | Freq: Once | INTRAVENOUS | Status: AC
  Administered 2023-09-16: 1200 mg via INTRAVENOUS
  Filled 2023-09-16: qty 20

## 2023-09-16 MED ORDER — DEXAMETHASONE SODIUM PHOSPHATE 10 MG/ML IJ SOLN
10.0000 mg | Freq: Once | INTRAMUSCULAR | Status: AC
  Administered 2023-09-16: 10 mg via INTRAVENOUS
  Filled 2023-09-16: qty 1

## 2023-09-16 MED ORDER — SODIUM CHLORIDE 0.9% FLUSH: 10.0000 mL | INTRAVENOUS | Status: DC | PRN

## 2023-09-16 NOTE — Patient Instructions (Signed)
 Carboplatin Injection What is this medication? CARBOPLATIN (KAR boe pla tin) treats some types of cancer. It works by slowing down the growth of cancer cells. This medicine may be used for other purposes; ask your health care provider or pharmacist if you have questions. COMMON BRAND NAME(S): Paraplatin What should I tell my care team before I take this medication? They need to know if you have any of these conditions: Blood disorders Hearing problems Kidney disease Recent or ongoing radiation therapy An unusual or allergic reaction to carboplatin, cisplatin, other medications, foods, dyes, or preservatives Pregnant or trying to get pregnant Breast-feeding How should I use this medication? This medication is injected into a vein. It is given by your care team in a hospital or clinic setting. Talk to your care team about the use of this medication in children. Special care may be needed. Overdosage: If you think you have taken too much of this medicine contact a poison control center or emergency room at once. NOTE: This medicine is only for you. Do not share this medicine with others. What if I miss a dose? Keep appointments for follow-up doses. It is important not to miss your dose. Call your care team if you are unable to keep an appointment. What may interact with this medication? Medications for seizures Some antibiotics, such as amikacin, gentamicin, neomycin, streptomycin, tobramycin Vaccines This list may not describe all possible interactions. Give your health care provider a list of all the medicines, herbs, non-prescription drugs, or dietary supplements you use. Also tell them if you smoke, drink alcohol, or use illegal drugs. Some items may interact with your medicine. What should I watch for while using this medication? Your condition will be monitored carefully while you are receiving this medication. You may need blood work while taking this medication. This medication may  make you feel generally unwell. This is not uncommon, as chemotherapy can affect healthy cells as well as cancer cells. Report any side effects. Continue your course of treatment even though you feel ill unless your care team tells you to stop. In some cases, you may be given additional medications to help with side effects. Follow all directions for their use. This medication may increase your risk of getting an infection. Call your care team for advice if you get a fever, chills, sore throat, or other symptoms of a cold or flu. Do not treat yourself. Try to avoid being around people who are sick. Avoid taking medications that contain aspirin, acetaminophen, ibuprofen, naproxen, or ketoprofen unless instructed by your care team. These medications may hide a fever. Be careful brushing or flossing your teeth or using a toothpick because you may get an infection or bleed more easily. If you have any dental work done, tell your dentist you are receiving this medication. Talk to your care team if you wish to become pregnant or think you might be pregnant. This medication can cause serious birth defects. Talk to your care team about effective forms of contraception. Do not breast-feed while taking this medication. What side effects may I notice from receiving this medication? Side effects that you should report to your care team as soon as possible: Allergic reactions--skin rash, itching, hives, swelling of the face, lips, tongue, or throat Infection--fever, chills, cough, sore throat, wounds that don't heal, pain or trouble when passing urine, general feeling of discomfort or being unwell Low red blood cell level--unusual weakness or fatigue, dizziness, headache, trouble breathing Pain, tingling, or numbness in the hands or  feet, muscle weakness, change in vision, confusion or trouble speaking, loss of balance or coordination, trouble walking, seizures Unusual bruising or bleeding Side effects that usually  do not require medical attention (report to your care team if they continue or are bothersome): Hair loss Nausea Unusual weakness or fatigue Vomiting This list may not describe all possible side effects. Call your doctor for medical advice about side effects. You may report side effects to FDA at 1-800-FDA-1088. Where should I keep my medication? This medication is given in a hospital or clinic. It will not be stored at home. NOTE: This sheet is a summary. It may not cover all possible information. If you have questions about this medicine, talk to your doctor, pharmacist, or health care provider.  2024 Elsevier/Gold Standard (2021-10-29 00:00:00)Etoposide Injection What is this medication? ETOPOSIDE (e toe POE side) treats some types of cancer. It works by slowing down the growth of cancer cells. This medicine may be used for other purposes; ask your health care provider or pharmacist if you have questions. COMMON BRAND NAME(S): Etopophos, Toposar, VePesid What should I tell my care team before I take this medication? They need to know if you have any of these conditions: Infection Kidney disease Liver disease Low blood counts, such as low white cell, platelet, red cell counts An unusual or allergic reaction to etoposide, other medications, foods, dyes, or preservatives If you or your partner are pregnant or trying to get pregnant Breastfeeding How should I use this medication? This medication is injected into a vein. It is given by your care team in a hospital or clinic setting. Talk to your care team about the use of this medication in children. Special care may be needed. Overdosage: If you think you have taken too much of this medicine contact a poison control center or emergency room at once. NOTE: This medicine is only for you. Do not share this medicine with others. What if I miss a dose? Keep appointments for follow-up doses. It is important not to miss your dose. Call your  care team if you are unable to keep an appointment. What may interact with this medication? Warfarin This list may not describe all possible interactions. Give your health care provider a list of all the medicines, herbs, non-prescription drugs, or dietary supplements you use. Also tell them if you smoke, drink alcohol, or use illegal drugs. Some items may interact with your medicine. What should I watch for while using this medication? Your condition will be monitored carefully while you are receiving this medication. This medication may make you feel generally unwell. This is not uncommon as chemotherapy can affect healthy cells as well as cancer cells. Report any side effects. Continue your course of treatment even though you feel ill unless your care team tells you to stop. This medication can cause serious side effects. To reduce the risk, your care team may give you other medications to take before receiving this one. Be sure to follow the directions from your care team. This medication may increase your risk of getting an infection. Call your care team for advice if you get a fever, chills, sore throat, or other symptoms of a cold or flu. Do not treat yourself. Try to avoid being around people who are sick. This medication may increase your risk to bruise or bleed. Call your care team if you notice any unusual bleeding. Talk to your care team about your risk of cancer. You may be more at risk for certain  types of cancers if you take this medication. Talk to your care team if you may be pregnant. Serious birth defects can occur if you take this medication during pregnancy and for 6 months after the last dose. You will need a negative pregnancy test before starting this medication. Contraception is recommended while taking this medication and for 6 months after the last dose. Your care team can help you find the option that works for you. If your partner can get pregnant, use a condom during sex  while taking this medication and for 4 months after the last dose. Do not breastfeed while taking this medication. This medication may cause infertility. Talk to your care team if you are concerned about your fertility. What side effects may I notice from receiving this medication? Side effects that you should report to your care team as soon as possible: Allergic reactions--skin rash, itching, hives, swelling of the face, lips, tongue, or throat Infection--fever, chills, cough, sore throat, wounds that don't heal, pain or trouble when passing urine, general feeling of discomfort or being unwell Low red blood cell level--unusual weakness or fatigue, dizziness, headache, trouble breathing Unusual bruising or bleeding Side effects that usually do not require medical attention (report to your care team if they continue or are bothersome): Diarrhea Fatigue Hair loss Loss of appetite Nausea Vomiting This list may not describe all possible side effects. Call your doctor for medical advice about side effects. You may report side effects to FDA at 1-800-FDA-1088. Where should I keep my medication? This medication is given in a hospital or clinic. It will not be stored at home. NOTE: This sheet is a summary. It may not cover all possible information. If you have questions about this medicine, talk to your doctor, pharmacist, or health care provider.  2024 Elsevier/Gold Standard (2021-11-28 00:00:00)

## 2023-09-17 ENCOUNTER — Inpatient Hospital Stay: Payer: Medicaid Other

## 2023-09-17 ENCOUNTER — Other Ambulatory Visit: Payer: Self-pay

## 2023-09-17 VITALS — BP 124/69 | HR 94 | Temp 98.0°F | Resp 18 | Wt 171.0 lb

## 2023-09-17 DIAGNOSIS — C3412 Malignant neoplasm of upper lobe, left bronchus or lung: Secondary | ICD-10-CM

## 2023-09-17 DIAGNOSIS — Z5112 Encounter for antineoplastic immunotherapy: Secondary | ICD-10-CM | POA: Diagnosis not present

## 2023-09-17 LAB — T4: T4, Total: 7.1 ug/dL (ref 4.5–12.0)

## 2023-09-17 MED ORDER — SODIUM CHLORIDE 0.9 % IV SOLN
98.0000 mg/m2 | Freq: Once | INTRAVENOUS | Status: AC
  Administered 2023-09-17: 180 mg via INTRAVENOUS
  Filled 2023-09-17: qty 9

## 2023-09-17 MED ORDER — HEPARIN SOD (PORK) LOCK FLUSH 100 UNIT/ML IV SOLN: 500.0000 [IU] | Freq: Once | INTRAVENOUS | Status: DC | PRN

## 2023-09-17 MED ORDER — SODIUM CHLORIDE 0.9% FLUSH: 10.0000 mL | INTRAVENOUS | Status: DC | PRN

## 2023-09-17 MED ORDER — SODIUM CHLORIDE 0.9 % IV SOLN: INTRAVENOUS | Status: DC

## 2023-09-17 MED ORDER — DEXAMETHASONE SODIUM PHOSPHATE 10 MG/ML IJ SOLN
10.0000 mg | Freq: Once | INTRAMUSCULAR | Status: AC
  Administered 2023-09-17: 10 mg via INTRAVENOUS
  Filled 2023-09-17: qty 1

## 2023-09-17 NOTE — Patient Instructions (Signed)
Etoposide Injection What is this medication? ETOPOSIDE (e toe POE side) treats some types of cancer. It works by slowing down the growth of cancer cells. This medicine may be used for other purposes; ask your health care provider or pharmacist if you have questions. COMMON BRAND NAME(S): Etopophos, Toposar, VePesid What should I tell my care team before I take this medication? They need to know if you have any of these conditions: Infection Kidney disease Liver disease Low blood counts, such as low white cell, platelet, red cell counts An unusual or allergic reaction to etoposide, other medications, foods, dyes, or preservatives If you or your partner are pregnant or trying to get pregnant Breastfeeding How should I use this medication? This medication is injected into a vein. It is given by your care team in a hospital or clinic setting. Talk to your care team about the use of this medication in children. Special care may be needed. Overdosage: If you think you have taken too much of this medicine contact a poison control center or emergency room at once. NOTE: This medicine is only for you. Do not share this medicine with others. What if I miss a dose? Keep appointments for follow-up doses. It is important not to miss your dose. Call your care team if you are unable to keep an appointment. What may interact with this medication? Warfarin This list may not describe all possible interactions. Give your health care provider a list of all the medicines, herbs, non-prescription drugs, or dietary supplements you use. Also tell them if you smoke, drink alcohol, or use illegal drugs. Some items may interact with your medicine. What should I watch for while using this medication? Your condition will be monitored carefully while you are receiving this medication. This medication may make you feel generally unwell. This is not uncommon as chemotherapy can affect healthy cells as well as cancer  cells. Report any side effects. Continue your course of treatment even though you feel ill unless your care team tells you to stop. This medication can cause serious side effects. To reduce the risk, your care team may give you other medications to take before receiving this one. Be sure to follow the directions from your care team. This medication may increase your risk of getting an infection. Call your care team for advice if you get a fever, chills, sore throat, or other symptoms of a cold or flu. Do not treat yourself. Try to avoid being around people who are sick. This medication may increase your risk to bruise or bleed. Call your care team if you notice any unusual bleeding. Talk to your care team about your risk of cancer. You may be more at risk for certain types of cancers if you take this medication. Talk to your care team if you may be pregnant. Serious birth defects can occur if you take this medication during pregnancy and for 6 months after the last dose. You will need a negative pregnancy test before starting this medication. Contraception is recommended while taking this medication and for 6 months after the last dose. Your care team can help you find the option that works for you. If your partner can get pregnant, use a condom during sex while taking this medication and for 4 months after the last dose. Do not breastfeed while taking this medication. This medication may cause infertility. Talk to your care team if you are concerned about your fertility. What side effects may I notice from receiving this medication?  Side effects that you should report to your care team as soon as possible: Allergic reactions--skin rash, itching, hives, swelling of the face, lips, tongue, or throat Infection--fever, chills, cough, sore throat, wounds that don't heal, pain or trouble when passing urine, general feeling of discomfort or being unwell Low red blood cell level--unusual weakness or fatigue,  dizziness, headache, trouble breathing Unusual bruising or bleeding Side effects that usually do not require medical attention (report to your care team if they continue or are bothersome): Diarrhea Fatigue Hair loss Loss of appetite Nausea Vomiting This list may not describe all possible side effects. Call your doctor for medical advice about side effects. You may report side effects to FDA at 1-800-FDA-1088. Where should I keep my medication? This medication is given in a hospital or clinic. It will not be stored at home. NOTE: This sheet is a summary. It may not cover all possible information. If you have questions about this medicine, talk to your doctor, pharmacist, or health care provider.  2024 Elsevier/Gold Standard (2021-11-28 00:00:00)

## 2023-09-18 ENCOUNTER — Inpatient Hospital Stay (HOSPITAL_BASED_OUTPATIENT_CLINIC_OR_DEPARTMENT_OTHER): Payer: Medicaid Other

## 2023-09-18 VITALS — BP 130/67 | HR 88 | Temp 97.6°F | Resp 18 | Wt 170.0 lb

## 2023-09-18 DIAGNOSIS — C3412 Malignant neoplasm of upper lobe, left bronchus or lung: Secondary | ICD-10-CM

## 2023-09-18 DIAGNOSIS — Z5112 Encounter for antineoplastic immunotherapy: Secondary | ICD-10-CM | POA: Diagnosis not present

## 2023-09-18 MED ORDER — SODIUM CHLORIDE 0.9% FLUSH: 10.0000 mL | INTRAVENOUS | Status: DC | PRN

## 2023-09-18 MED ORDER — SODIUM CHLORIDE 0.9 % IV SOLN: INTRAVENOUS | Status: DC

## 2023-09-18 MED ORDER — SODIUM CHLORIDE 0.9 % IV SOLN
98.0000 mg/m2 | Freq: Once | INTRAVENOUS | Status: AC
  Administered 2023-09-18: 180 mg via INTRAVENOUS
  Filled 2023-09-18: qty 9

## 2023-09-18 MED ORDER — HEPARIN SOD (PORK) LOCK FLUSH 100 UNIT/ML IV SOLN: 500.0000 [IU] | Freq: Once | INTRAVENOUS | Status: DC | PRN

## 2023-09-18 MED ORDER — DEXAMETHASONE SODIUM PHOSPHATE 10 MG/ML IJ SOLN
10.0000 mg | Freq: Once | INTRAMUSCULAR | Status: AC
  Administered 2023-09-18: 10 mg via INTRAVENOUS
  Filled 2023-09-18: qty 1

## 2023-09-18 NOTE — Patient Instructions (Signed)
Etoposide Injection What is this medication? ETOPOSIDE (e toe POE side) treats some types of cancer. It works by slowing down the growth of cancer cells. This medicine may be used for other purposes; ask your health care provider or pharmacist if you have questions. COMMON BRAND NAME(S): Etopophos, Toposar, VePesid What should I tell my care team before I take this medication? They need to know if you have any of these conditions: Infection Kidney disease Liver disease Low blood counts, such as low white cell, platelet, red cell counts An unusual or allergic reaction to etoposide, other medications, foods, dyes, or preservatives If you or your partner are pregnant or trying to get pregnant Breastfeeding How should I use this medication? This medication is injected into a vein. It is given by your care team in a hospital or clinic setting. Talk to your care team about the use of this medication in children. Special care may be needed. Overdosage: If you think you have taken too much of this medicine contact a poison control center or emergency room at once. NOTE: This medicine is only for you. Do not share this medicine with others. What if I miss a dose? Keep appointments for follow-up doses. It is important not to miss your dose. Call your care team if you are unable to keep an appointment. What may interact with this medication? Warfarin This list may not describe all possible interactions. Give your health care provider a list of all the medicines, herbs, non-prescription drugs, or dietary supplements you use. Also tell them if you smoke, drink alcohol, or use illegal drugs. Some items may interact with your medicine. What should I watch for while using this medication? Your condition will be monitored carefully while you are receiving this medication. This medication may make you feel generally unwell. This is not uncommon as chemotherapy can affect healthy cells as well as cancer  cells. Report any side effects. Continue your course of treatment even though you feel ill unless your care team tells you to stop. This medication can cause serious side effects. To reduce the risk, your care team may give you other medications to take before receiving this one. Be sure to follow the directions from your care team. This medication may increase your risk of getting an infection. Call your care team for advice if you get a fever, chills, sore throat, or other symptoms of a cold or flu. Do not treat yourself. Try to avoid being around people who are sick. This medication may increase your risk to bruise or bleed. Call your care team if you notice any unusual bleeding. Talk to your care team about your risk of cancer. You may be more at risk for certain types of cancers if you take this medication. Talk to your care team if you may be pregnant. Serious birth defects can occur if you take this medication during pregnancy and for 6 months after the last dose. You will need a negative pregnancy test before starting this medication. Contraception is recommended while taking this medication and for 6 months after the last dose. Your care team can help you find the option that works for you. If your partner can get pregnant, use a condom during sex while taking this medication and for 4 months after the last dose. Do not breastfeed while taking this medication. This medication may cause infertility. Talk to your care team if you are concerned about your fertility. What side effects may I notice from receiving this medication?  Side effects that you should report to your care team as soon as possible: Allergic reactions--skin rash, itching, hives, swelling of the face, lips, tongue, or throat Infection--fever, chills, cough, sore throat, wounds that don't heal, pain or trouble when passing urine, general feeling of discomfort or being unwell Low red blood cell level--unusual weakness or fatigue,  dizziness, headache, trouble breathing Unusual bruising or bleeding Side effects that usually do not require medical attention (report to your care team if they continue or are bothersome): Diarrhea Fatigue Hair loss Loss of appetite Nausea Vomiting This list may not describe all possible side effects. Call your doctor for medical advice about side effects. You may report side effects to FDA at 1-800-FDA-1088. Where should I keep my medication? This medication is given in a hospital or clinic. It will not be stored at home. NOTE: This sheet is a summary. It may not cover all possible information. If you have questions about this medicine, talk to your doctor, pharmacist, or health care provider.  2024 Elsevier/Gold Standard (2021-11-28 00:00:00)

## 2023-09-21 ENCOUNTER — Encounter: Payer: Self-pay | Admitting: Oncology

## 2023-09-21 ENCOUNTER — Inpatient Hospital Stay: Payer: Medicaid Other | Attending: Oncology

## 2023-09-21 ENCOUNTER — Other Ambulatory Visit: Payer: Self-pay | Admitting: Pharmacist

## 2023-09-21 ENCOUNTER — Other Ambulatory Visit

## 2023-09-21 VITALS — BP 154/76 | HR 102 | Resp 18 | Ht 64.0 in

## 2023-09-21 DIAGNOSIS — Z5112 Encounter for antineoplastic immunotherapy: Secondary | ICD-10-CM | POA: Insufficient documentation

## 2023-09-21 DIAGNOSIS — C3492 Malignant neoplasm of unspecified part of left bronchus or lung: Secondary | ICD-10-CM | POA: Insufficient documentation

## 2023-09-21 DIAGNOSIS — C7951 Secondary malignant neoplasm of bone: Secondary | ICD-10-CM | POA: Insufficient documentation

## 2023-09-21 DIAGNOSIS — Z5111 Encounter for antineoplastic chemotherapy: Secondary | ICD-10-CM | POA: Diagnosis present

## 2023-09-21 DIAGNOSIS — C787 Secondary malignant neoplasm of liver and intrahepatic bile duct: Secondary | ICD-10-CM | POA: Diagnosis not present

## 2023-09-21 DIAGNOSIS — Z79620 Long term (current) use of immunosuppressive biologic: Secondary | ICD-10-CM | POA: Diagnosis not present

## 2023-09-21 DIAGNOSIS — E860 Dehydration: Secondary | ICD-10-CM

## 2023-09-21 DIAGNOSIS — C3412 Malignant neoplasm of upper lobe, left bronchus or lung: Secondary | ICD-10-CM

## 2023-09-21 DIAGNOSIS — Z5189 Encounter for other specified aftercare: Secondary | ICD-10-CM | POA: Diagnosis not present

## 2023-09-21 LAB — CBC WITH DIFFERENTIAL (CANCER CENTER ONLY)
Abs Immature Granulocytes: 0 10*3/uL (ref 0.00–0.07)
Basophils Absolute: 0 10*3/uL (ref 0.0–0.1)
Basophils Relative: 0 %
Eosinophils Absolute: 0.1 10*3/uL (ref 0.0–0.5)
Eosinophils Relative: 1 %
HCT: 33.3 % — ABNORMAL LOW (ref 36.0–46.0)
Hemoglobin: 11 g/dL — ABNORMAL LOW (ref 12.0–15.0)
Immature Granulocytes: 0 %
Lymphocytes Relative: 17 %
Lymphs Abs: 1.1 10*3/uL (ref 0.7–4.0)
MCH: 30.5 pg (ref 26.0–34.0)
MCHC: 33 g/dL (ref 30.0–36.0)
MCV: 92.2 fL (ref 80.0–100.0)
Monocytes Absolute: 0 10*3/uL — ABNORMAL LOW (ref 0.1–1.0)
Monocytes Relative: 0 %
Neutro Abs: 5.2 10*3/uL (ref 1.7–7.7)
Neutrophils Relative %: 82 %
Platelet Count: 305 10*3/uL (ref 150–400)
RBC: 3.61 MIL/uL — ABNORMAL LOW (ref 3.87–5.11)
RDW: 16.3 % — ABNORMAL HIGH (ref 11.5–15.5)
Smear Review: NORMAL
WBC Count: 6.4 10*3/uL (ref 4.0–10.5)
nRBC: 0 % (ref 0.0–0.2)
nRBC: 0 /100{WBCs}

## 2023-09-21 LAB — COMPREHENSIVE METABOLIC PANEL
ALT: 26 U/L (ref 0–44)
AST: 25 U/L (ref 15–41)
Albumin: 3.9 g/dL (ref 3.5–5.0)
Alkaline Phosphatase: 94 U/L (ref 38–126)
Anion gap: 10 (ref 5–15)
BUN: 15 mg/dL (ref 8–23)
CO2: 26 mmol/L (ref 22–32)
Calcium: 9.1 mg/dL (ref 8.9–10.3)
Chloride: 102 mmol/L (ref 98–111)
Creatinine, Ser: 0.58 mg/dL (ref 0.44–1.00)
GFR, Estimated: >60 mL/min (ref >60)
Glucose, Bld: 88 mg/dL (ref 70–99)
Potassium: 4.1 mmol/L (ref 3.5–5.1)
Sodium: 138 mmol/L (ref 135–145)
Total Bilirubin: 0.8 mg/dL (ref 0.0–1.2)
Total Protein: 6.5 g/dL (ref 6.5–8.1)

## 2023-09-21 LAB — MAGNESIUM: Magnesium: 2 mg/dL (ref 1.7–2.4)

## 2023-09-21 MED ORDER — HEPARIN SOD (PORK) LOCK FLUSH 100 UNIT/ML IV SOLN
500.0000 [IU] | Freq: Once | INTRAVENOUS | Status: AC
  Administered 2023-09-21: 500 [IU] via INTRAVENOUS

## 2023-09-21 MED ORDER — ONDANSETRON HCL 4 MG/2ML IJ SOLN
8.0000 mg | Freq: Once | INTRAMUSCULAR | Status: AC
  Administered 2023-09-21: 8 mg via INTRAVENOUS
  Filled 2023-09-21: qty 4

## 2023-09-21 MED ORDER — SODIUM CHLORIDE 0.9% FLUSH
10.0000 mL | INTRAVENOUS | Status: DC | PRN
  Administered 2023-09-21: 10 mL via INTRAVENOUS

## 2023-09-21 MED ORDER — PEGFILGRASTIM-CBQV 6 MG/0.6ML ~~LOC~~ SOSY
6.0000 mg | PREFILLED_SYRINGE | Freq: Once | SUBCUTANEOUS | Status: AC
  Administered 2023-09-21: 6 mg via SUBCUTANEOUS
  Filled 2023-09-21: qty 0.6

## 2023-09-21 MED ORDER — SODIUM CHLORIDE 0.9 % IV SOLN: INTRAVENOUS | Status: DC

## 2023-09-21 NOTE — Patient Instructions (Signed)
 Dehydration, Adult Dehydration is a condition in which there is not enough water or other fluids in the body. This happens when a person loses more fluids than they take in. Important organs cannot work right without the right amount of fluids. Any loss of fluids from the body can cause dehydration. Dehydration can be mild, worse, or very bad. It should be treated right away to keep it from getting very bad. What are the causes? Conditions that cause loss of water in the body. They include: Watery poop (diarrhea). Vomiting. Sweating a lot. Fever. Infection. Peeing (urinating) a lot. Not drinking enough fluids. Certain medicines, such as medicines that take extra fluid out of the body (diuretics). Lack of safe drinking water. Not being able to get enough water and food. What increases the risk? Having a long-term (chronic) illness that has not been treated the right way, such as: Diabetes. Heart disease. Kidney disease. Being 25 years of age or older. Having a disability. Living in a place that is high above the ground or sea (high in altitude). The thinner, drier air causes more fluid loss. Doing exercises that put stress on your body for a long time. Being active when in hot places. What are the signs or symptoms? Symptoms of dehydration depend on how bad it is. Mild or worse dehydration Thirst. Dry lips or dry mouth. Feeling dizzy or light-headed. Muscle cramps. Passing little pee or dark pee. Pee may be the color of tea. Headache. Very bad dehydration Changes in skin. Skin may: Be cold to the touch (clammy). Be blotchy or pale. Not go back to normal right after you pinch it and let it go. Little or no tears, pee, or sweat. Fast breathing. Low blood pressure. Weak pulse. Pulse that is more than 100 beats a minute when you are sitting still. Other changes, such as: Feeling very thirsty. Eyes that look hollow (sunken). Cold hands and feet. Being confused. Being very  tired (lethargic) or having trouble waking from sleep. Losing weight. Loss of consciousness. How is this treated? Treatment for this condition depends on how bad your dehydration is. Treatment should start right away. Do not wait until your condition gets very bad. Very bad dehydration is an emergency. You will need to go to a hospital. Mild or worse dehydration can be treated at home. You may be asked to: Drink more fluids. Drink an oral rehydration solution (ORS). This drink gives you the right amount of fluids, salts, and minerals (electrolytes). Very bad dehydration can be treated: With fluids through an IV tube. By correcting low levels of electrolytes in the body. By treating the problem that caused your dehydration. Follow these instructions at home: Oral rehydration solution If told by your doctor, drink an ORS: Make an ORS. Use instructions on the package. Start by drinking small amounts, about  cup (120 mL) every 5-10 minutes. Slowly drink more until you have had the amount that your doctor said to have.  Eating and drinking  Drink enough clear fluid to keep your pee pale yellow. If you were told to drink an ORS, finish the ORS first. Then, start slowly drinking other clear fluids. Drink fluids such as: Water. Do not drink only water. Doing that can make the salt (sodium) level in your body get too low. Water from ice chips you suck on. Fruit juice that you have added water to (diluted). Low-calorie sports drinks. Eat foods that have the right amounts of salts and minerals, such as bananas, oranges, potatoes,  tomatoes, or spinach. Do not drink alcohol. Avoid drinks that have caffeine or sugar. These include:: High-calorie sports drinks. Fruit juice that you did not add water to. Soda. Coffee or energy drinks. Avoid foods that are greasy or have a lot of fat or sugar. General instructions Take over-the-counter and prescription medicines only as told by your doctor. Do  not take sodium tablets. Doing that can make the salt level in your body get too high. Return to your normal activities as told by your doctor. Ask your doctor what activities are safe for you. Keep all follow-up visits. Your doctor may check and change your treatment. Contact a doctor if: You have pain in your belly (abdomen) and the pain: Gets worse. Stays in one place. You have a rash. You have a stiff neck. You get angry or annoyed more easily than normal. You are more tired or have a harder time waking than normal. You feel weak or dizzy. You feel very thirsty. Get help right away if: You have any symptoms of very bad dehydration. You vomit every time you eat or drink. Your vomiting gets worse, does not go away, or you vomit blood or green stuff. You are getting treatment, but symptoms are getting worse. You have a fever. You have a very bad headache. You have: Diarrhea that gets worse or does not go away. Blood in your poop (stool). This may cause poop to look black and tarry. No pee in 6-8 hours. Only a small amount of pee in 6-8 hours, and the pee is very dark. You have trouble breathing. These symptoms may be an emergency. Get help right away. Call 911. Do not wait to see if the symptoms will go away. Do not drive yourself to the hospital. This information is not intended to replace advice given to you by your health care provider. Make sure you discuss any questions you have with your health care provider. Document Revised: 02/03/2022 Document Reviewed: 02/03/2022 Elsevier Patient Education  2024 [employer].

## 2023-09-21 NOTE — Progress Notes (Signed)
 1000 THE PATIENT WAS HERE TODAY FOR A UDENYCA SHOT. SHE STATED THAT SHE FELT AWFUL. GAIT UNSTEADY. SHE STATED THAT SHE HAS NOT HAD MUCH TO DRINK ALL WEEKEND. SHE STATED THAT EVERYTIME SHE TRIED SOMETHING THAT SHE WOULD VOMIT IT UP. SHE HAS DRY HEAVES AT PRESENT. SHE HAS NOT TAKEN ANY NAUSEA MEDICINES TODAY. SHE HAS TRIED SOME LIQUID ANTIACID BUT IT DID NOT WORK. INFORMED DR. Melvyn Neth OF THE ABOVE. WE ARE GIVING HER 1 LITER OF NORMAL SALINE AND SOME ZOFRAN TODAY. THE PATIENT AGREED TO THE ABOVE. SHE STATED THAT SHE COULD NOT STAY TODAY BECAUSE SHE HAS HER DOG WITH HER IN THE CAR. SHE AGREED TO 1 HOUR AND A HALF OF TREATMENT.

## 2023-09-24 ENCOUNTER — Encounter: Payer: Self-pay | Admitting: Hematology and Oncology

## 2023-09-24 ENCOUNTER — Inpatient Hospital Stay: Admitting: Hematology and Oncology

## 2023-09-24 VITALS — BP 128/77 | HR 115 | Temp 98.5°F | Resp 18 | Ht 64.0 in

## 2023-09-24 DIAGNOSIS — C3412 Malignant neoplasm of upper lobe, left bronchus or lung: Secondary | ICD-10-CM | POA: Diagnosis not present

## 2023-09-24 DIAGNOSIS — Z5112 Encounter for antineoplastic immunotherapy: Secondary | ICD-10-CM | POA: Diagnosis not present

## 2023-09-24 NOTE — Progress Notes (Cosign Needed)
 [facility]  [address] [place],  [place]  [address] [phone]  Clinic Day:  09/24/2023  Referring physician: Norm Salt, PA   HISTORY OF PRESENT ILLNESS:  The patient is a 63 y.o. female with extensive stage small cell lung cancer, which includes spread of disease to her liver.  She is added to the schedule today as she walked in reporting feeling strangely.  She feels like her heart is racing.  She has intermittent shortness of breath, which is improved since starting chemotherapy.  She received a 3rd cycle of carboplatin/etoposide/atezolizumab last week and peg filgrastim on Monday.   She was very weak after her chemotherapy and received IV fluids on Monday.  She states she has been drinking plenty of fluids over the last several days and does not feel she needs IV fluids today.  She has had intermittent nausea and states that she has taken ondansetron 4 mg 3 tablets at a time.  I advised her against this, due to potential cardiac effects.  I advised her she can take a maximum of 8 mg 3 times a day.  PHYSICAL EXAM:  Blood pressure 128/77, pulse (!) 115, temperature 98.5 F (36.9 C), temperature source Oral, resp. rate 18, height 5\' 4"  (1.626 m), SpO2 98%. Wt Readings from Last 3 Encounters:  09/18/23 170 lb (77.1 kg)  09/17/23 171 lb (77.6 kg)  09/16/23 173 lb 12.8 oz (78.8 kg)   Body mass index is 29.18 kg/m.  Performance status (ECOG): 1 - Symptomatic but completely ambulatory  Physical Exam Vitals and nursing note reviewed.  Constitutional:      General: She is not in acute distress.    Appearance: Normal appearance. She is not ill-appearing.  HENT:     Head: Normocephalic and atraumatic.     Mouth/Throat:     Mouth: Mucous membranes are moist.     Pharynx: Oropharynx is clear. No oropharyngeal exudate or posterior oropharyngeal erythema.  Eyes:     General: No scleral icterus.    Extraocular Movements: Extraocular movements intact.      Conjunctiva/sclera: Conjunctivae normal.     Pupils: Pupils are equal, round, and reactive to light.  Cardiovascular:     Rate and Rhythm: Regular rhythm. Tachycardia present.     Heart sounds: Normal heart sounds. No murmur heard.    No friction rub. No gallop.  Pulmonary:     Effort: Pulmonary effort is normal.     Breath sounds: Normal breath sounds. No wheezing, rhonchi or rales.  Abdominal:     General: There is no distension.     Palpations: Abdomen is soft. There is no mass.     Tenderness: There is no abdominal tenderness.  Musculoskeletal:        General: Normal range of motion.     Cervical back: Normal range of motion and neck supple. No tenderness.     Right lower leg: No edema.     Left lower leg: No edema.  Lymphadenopathy:     Cervical: No cervical adenopathy.  Skin:    General: Skin is warm and dry.     Coloration: Skin is not jaundiced.     Findings: No rash.  Neurological:     Mental Status: She is alert and oriented to person, place, and time.     Cranial Nerves: No cranial nerve deficit.  Psychiatric:        Mood and Affect: Mood normal.  Behavior: Behavior normal.        Thought Content: Thought content normal.   LABS:      Latest Ref Rng & Units 09/21/2023   12:46 PM 09/16/2023    9:00 AM 08/26/2023    8:39 AM  CBC  WBC 4.0 - 10.5 K/uL 6.4  9.2  8.1   Hemoglobin 12.0 - 15.0 g/dL 16.1  09.6  04.5   Hematocrit 36.0 - 46.0 % 33.3  34.4  37.9   Platelets 150 - 400 K/uL 305  182  281       Latest Ref Rng & Units 09/21/2023   12:46 PM 09/16/2023    9:00 AM 08/26/2023    8:39 AM  CMP  Glucose 70 - 99 mg/dL 88  409  73   BUN 8 - 23 mg/dL 15  8  10    Creatinine 0.44 - 1.00 mg/dL 8.11  9.14  7.82   Sodium 135 - 145 mmol/L 138  140  139   Potassium 3.5 - 5.1 mmol/L 4.1  4.0  4.3   Chloride 98 - 111 mmol/L 102  104  104   CO2 22 - 32 mmol/L 26  27  26    Calcium 8.9 - 10.3 mg/dL 9.1  9.1  9.6   Total Protein 6.5 - 8.1 g/dL 6.5  6.8  7.2   Total  Bilirubin 0.0 - 1.2 mg/dL 0.8  <9.5  <6.2   Alkaline Phos 38 - 126 U/L 94  117  129   AST 15 - 41 U/L 25  20  14    ALT 0 - 44 U/L 26  14  12       Lab Results  Component Value Date   TIBC 301 01/03/2013   FERRITIN 99 01/03/2013   IRONPCTSAT 37 01/03/2013     Review Flowsheet       Latest Ref Rng & Units 01/03/2013  Oncology Labs  Ferritin 10 - 291 ng/mL 99   %SAT 20 - 55 % 37      STUDIES:  IR TRANSCATH RETRIEVAL FB INCL GUIDANCE (MS) Result Date: 09/04/2023 INDICATION: port complication; FB retrieval; foreign body removal Briefly, history of lung cancer s/p surgically-placed RIGHT chest port 07/17/2023, malfunctioning with kinking and attempted removal at referring institution on 07/01/2024. Retained catheter. EXAM: Procedures: 1. RIGHT JUGULAR VENOGRAM 2. INTRAVASCULAR FOREIGN BODY RETRIEVAL COMPARISON:  Chest XR, 08/10/2023. MEDICATIONS: None. ANESTHESIA/SEDATION: Moderate (conscious) sedation was employed during this procedure. A total of Versed 2 mg and Fentanyl 125 mcg was administered intravenously. Moderate Sedation Time: 40 minutes. The patient's level of consciousness and vital signs were monitored continuously by radiology nursing throughout the procedure under my direct supervision. CONTRAST:  10 mL Omnipaque 300 COMPLICATIONS: None immediate TECHNIQUE: Informed written consent was obtained from the patient following explanation of the procedure, risks, benefits and alternatives. A time out was performed prior to the initiation of the procedure. Maximal barrier sterile technique utilized including caps, mask, sterile gowns, sterile gloves, large sterile drape, hand hygiene, and Betadine prep. Under sterile condition and local anesthesia, RIGHT common femoral venous access was performed with ultrasound. An ultrasound image was saved and sent to PACS. Over a guidewire, a 30 mm Ensnare device was advanced into the SVC. The proximal end of the catheter fragment was noted to be  mobile. This was snared, and the fragment was retracted in its entirety out the 6 Fr sheath. Follow-up spot radiograph shows no retained fragments. RIGHT jugular venogram performed through the sheath shows  no intravascular thrombus, stenosis, extravasation, dissection, or retained foreign body. The sheath was removed and hemostasis was obtained with manual compression. A dressing was placed. The patient tolerated the procedure well without immediate post procedural complication. FINDINGS: Retained intravascular catheter fragment was localized in the RIGHT jugular vein. The catheter was snared from a RIGHT femoral and removed in its entirety. IMPRESSION: Successful endovascular retrieval of a retained port catheter fragment from the RIGHT jugular vein. Roanna Banning, MD [facility] Radiology Electronically Signed   By: Roanna Banning M.D.   On: 09/04/2023 19:11   IR US Guide Vasc Access Right Result Date: 09/04/2023 INDICATION: port complication; FB retrieval; foreign body removal Briefly, history of lung cancer s/p surgically-placed RIGHT chest port 07/17/2023, malfunctioning with kinking and attempted removal at referring institution on 07/01/2024. Retained catheter. EXAM: Procedures: 1. RIGHT JUGULAR VENOGRAM 2. INTRAVASCULAR FOREIGN BODY RETRIEVAL COMPARISON:  Chest XR, 08/10/2023. MEDICATIONS: None. ANESTHESIA/SEDATION: Moderate (conscious) sedation was employed during this procedure. A total of Versed 2 mg and Fentanyl 125 mcg was administered intravenously. Moderate Sedation Time: 40 minutes. The patient's level of consciousness and vital signs were monitored continuously by radiology nursing throughout the procedure under my direct supervision. CONTRAST:  10 mL Omnipaque 300 COMPLICATIONS: None immediate TECHNIQUE: Informed written consent was obtained from the patient following explanation of the procedure, risks, benefits and alternatives. A time out was  performed prior to the initiation of the procedure. Maximal barrier sterile technique utilized including caps, mask, sterile gowns, sterile gloves, large sterile drape, hand hygiene, and Betadine prep. Under sterile condition and local anesthesia, RIGHT common femoral venous access was performed with ultrasound. An ultrasound image was saved and sent to PACS. Over a guidewire, a 30 mm Ensnare device was advanced into the SVC. The proximal end of the catheter fragment was noted to be mobile. This was snared, and the fragment was retracted in its entirety out the 6 Fr sheath. Follow-up spot radiograph shows no retained fragments. RIGHT jugular venogram performed through the sheath shows no intravascular thrombus, stenosis, extravasation, dissection, or retained foreign body. The sheath was removed and hemostasis was obtained with manual compression. A dressing was placed. The patient tolerated the procedure well without immediate post procedural complication. FINDINGS: Retained intravascular catheter fragment was localized in the RIGHT jugular vein. The catheter was snared from a RIGHT femoral and removed in its entirety. IMPRESSION: Successful endovascular retrieval of a retained port catheter fragment from the RIGHT jugular vein. Roanna Banning, MD [facility] Radiology Electronically Signed   By: Roanna Banning M.D.   On: 09/04/2023 19:11   IR Veno/Jugular Right Result Date: 09/04/2023 INDICATION: port complication; FB retrieval; foreign body removal Briefly, history of lung cancer s/p surgically-placed RIGHT chest port 07/17/2023, malfunctioning with kinking and attempted removal at referring institution on 07/01/2024. Retained catheter. EXAM: Procedures: 1. RIGHT JUGULAR VENOGRAM 2. INTRAVASCULAR FOREIGN BODY RETRIEVAL COMPARISON:  Chest XR, 08/10/2023. MEDICATIONS: None. ANESTHESIA/SEDATION: Moderate (conscious) sedation was employed during this procedure. A total  of Versed 2 mg and Fentanyl 125 mcg was administered intravenously. Moderate Sedation Time: 40 minutes. The patient's level of consciousness and vital signs were monitored continuously by radiology nursing throughout the procedure under my direct supervision. CONTRAST:  10 mL Omnipaque 300 COMPLICATIONS: None immediate TECHNIQUE: Informed written consent was obtained from the patient following explanation of the procedure, risks, benefits and alternatives. A time out was performed prior to the initiation of the procedure. Maximal barrier sterile technique utilized  including caps, mask, sterile gowns, sterile gloves, large sterile drape, hand hygiene, and Betadine prep. Under sterile condition and local anesthesia, RIGHT common femoral venous access was performed with ultrasound. An ultrasound image was saved and sent to PACS. Over a guidewire, a 30 mm Ensnare device was advanced into the SVC. The proximal end of the catheter fragment was noted to be mobile. This was snared, and the fragment was retracted in its entirety out the 6 Fr sheath. Follow-up spot radiograph shows no retained fragments. RIGHT jugular venogram performed through the sheath shows no intravascular thrombus, stenosis, extravasation, dissection, or retained foreign body. The sheath was removed and hemostasis was obtained with manual compression. A dressing was placed. The patient tolerated the procedure well without immediate post procedural complication. FINDINGS: Retained intravascular catheter fragment was localized in the RIGHT jugular vein. The catheter was snared from a RIGHT femoral and removed in its entirety. IMPRESSION: Successful endovascular retrieval of a retained port catheter fragment from the RIGHT jugular vein. Roanna Banning, MD [facility] Radiology Electronically Signed   By: Roanna Banning M.D.   On: 09/04/2023 19:11      ASSESSMENT & PLAN:   Assessment/Plan:  63 y.o. female  with extensive stage small cell lung cancer.  She had more difficulty tolerating her third cycle of carboplatin/etoposide/atezolizumab due to severe fatigue and weakness.  She is not feeling 100% today, but has definitely improved.  Other than mild tachycardia, she appears to be doing well.  She did not wish to stay for IV fluids today.  Will plan to see her back in 2 weeks as previously scheduled prior to her fourth cycle of chemotherapy.  The patient understands all the plans discussed today and is in agreement with them.  She knows to contact our office if she develops concerns prior to her next appointment.     Adah Perl, PA-C   Physician Assistant [facility] [place] [phone]

## 2023-09-30 ENCOUNTER — Encounter: Payer: Self-pay | Admitting: Oncology

## 2023-10-01 NOTE — Progress Notes (Signed)
 [facility] CSW Progress Note  Clinical Social Worker  spoke with [facility]  to follow up on referral placed for patient January.    Marguerita Merles, LCSW Clinical Social Worker [facility]

## 2023-10-06 NOTE — Progress Notes (Unsigned)
 [facility]  [address] [place],  [place]  [address] [phone]  Clinic Day: 10/07/2023  Referring physician: Norm Salt, PA   HISTORY OF PRESENT ILLNESS:  The patient is a 63 y.o. female with extensive stage small cell lung cancer, which includes spread of disease to her liver.  She comes in today to be evaluated before heading into her 4th and final cycle of carboplatin/etoposide/atezolizumab.  The patient claims to have tolerated her 3rd cycle of this regimen fairly well.  She still has occasional periods of shortness of breath, particularly when she exerts herself.  However, her breathing clearly has gotten better since her chemotherapy commenced.  She denies having any new symptoms or findings which concern her for overt signs of disease progression.  PHYSICAL EXAM:  Blood pressure (!) 148/77, pulse 99, temperature 98.4 F (36.9 C), temperature source Oral, resp. rate 16, height 5\' 4"  (1.626 m), weight 171 lb 8 oz (77.8 kg), SpO2 99%. Wt Readings from Last 3 Encounters:  10/07/23 171 lb 8 oz (77.8 kg)  09/18/23 170 lb (77.1 kg)  09/17/23 171 lb (77.6 kg)   Body mass index is 29.44 kg/m. Performance status (ECOG): 1 - Symptomatic but completely ambulatory Physical Exam Constitutional:      Appearance: Normal appearance. She is not ill-appearing.     Comments: An anxious appearing woman who is in a wheelchair, but physically looks much better versus previous visits   HENT:     Mouth/Throat:     Mouth: Mucous membranes are moist.     Pharynx: Oropharynx is clear. No oropharyngeal exudate or posterior oropharyngeal erythema.  Cardiovascular:     Rate and Rhythm: Normal rate and regular rhythm.     Heart sounds: No murmur heard.    No friction rub. No gallop.  Pulmonary:     Effort: Pulmonary effort is normal. No respiratory distress.     Breath sounds: Decreased breath sounds present. No wheezing, rhonchi or rales.  Abdominal:      General: Bowel sounds are normal. There is no distension.     Palpations: Abdomen is soft. There is no mass.     Tenderness: There is no abdominal tenderness.  Musculoskeletal:        General: No swelling.     Right lower leg: No edema.     Left lower leg: No edema.  Lymphadenopathy:     Cervical: No cervical adenopathy.     Upper Body:     Right upper body: No supraclavicular or axillary adenopathy.     Left upper body: No supraclavicular or axillary adenopathy.     Lower Body: No right inguinal adenopathy. No left inguinal adenopathy.  Skin:    General: Skin is warm.     Coloration: Skin is not jaundiced.     Findings: No lesion or rash.  Neurological:     General: No focal deficit present.     Mental Status: She is alert and oriented to person, place, and time. Mental status is at baseline.  Psychiatric:        Mood and Affect: Mood normal.        Behavior: Behavior normal.        Thought Content: Thought content normal.     LABS:      Latest Ref Rng & Units 10/07/2023    9:14 AM 09/21/2023   12:46 PM 09/16/2023    9:00 AM  CBC  WBC 4.0 - 10.5 K/uL 8.7  6.4  9.2   Hemoglobin 12.0 - 15.0 g/dL 30.8  65.7  84.6   Hematocrit 36.0 - 46.0 % 35.1  33.3  34.4   Platelets 150 - 400 K/uL 196  305  182       Latest Ref Rng & Units 10/07/2023    9:14 AM 09/21/2023   12:46 PM 09/16/2023    9:00 AM  CMP  Glucose 70 - 99 mg/dL 82  88  962   BUN 8 - 23 mg/dL 8  15  8    Creatinine 0.44 - 1.00 mg/dL 9.52  8.41  3.24   Sodium 135 - 145 mmol/L 139  138  140   Potassium 3.5 - 5.1 mmol/L 4.1  4.1  4.0   Chloride 98 - 111 mmol/L 101  102  104   CO2 22 - 32 mmol/L 27  26  27    Calcium 8.9 - 10.3 mg/dL 9.8  9.1  9.1   Total Protein 6.5 - 8.1 g/dL 7.4  6.5  6.8   Total Bilirubin 0.0 - 1.2 mg/dL 0.2  0.8  <4.0   Alkaline Phos 38 - 126 U/L 131  94  117   AST 15 - 41 U/L 22  25  20    ALT 0 - 44 U/L 22  26  14     ASSESSMENT & PLAN:  A 63 y.o. female with extensive stage small cell lung  cancer, including spread of disease to her liver and bones.  She will proceed with her 4th cycle of carboplatin/etoposide/atezolizumab today.  Neulasta injections will continue to be given after each cycle of treatment to prevent severe neutropenia from delaying successive cycles of treatment.  Overall, the patient is clearly doing much better.  I will see this patient back in 3 weeks, with CT scans being done a day before her next visit to ascertain her new disease baseline after 4 cycles of carboplatin/etoposide/atezolizumab.  The patient understands all the plans discussed today and is in agreement with them.  Mikhaila Roh Kirby Funk, MD

## 2023-10-07 ENCOUNTER — Inpatient Hospital Stay: Payer: Medicaid Other

## 2023-10-07 ENCOUNTER — Other Ambulatory Visit: Payer: Self-pay | Admitting: Oncology

## 2023-10-07 ENCOUNTER — Inpatient Hospital Stay: Payer: Medicaid Other | Admitting: Oncology

## 2023-10-07 VITALS — BP 148/77 | HR 99 | Temp 98.4°F | Resp 16 | Ht 64.0 in | Wt 171.5 lb

## 2023-10-07 DIAGNOSIS — C3412 Malignant neoplasm of upper lobe, left bronchus or lung: Secondary | ICD-10-CM | POA: Diagnosis not present

## 2023-10-07 DIAGNOSIS — Z5112 Encounter for antineoplastic immunotherapy: Secondary | ICD-10-CM | POA: Diagnosis not present

## 2023-10-07 LAB — CBC WITH DIFFERENTIAL (CANCER CENTER ONLY)
Abs Immature Granulocytes: 0.07 10*3/uL (ref 0.00–0.07)
Basophils Absolute: 0.1 10*3/uL (ref 0.0–0.1)
Basophils Relative: 1 %
Eosinophils Absolute: 0.1 10*3/uL (ref 0.0–0.5)
Eosinophils Relative: 1 %
HCT: 35.1 % — ABNORMAL LOW (ref 36.0–46.0)
Hemoglobin: 11.3 g/dL — ABNORMAL LOW (ref 12.0–15.0)
Immature Granulocytes: 1 %
Lymphocytes Relative: 11 %
Lymphs Abs: 1 10*3/uL (ref 0.7–4.0)
MCH: 31.8 pg (ref 26.0–34.0)
MCHC: 32.2 g/dL (ref 30.0–36.0)
MCV: 98.9 fL (ref 80.0–100.0)
Monocytes Absolute: 0.5 10*3/uL (ref 0.1–1.0)
Monocytes Relative: 6 %
Neutro Abs: 7 10*3/uL (ref 1.7–7.7)
Neutrophils Relative %: 80 %
Platelet Count: 196 10*3/uL (ref 150–400)
RBC: 3.55 MIL/uL — ABNORMAL LOW (ref 3.87–5.11)
RDW: 18.9 % — ABNORMAL HIGH (ref 11.5–15.5)
WBC Count: 8.7 10*3/uL (ref 4.0–10.5)
nRBC: 0 % (ref 0.0–0.2)
nRBC: 0 /100{WBCs}

## 2023-10-07 LAB — CMP (CANCER CENTER ONLY)
ALT: 22 U/L (ref 0–44)
AST: 22 U/L (ref 15–41)
Albumin: 4.6 g/dL (ref 3.5–5.0)
Alkaline Phosphatase: 131 U/L — ABNORMAL HIGH (ref 38–126)
Anion gap: 11 (ref 5–15)
BUN: 8 mg/dL (ref 8–23)
CO2: 27 mmol/L (ref 22–32)
Calcium: 9.8 mg/dL (ref 8.9–10.3)
Chloride: 101 mmol/L (ref 98–111)
Creatinine: 0.65 mg/dL (ref 0.44–1.00)
GFR, Estimated: >60 mL/min (ref >60)
Glucose, Bld: 82 mg/dL (ref 70–99)
Potassium: 4.1 mmol/L (ref 3.5–5.1)
Sodium: 139 mmol/L (ref 135–145)
Total Bilirubin: 0.2 mg/dL (ref 0.0–1.2)
Total Protein: 7.4 g/dL (ref 6.5–8.1)

## 2023-10-07 MED ORDER — DEXAMETHASONE SODIUM PHOSPHATE 10 MG/ML IJ SOLN
10.0000 mg | Freq: Once | INTRAMUSCULAR | Status: AC
  Administered 2023-10-07: 10 mg via INTRAVENOUS
  Filled 2023-10-07: qty 1

## 2023-10-07 MED ORDER — SODIUM CHLORIDE 0.9 % IV SOLN
98.0000 mg/m2 | Freq: Once | INTRAVENOUS | Status: AC
  Administered 2023-10-07: 180 mg via INTRAVENOUS
  Filled 2023-10-07: qty 9

## 2023-10-07 MED ORDER — SODIUM CHLORIDE 0.9 % IV SOLN: INTRAVENOUS | Status: DC

## 2023-10-07 MED ORDER — SODIUM CHLORIDE 0.9 % IV SOLN
150.0000 mg | Freq: Once | INTRAVENOUS | Status: AC
  Administered 2023-10-07: 150 mg via INTRAVENOUS
  Filled 2023-10-07: qty 150

## 2023-10-07 MED ORDER — HEPARIN SOD (PORK) LOCK FLUSH 100 UNIT/ML IV SOLN
500.0000 [IU] | Freq: Once | INTRAVENOUS | Status: DC | PRN
Start: 2023-10-07 — End: 2023-10-08

## 2023-10-07 MED ORDER — SODIUM CHLORIDE 0.9 % IV SOLN
1200.0000 mg | Freq: Once | INTRAVENOUS | Status: AC
  Administered 2023-10-07: 1200 mg via INTRAVENOUS
  Filled 2023-10-07: qty 20

## 2023-10-07 MED ORDER — SODIUM CHLORIDE 0.9% FLUSH
10.0000 mL | INTRAVENOUS | Status: DC | PRN
Start: 2023-10-07 — End: 2023-10-08

## 2023-10-07 MED ORDER — PALONOSETRON HCL INJECTION 0.25 MG/5ML
0.2500 mg | Freq: Once | INTRAVENOUS | Status: AC
Start: 2023-10-07 — End: 2023-10-07
  Administered 2023-10-07: 0.25 mg via INTRAVENOUS
  Filled 2023-10-07: qty 5

## 2023-10-07 MED ORDER — SODIUM CHLORIDE 0.9 % IV SOLN
565.5000 mg | Freq: Once | INTRAVENOUS | Status: AC
  Administered 2023-10-07: 570 mg via INTRAVENOUS
  Filled 2023-10-07: qty 57

## 2023-10-08 ENCOUNTER — Encounter: Payer: Self-pay | Admitting: Oncology

## 2023-10-08 ENCOUNTER — Inpatient Hospital Stay: Payer: Medicaid Other

## 2023-10-08 ENCOUNTER — Other Ambulatory Visit: Payer: Self-pay

## 2023-10-08 VITALS — BP 126/98 | HR 99 | Temp 98.0°F | Resp 18 | Ht 64.0 in | Wt 176.0 lb

## 2023-10-08 DIAGNOSIS — C3412 Malignant neoplasm of upper lobe, left bronchus or lung: Secondary | ICD-10-CM

## 2023-10-08 DIAGNOSIS — Z5112 Encounter for antineoplastic immunotherapy: Secondary | ICD-10-CM | POA: Diagnosis not present

## 2023-10-08 MED ORDER — HEPARIN SOD (PORK) LOCK FLUSH 100 UNIT/ML IV SOLN: 500.0000 [IU] | Freq: Once | INTRAVENOUS | Status: DC | PRN

## 2023-10-08 MED ORDER — DEXAMETHASONE SODIUM PHOSPHATE 10 MG/ML IJ SOLN
10.0000 mg | Freq: Once | INTRAMUSCULAR | Status: AC
  Administered 2023-10-08: 10 mg via INTRAVENOUS
  Filled 2023-10-08: qty 1

## 2023-10-08 MED ORDER — SODIUM CHLORIDE 0.9% FLUSH
10.0000 mL | INTRAVENOUS | Status: DC | PRN
Start: 2023-10-08 — End: 2023-10-08

## 2023-10-08 MED ORDER — SODIUM CHLORIDE 0.9 % IV SOLN
98.0000 mg/m2 | Freq: Once | INTRAVENOUS | Status: AC
  Administered 2023-10-08: 180 mg via INTRAVENOUS
  Filled 2023-10-08: qty 9

## 2023-10-08 MED ORDER — SODIUM CHLORIDE 0.9 % IV SOLN: INTRAVENOUS | Status: DC

## 2023-10-08 NOTE — Patient Instructions (Signed)
Etoposide Injection What is this medication? ETOPOSIDE (e toe POE side) treats some types of cancer. It works by slowing down the growth of cancer cells. This medicine may be used for other purposes; ask your health care provider or pharmacist if you have questions. COMMON BRAND NAME(S): Etopophos, Toposar, VePesid What should I tell my care team before I take this medication? They need to know if you have any of these conditions: Infection Kidney disease Liver disease Low blood counts, such as low white cell, platelet, red cell counts An unusual or allergic reaction to etoposide, other medications, foods, dyes, or preservatives If you or your partner are pregnant or trying to get pregnant Breastfeeding How should I use this medication? This medication is injected into a vein. It is given by your care team in a hospital or clinic setting. Talk to your care team about the use of this medication in children. Special care may be needed. Overdosage: If you think you have taken too much of this medicine contact a poison control center or emergency room at once. NOTE: This medicine is only for you. Do not share this medicine with others. What if I miss a dose? Keep appointments for follow-up doses. It is important not to miss your dose. Call your care team if you are unable to keep an appointment. What may interact with this medication? Warfarin This list may not describe all possible interactions. Give your health care provider a list of all the medicines, herbs, non-prescription drugs, or dietary supplements you use. Also tell them if you smoke, drink alcohol, or use illegal drugs. Some items may interact with your medicine. What should I watch for while using this medication? Your condition will be monitored carefully while you are receiving this medication. This medication may make you feel generally unwell. This is not uncommon as chemotherapy can affect healthy cells as well as cancer  cells. Report any side effects. Continue your course of treatment even though you feel ill unless your care team tells you to stop. This medication can cause serious side effects. To reduce the risk, your care team may give you other medications to take before receiving this one. Be sure to follow the directions from your care team. This medication may increase your risk of getting an infection. Call your care team for advice if you get a fever, chills, sore throat, or other symptoms of a cold or flu. Do not treat yourself. Try to avoid being around people who are sick. This medication may increase your risk to bruise or bleed. Call your care team if you notice any unusual bleeding. Talk to your care team about your risk of cancer. You may be more at risk for certain types of cancers if you take this medication. Talk to your care team if you may be pregnant. Serious birth defects can occur if you take this medication during pregnancy and for 6 months after the last dose. You will need a negative pregnancy test before starting this medication. Contraception is recommended while taking this medication and for 6 months after the last dose. Your care team can help you find the option that works for you. If your partner can get pregnant, use a condom during sex while taking this medication and for 4 months after the last dose. Do not breastfeed while taking this medication. This medication may cause infertility. Talk to your care team if you are concerned about your fertility. What side effects may I notice from receiving this medication?  Side effects that you should report to your care team as soon as possible: Allergic reactions--skin rash, itching, hives, swelling of the face, lips, tongue, or throat Infection--fever, chills, cough, sore throat, wounds that don't heal, pain or trouble when passing urine, general feeling of discomfort or being unwell Low red blood cell level--unusual weakness or fatigue,  dizziness, headache, trouble breathing Unusual bruising or bleeding Side effects that usually do not require medical attention (report to your care team if they continue or are bothersome): Diarrhea Fatigue Hair loss Loss of appetite Nausea Vomiting This list may not describe all possible side effects. Call your doctor for medical advice about side effects. You may report side effects to FDA at 1-800-FDA-1088. Where should I keep my medication? This medication is given in a hospital or clinic. It will not be stored at home. NOTE: This sheet is a summary. It may not cover all possible information. If you have questions about this medicine, talk to your doctor, pharmacist, or health care provider.  2024 Elsevier/Gold Standard (2021-11-28 00:00:00)

## 2023-10-09 ENCOUNTER — Encounter: Payer: Self-pay | Admitting: Oncology

## 2023-10-09 ENCOUNTER — Inpatient Hospital Stay: Payer: Medicaid Other

## 2023-10-09 VITALS — BP 146/71 | HR 90 | Temp 98.0°F | Resp 18 | Ht 64.0 in | Wt 177.0 lb

## 2023-10-09 DIAGNOSIS — C3412 Malignant neoplasm of upper lobe, left bronchus or lung: Secondary | ICD-10-CM

## 2023-10-09 DIAGNOSIS — Z5112 Encounter for antineoplastic immunotherapy: Secondary | ICD-10-CM | POA: Diagnosis not present

## 2023-10-09 MED ORDER — SODIUM CHLORIDE 0.9 % IV SOLN: INTRAVENOUS | Status: DC

## 2023-10-09 MED ORDER — SODIUM CHLORIDE 0.9% FLUSH
10.0000 mL | INTRAVENOUS | Status: DC | PRN
  Administered 2023-10-09: 10 mL

## 2023-10-09 MED ORDER — SODIUM CHLORIDE 0.9 % IV SOLN
98.0000 mg/m2 | Freq: Once | INTRAVENOUS | Status: AC
  Administered 2023-10-09: 180 mg via INTRAVENOUS
  Filled 2023-10-09: qty 9

## 2023-10-09 MED ORDER — HEPARIN SOD (PORK) LOCK FLUSH 100 UNIT/ML IV SOLN
500.0000 [IU] | Freq: Once | INTRAVENOUS | Status: AC | PRN
  Administered 2023-10-09: 500 [IU]

## 2023-10-09 MED ORDER — DEXAMETHASONE SODIUM PHOSPHATE 10 MG/ML IJ SOLN
10.0000 mg | Freq: Once | INTRAMUSCULAR | Status: AC
  Administered 2023-10-09: 10 mg via INTRAVENOUS
  Filled 2023-10-09: qty 1

## 2023-10-09 NOTE — Patient Instructions (Signed)
Etoposide Injection What is this medication? ETOPOSIDE (e toe POE side) treats some types of cancer. It works by slowing down the growth of cancer cells. This medicine may be used for other purposes; ask your health care provider or pharmacist if you have questions. COMMON BRAND NAME(S): Etopophos, Toposar, VePesid What should I tell my care team before I take this medication? They need to know if you have any of these conditions: Infection Kidney disease Liver disease Low blood counts, such as low white cell, platelet, red cell counts An unusual or allergic reaction to etoposide, other medications, foods, dyes, or preservatives If you or your partner are pregnant or trying to get pregnant Breastfeeding How should I use this medication? This medication is injected into a vein. It is given by your care team in a hospital or clinic setting. Talk to your care team about the use of this medication in children. Special care may be needed. Overdosage: If you think you have taken too much of this medicine contact a poison control center or emergency room at once. NOTE: This medicine is only for you. Do not share this medicine with others. What if I miss a dose? Keep appointments for follow-up doses. It is important not to miss your dose. Call your care team if you are unable to keep an appointment. What may interact with this medication? Warfarin This list may not describe all possible interactions. Give your health care provider a list of all the medicines, herbs, non-prescription drugs, or dietary supplements you use. Also tell them if you smoke, drink alcohol, or use illegal drugs. Some items may interact with your medicine. What should I watch for while using this medication? Your condition will be monitored carefully while you are receiving this medication. This medication may make you feel generally unwell. This is not uncommon as chemotherapy can affect healthy cells as well as cancer  cells. Report any side effects. Continue your course of treatment even though you feel ill unless your care team tells you to stop. This medication can cause serious side effects. To reduce the risk, your care team may give you other medications to take before receiving this one. Be sure to follow the directions from your care team. This medication may increase your risk of getting an infection. Call your care team for advice if you get a fever, chills, sore throat, or other symptoms of a cold or flu. Do not treat yourself. Try to avoid being around people who are sick. This medication may increase your risk to bruise or bleed. Call your care team if you notice any unusual bleeding. Talk to your care team about your risk of cancer. You may be more at risk for certain types of cancers if you take this medication. Talk to your care team if you may be pregnant. Serious birth defects can occur if you take this medication during pregnancy and for 6 months after the last dose. You will need a negative pregnancy test before starting this medication. Contraception is recommended while taking this medication and for 6 months after the last dose. Your care team can help you find the option that works for you. If your partner can get pregnant, use a condom during sex while taking this medication and for 4 months after the last dose. Do not breastfeed while taking this medication. This medication may cause infertility. Talk to your care team if you are concerned about your fertility. What side effects may I notice from receiving this medication?  Side effects that you should report to your care team as soon as possible: Allergic reactions--skin rash, itching, hives, swelling of the face, lips, tongue, or throat Infection--fever, chills, cough, sore throat, wounds that don't heal, pain or trouble when passing urine, general feeling of discomfort or being unwell Low red blood cell level--unusual weakness or fatigue,  dizziness, headache, trouble breathing Unusual bruising or bleeding Side effects that usually do not require medical attention (report to your care team if they continue or are bothersome): Diarrhea Fatigue Hair loss Loss of appetite Nausea Vomiting This list may not describe all possible side effects. Call your doctor for medical advice about side effects. You may report side effects to FDA at 1-800-FDA-1088. Where should I keep my medication? This medication is given in a hospital or clinic. It will not be stored at home. NOTE: This sheet is a summary. It may not cover all possible information. If you have questions about this medicine, talk to your doctor, pharmacist, or health care provider.  2024 Elsevier/Gold Standard (2021-11-28 00:00:00)

## 2023-10-10 ENCOUNTER — Other Ambulatory Visit: Payer: Self-pay

## 2023-10-12 ENCOUNTER — Encounter: Payer: Self-pay | Admitting: Oncology

## 2023-10-12 ENCOUNTER — Inpatient Hospital Stay: Payer: Medicaid Other

## 2023-10-12 ENCOUNTER — Other Ambulatory Visit: Payer: Self-pay | Admitting: Pharmacist

## 2023-10-12 VITALS — BP 121/83 | HR 109 | Temp 97.7°F | Resp 20 | Ht 64.0 in

## 2023-10-12 DIAGNOSIS — Z5112 Encounter for antineoplastic immunotherapy: Secondary | ICD-10-CM | POA: Diagnosis not present

## 2023-10-12 DIAGNOSIS — C3412 Malignant neoplasm of upper lobe, left bronchus or lung: Secondary | ICD-10-CM

## 2023-10-12 DIAGNOSIS — R0602 Shortness of breath: Secondary | ICD-10-CM

## 2023-10-12 MED ORDER — ALBUTEROL SULFATE HFA 108 (90 BASE) MCG/ACT IN AERS
2.0000 | INHALATION_SPRAY | Freq: Once | RESPIRATORY_TRACT | Status: AC
  Administered 2023-10-12: 2 via RESPIRATORY_TRACT

## 2023-10-12 MED ORDER — HEPARIN SOD (PORK) LOCK FLUSH 100 UNIT/ML IV SOLN
500.0000 [IU] | Freq: Once | INTRAVENOUS | Status: AC
  Administered 2023-10-12: 500 [IU] via INTRAVENOUS

## 2023-10-12 MED ORDER — SODIUM CHLORIDE FLUSH 0.9 % IV SOLN
10.0000 mL | INTRAVENOUS | Status: DC | PRN
  Administered 2023-10-12: 10 mL via INTRAVENOUS

## 2023-10-12 MED ORDER — DEXAMETHASONE SODIUM PHOSPHATE 10 MG/ML IJ SOLN
10.0000 mg | Freq: Once | INTRAMUSCULAR | Status: AC
  Administered 2023-10-12: 10 mg via INTRAVENOUS
  Filled 2023-10-12: qty 1

## 2023-10-12 MED ORDER — PEGFILGRASTIM-CBQV 6 MG/0.6ML ~~LOC~~ SOSY
6.0000 mg | PREFILLED_SYRINGE | Freq: Once | SUBCUTANEOUS | Status: AC
  Administered 2023-10-12: 6 mg via SUBCUTANEOUS
  Filled 2023-10-12: qty 0.6

## 2023-10-12 MED ORDER — LORAZEPAM 2 MG/ML IJ SOLN
0.5000 mg | Freq: Once | INTRAMUSCULAR | Status: AC
  Administered 2023-10-12: 0.5 mg via INTRAVENOUS
  Filled 2023-10-12: qty 1

## 2023-10-12 NOTE — Patient Instructions (Signed)

## 2023-10-12 NOTE — Progress Notes (Signed)
 1000 THE PATIENT CAME IN TODAY IN A WHEELCHAIR. THE PATIENT STATED THAT SHE HAS FELT AWFUL ALL WEEKEND. SHE DID NOT CALL OVER THE WEEKEND BECAUSE SHE THOUGHT THAT WE WERE CLOSED ON THE WEEKEND. I INFORMED PATIENT TO CALL ANYWAY THAT SOMEONE WAS ALWAYS ON CALL FOR THE PATIENTS. SHE WAS TEARFUL AND COMPLAINT OF SHORTNESS OF BREATH MORE THAN HER USUAL. COUGHING CLEAR SPUTUM,  NO FEVER, VERY WEAK, DECREASE APPETITE. BLOOD PRESSURE WAS WITH IN NORMAL LIMITS. PULSE OXIMETER READINGS AT 100. PULSE  AT 109 INITIALLY LYING. THE PATIENT WAS TOO WEAK TO STAND. THE PATIENT THINKS SHE NEEDS IV FLUIDS. SHE STATED THAT SHE HAS NOT HAD MUCH TO DRINK. SHE ALSO STATED THAT THE LEFT SIDE OF HER CHEST SHE HAD SOME PAIN. RATING AT ABOUT A 4. THIS WAS NEW ALSO. I INFORMED ALL OF THIS TO Consuello Masse, NP. 1025 THE PATIENT WAS GETTING MORE RESTLESS BY THE MINUTE. OXYGEN APPLIED AT 4L/[place], THEN A NON REBREATHER WAS PUT ON. THE PATIENT BECAME VERY ANXIOUS AND SAT UP BECAUSE SHE COULD NOT BREATH. PULSE OXIMETER READINGS WERE ALWAYS AT 100% DR. Melvyn Neth AND LAUREN ALLEN WAS INFORMED OF THE DRASTIC CHANGE IN THE PATIENT'S BREATHING. DR Melvyn Neth AND LAUREN WAS AT BEDSIDE. ORDERS WERE GIVEN TO GIVE ATIVAN 0.5MG  IV AND DECADRON 10MG  IV, WE ALSO GAVE PATIENT 2 PUFFS OF ALBUTEROL INHALER. SEE MAR FOR TIMES.  1050 PATIENT RECEIVED DECADRON 10MG  IV FOR SHORTNESS OF BREATH PER DR, LEWIS ORDERS. 1110 THE PATIENT IS STABLE NON-REBREATHER TAKEN OFF. OXYGEN AT 4L/[place] AT PRESENT. PULSE OXIMETER READINGS AT 100. THE PATIENT IS RESTING QUIETLY. NO FURTHER COMPLIANTS VOICED. 1120 OXYGEN AT 2L/[place]. PATIENT CONTINUES TO HAVE RESPIRATORY DISTRESS. RESTING QUIETLY. 1215 OXYGEN TAKEN OFF FOR PATIENT TO GO TO BATHROOM. PATIENT TOLERATED WELL. NO RESPIRATORY DISTRESS NOTED. 1255 PATIENT IS OFF OXYGEN DOING WELL. NO RESPIRATORY DISTRESS NOTED. LAUREN ALLEN,NP CAME BY TO ASSESS PATIENT AND DISCHARGE HER. THE PATIENT VERBALIZES UNDERSTANDING TO CALL AN AMBULANCE IF THIS  HAPPENS AGAIN AT HOME.

## 2023-10-13 ENCOUNTER — Other Ambulatory Visit: Payer: Self-pay | Admitting: Nurse Practitioner

## 2023-10-13 ENCOUNTER — Other Ambulatory Visit (HOSPITAL_BASED_OUTPATIENT_CLINIC_OR_DEPARTMENT_OTHER): Payer: Self-pay

## 2023-10-13 DIAGNOSIS — C3412 Malignant neoplasm of upper lobe, left bronchus or lung: Secondary | ICD-10-CM

## 2023-10-13 DIAGNOSIS — R0602 Shortness of breath: Secondary | ICD-10-CM

## 2023-10-13 MED ORDER — IPRATROPIUM-ALBUTEROL 0.5-2.5 (3) MG/3ML IN SOLN
3.0000 mL | Freq: Four times a day (QID) | RESPIRATORY_TRACT | 3 refills | Status: DC | PRN
  Filled 2023-10-13: qty 360, 30d supply, fill #0

## 2023-10-13 NOTE — Progress Notes (Signed)
 Patient was seen in infusion on 10/12/23 due to worsening shortness of breath. She denies fever, chills. Generally feels poorly. Endorses anxiety. SpO2 was normal. She received ativan 0.5 mg and decadron 10 mg in clinic along with albuterol inhaler. Symptoms improved and she was discharged home. I spoke to her this morning to recheck her symptoms. Improved but persistent tightness and cough. Recommend trial of home nebulizer with combivent for symptoms. If worsening, consider imaging but I will defer to Dr Melvyn Neth her medical oncologist. Patient verbalizes understanding. Appreciate nursing coordinating care and teaching use of nebulizer. Prescriptions sent.

## 2023-10-15 ENCOUNTER — Other Ambulatory Visit (HOSPITAL_BASED_OUTPATIENT_CLINIC_OR_DEPARTMENT_OTHER): Payer: Self-pay

## 2023-10-15 ENCOUNTER — Other Ambulatory Visit: Payer: Self-pay

## 2023-10-15 MED ORDER — IPRATROPIUM-ALBUTEROL 0.5-2.5 (3) MG/3ML IN SOLN: 3.0000 mL | Freq: Four times a day (QID) | RESPIRATORY_TRACT | 3 refills | Status: AC | PRN

## 2023-10-20 ENCOUNTER — Encounter: Payer: Self-pay | Admitting: Oncology

## 2023-10-22 ENCOUNTER — Other Ambulatory Visit (HOSPITAL_BASED_OUTPATIENT_CLINIC_OR_DEPARTMENT_OTHER): Admitting: Radiology

## 2023-10-22 LAB — COMPREHENSIVE METABOLIC PANEL WITH GFR: EGFR (Non-African Amer.): 106

## 2023-10-22 NOTE — Progress Notes (Signed)
 [facility] CSW Progress Note  Visual merchandiser  received call on this date from [facility] ([facility])  to provide follow up on previous call. Staff member reported CSW needs to call [facility] for update on [facility]. Staff member confirmed that new referrals can continue to be sent to them.   Marguerita Merles, LCSW Clinical Social Worker [facility]

## 2023-10-27 ENCOUNTER — Inpatient Hospital Stay: Attending: Oncology

## 2023-10-27 ENCOUNTER — Encounter: Payer: Self-pay | Admitting: Oncology

## 2023-10-27 DIAGNOSIS — C3492 Malignant neoplasm of unspecified part of left bronchus or lung: Secondary | ICD-10-CM | POA: Diagnosis present

## 2023-10-27 DIAGNOSIS — Z5112 Encounter for antineoplastic immunotherapy: Secondary | ICD-10-CM | POA: Diagnosis present

## 2023-10-27 DIAGNOSIS — Z79620 Long term (current) use of immunosuppressive biologic: Secondary | ICD-10-CM | POA: Diagnosis not present

## 2023-10-27 DIAGNOSIS — Z5189 Encounter for other specified aftercare: Secondary | ICD-10-CM | POA: Insufficient documentation

## 2023-10-27 DIAGNOSIS — C3412 Malignant neoplasm of upper lobe, left bronchus or lung: Secondary | ICD-10-CM | POA: Insufficient documentation

## 2023-10-27 DIAGNOSIS — Z452 Encounter for adjustment and management of vascular access device: Secondary | ICD-10-CM | POA: Insufficient documentation

## 2023-10-27 DIAGNOSIS — C787 Secondary malignant neoplasm of liver and intrahepatic bile duct: Secondary | ICD-10-CM | POA: Diagnosis not present

## 2023-10-27 DIAGNOSIS — C7951 Secondary malignant neoplasm of bone: Secondary | ICD-10-CM | POA: Diagnosis not present

## 2023-10-27 LAB — CBC WITH DIFFERENTIAL (CANCER CENTER ONLY)
Abs Immature Granulocytes: 0.11 10*3/uL — ABNORMAL HIGH (ref 0.00–0.07)
Basophils Absolute: 0 10*3/uL (ref 0.0–0.1)
Basophils Relative: 0 %
Eosinophils Absolute: 0.1 10*3/uL (ref 0.0–0.5)
Eosinophils Relative: 1 %
HCT: 33.2 % — ABNORMAL LOW (ref 36.0–46.0)
Hemoglobin: 10.5 g/dL — ABNORMAL LOW (ref 12.0–15.0)
Immature Granulocytes: 2 %
Lymphocytes Relative: 20 %
Lymphs Abs: 1.3 10*3/uL (ref 0.7–4.0)
MCH: 31.7 pg (ref 26.0–34.0)
MCHC: 31.6 g/dL (ref 30.0–36.0)
MCV: 100.3 fL — ABNORMAL HIGH (ref 80.0–100.0)
Monocytes Absolute: 0.3 10*3/uL (ref 0.1–1.0)
Monocytes Relative: 4 %
Neutro Abs: 4.8 10*3/uL (ref 1.7–7.7)
Neutrophils Relative %: 73 %
Platelet Count: 148 10*3/uL — ABNORMAL LOW (ref 150–400)
RBC: 3.31 MIL/uL — ABNORMAL LOW (ref 3.87–5.11)
RDW: 19.3 % — ABNORMAL HIGH (ref 11.5–15.5)
WBC Count: 6.6 10*3/uL (ref 4.0–10.5)
nRBC: 0 /100{WBCs}
nRBC: 0.02 % (ref 0.0–0.2)

## 2023-10-27 LAB — CMP (CANCER CENTER ONLY)
ALT: 19 U/L (ref 0–44)
AST: 19 U/L (ref 15–41)
Albumin: 4.5 g/dL (ref 3.5–5.0)
Alkaline Phosphatase: 119 U/L (ref 38–126)
Anion gap: 11 (ref 5–15)
BUN: 8 mg/dL (ref 8–23)
CO2: 25 mmol/L (ref 22–32)
Calcium: 9.7 mg/dL (ref 8.9–10.3)
Chloride: 104 mmol/L (ref 98–111)
Creatinine: 0.62 mg/dL (ref 0.44–1.00)
GFR, Estimated: >60 mL/min (ref >60)
Glucose, Bld: 82 mg/dL (ref 70–99)
Potassium: 4 mmol/L (ref 3.5–5.1)
Sodium: 139 mmol/L (ref 135–145)
Total Bilirubin: <0.2 mg/dL (ref 0.0–1.2)
Total Protein: 7.1 g/dL (ref 6.5–8.1)

## 2023-10-27 MED ORDER — SODIUM CHLORIDE 0.9% FLUSH
10.0000 mL | INTRAVENOUS | Status: DC | PRN
  Administered 2023-10-27: 10 mL via INTRAVENOUS

## 2023-10-27 MED ORDER — HEPARIN SOD (PORK) LOCK FLUSH 100 UNIT/ML IV SOLN
500.0000 [IU] | Freq: Once | INTRAVENOUS | Status: AC
  Administered 2023-10-27: 500 [IU] via INTRAVENOUS

## 2023-10-27 NOTE — Progress Notes (Incomplete)
 [facility]  [address] [place],  [place]  [address] [phone]  Clinic Day: 10/07/2023  Referring physician: Norm Salt, PA   HISTORY OF PRESENT ILLNESS:  The patient is a 63 y.o. female with extensive stage small cell lung cancer, which includes spread of disease to her liver.  She comes in today to go over her CT scans to ascertain her new disease baseline after receiving 4 cycles of carboplatin/etoposide/atezolizumab.  The patient claims to have tolerated her 4th cycle of this regimen fairly well.  She still has occasional periods of shortness of breath, particularly when she exerts herself.  However, her breathing clearly has gotten better since her chemotherapy commenced.  She denies having any new symptoms or findings which concern her for overt signs of disease progression.  PHYSICAL EXAM:  There were no vitals taken for this visit. Wt Readings from Last 3 Encounters:  10/09/23 177 lb (80.3 kg)  10/08/23 176 lb (79.8 kg)  10/07/23 171 lb 8 oz (77.8 kg)   There is no height or weight on file to calculate BMI. Performance status (ECOG): 1 - Symptomatic but completely ambulatory Physical Exam Constitutional:      Appearance: Normal appearance. She is not ill-appearing.     Comments: An anxious appearing woman who is in a wheelchair, but physically looks much better versus previous visits   HENT:     Mouth/Throat:     Mouth: Mucous membranes are moist.     Pharynx: Oropharynx is clear. No oropharyngeal exudate or posterior oropharyngeal erythema.  Cardiovascular:     Rate and Rhythm: Normal rate and regular rhythm.     Heart sounds: No murmur heard.    No friction rub. No gallop.  Pulmonary:     Effort: Pulmonary effort is normal. No respiratory distress.     Breath sounds: Decreased breath sounds present. No wheezing, rhonchi or rales.  Abdominal:     General: Bowel sounds are normal. There is no distension.     Palpations:  Abdomen is soft. There is no mass.     Tenderness: There is no abdominal tenderness.  Musculoskeletal:        General: No swelling.     Right lower leg: No edema.     Left lower leg: No edema.  Lymphadenopathy:     Cervical: No cervical adenopathy.     Upper Body:     Right upper body: No supraclavicular or axillary adenopathy.     Left upper body: No supraclavicular or axillary adenopathy.     Lower Body: No right inguinal adenopathy. No left inguinal adenopathy.  Skin:    General: Skin is warm.     Coloration: Skin is not jaundiced.     Findings: No lesion or rash.  Neurological:     General: No focal deficit present.     Mental Status: She is alert and oriented to person, place, and time. Mental status is at baseline.  Psychiatric:        Mood and Affect: Mood normal.        Behavior: Behavior normal.        Thought Content: Thought content normal.   SCANS:   CT scans of her chest/abdomen/pelvis revealed the following: FINDINGS: CHEST: Lines and tubes: There is a vascular access port over the left hemithorax.  Mediastinum: No mediastinal mass or adenopathy. There is mild soft tissue prominence in the AP window region as seen on image 25, series 2, which  is of uncertain significance.  Heart: Unremarkable  Thoracic Aorta: There is mild ectasia of the ascending aorta measuring up to 3.4 cm.  Lungs and Airways: There is bullous change in the upper lobes, left-greater-than-right. There is scarring in the left upper lobe. No definite mass. There is a 4 mm left upper lobe pulmonary nodule on image 58, series 301. there is a 4 mm right lower lobe nodule adjacent to the major fissure on image 78, series 301.  Pleura: No pneumothorax or pleural effusion.  Bones: Unremarkable   ABDOMEN AND PELVIS:  Liver: Unremarkable  Gallbladder: Surgically absent  Spleen: Unremarkable. There is a small accessory spleen noted posteriorly.  Pancreas: Unremarkable  Adrenals: There is a 2.9  cm left adrenal gland nodule. There is likely an additional nodule on the left measuring 1.3 cm. I suspect a 1.2 cm nodule on the right on image 57 series 2.  Kidneys: There is a 3.0 cm hypoattenuating lesion in the upper pole of the left kidney, likely a cyst. No obstructive uropathy.  Bladder: Unremarkable  Reproductive Organs: Unremarkable  Bowel: There are a few scattered sigmoid colon diverticula present. No evidence of diverticulitis. No bowel obstruction.  Vasculature: Atherosclerotic aortoiliac calcifications are noted. There may be stenosis of the right common iliac artery on image 91, series 2 and of the right external iliac artery on image 96.  Peritoneum / Retroperitoneum: No free fluid or free air.  Bones: There is a nonspecific sclerotic lesion in the sacrum on the left on image 99, series 2. Multilevel degenerative disc disease is noted between L3 and S1. There is a sclerotic lesion in the clavicular head on the left which is of doubtful significance.  IMPRESSION: Subcentimeter pulmonary nodules measuring up to 4 mm as detailed above. Comparison with any older outside studies is recommended.  Likely left renal cyst. Consider ultrasound.  Bilateral adrenal gland nodules. Comparison with outside studies is recommended. Metastatic disease is not excluded. Consider adrenal mass protocol CT or MRI for better characterization.  Nonspecific sclerotic sacral lesion which does not suggest metastatic disease.  LABS:      Latest Ref Rng & Units 10/27/2023   10:58 AM 10/07/2023    9:14 AM 09/21/2023   12:46 PM  CBC  WBC 4.0 - 10.5 K/uL 6.6  8.7  6.4   Hemoglobin 12.0 - 15.0 g/dL 96.0  45.4  09.8   Hematocrit 36.0 - 46.0 % 33.2  35.1  33.3   Platelets 150 - 400 K/uL 148  196  305       Latest Ref Rng & Units 10/27/2023   10:58 AM 10/07/2023    9:14 AM 09/21/2023   12:46 PM  CMP  Glucose 70 - 99 mg/dL 82  82  88   BUN 8 - 23 mg/dL 8  8  15    Creatinine 0.44 - 1.00 mg/dL 1.19   1.47  8.29   Sodium 135 - 145 mmol/L 139  139  138   Potassium 3.5 - 5.1 mmol/L 4.0  4.1  4.1   Chloride 98 - 111 mmol/L 104  101  102   CO2 22 - 32 mmol/L 25  27  26    Calcium 8.9 - 10.3 mg/dL 9.7  9.8  9.1   Total Protein 6.5 - 8.1 g/dL 7.1  7.4  6.5   Total Bilirubin 0.0 - 1.2 mg/dL <5.6  0.2  0.8   Alkaline Phos 38 - 126 U/L 119  131  94   AST  15 - 41 U/L 19  22  25    ALT 0 - 44 U/L 19  22  26     ASSESSMENT & PLAN:  A 63 y.o. female with extensive stage small cell lung cancer, including spread of disease to her liver and bones.  She will proceed with her 4th cycle of carboplatin/etoposide/atezolizumab today.  Neulasta injections will continue to be given after each cycle of treatment to prevent severe neutropenia from delaying successive cycles of treatment.  Overall, the patient is clearly doing much better.  I will see this patient back in 3 weeks, with CT scans being done a day before her next visit to ascertain her new disease baseline after 4 cycles of carboplatin/etoposide/atezolizumab.  The patient understands all the plans discussed today and is in agreement with them.  Jasnoor Trussell Kirby Funk, MD

## 2023-10-27 NOTE — Progress Notes (Signed)
 [facility]  [address] [place],  [place]  [address] [phone]  Clinic Day:  10/28/2023  Referring physician: Norm Salt, PA   HISTORY OF PRESENT ILLNESS:  The patient is a 63 y.o. female with extensive stage small cell lung cancer, which includes spread of disease to her liver.  She comes in today to go over her CT scans to ascertain her new disease baseline after receiving 4 cycles of carboplatin/etoposide/atezolizumab.  The patient claims to have tolerated her 4th cycle of this regimen fairly well.  She still has occasional periods of shortness of breath, particularly when she exerts herself.  However, her breathing clearly has gotten better since her chemotherapy commenced.  She does complain of left arm swelling, which is new.  There is a moderate degree of pain associated with it.    PHYSICAL EXAM:  Blood pressure 106/84, pulse (!) 113, temperature 98.4 F (36.9 C), temperature source Oral, resp. rate 20, height 5\' 4"  (1.626 m), weight 171 lb 12.8 oz (77.9 kg), SpO2 98%. Wt Readings from Last 3 Encounters:  10/28/23 171 lb 12.8 oz (77.9 kg)  10/09/23 177 lb (80.3 kg)  10/08/23 176 lb (79.8 kg)   Body mass index is 29.49 kg/m. Performance status (ECOG): 1 - Symptomatic but completely ambulatory Physical Exam Constitutional:      Appearance: Normal appearance. She is not ill-appearing.     Comments: An anxious appearing woman who is walking with a cane.   HENT:     Mouth/Throat:     Mouth: Mucous membranes are moist.     Pharynx: Oropharynx is clear. No oropharyngeal exudate or posterior oropharyngeal erythema.  Cardiovascular:     Rate and Rhythm: Normal rate and regular rhythm.     Heart sounds: No murmur heard.    No friction rub. No gallop.  Pulmonary:     Effort: Pulmonary effort is normal. No respiratory distress.     Breath sounds: Decreased breath sounds present. No wheezing, rhonchi or rales.  Abdominal:     General:  Bowel sounds are normal. There is no distension.     Palpations: Abdomen is soft. There is no mass.     Tenderness: There is no abdominal tenderness.  Musculoskeletal:        General: No swelling.     Right lower leg: No edema.     Left lower leg: No edema.  Lymphadenopathy:     Cervical: No cervical adenopathy.     Upper Body:     Right upper body: No supraclavicular or axillary adenopathy.     Left upper body: No supraclavicular or axillary adenopathy.     Lower Body: No right inguinal adenopathy. No left inguinal adenopathy.  Skin:    General: Skin is warm.     Coloration: Skin is not jaundiced.     Findings: No lesion or rash.  Neurological:     General: No focal deficit present.     Mental Status: She is alert and oriented to person, place, and time. Mental status is at baseline.  Psychiatric:        Mood and Affect: Mood normal.        Behavior: Behavior normal.        Thought Content: Thought content normal.   SCANS:   CT scans of her chest/abdomen/pelvis revealed the following: FINDINGS: CHEST: Lines and tubes: There is a vascular access port over the left hemithorax.  Mediastinum: No mediastinal mass or adenopathy. There  is mild soft tissue prominence in the AP window region as seen on image 25, series 2, which is of uncertain significance.  Heart: Unremarkable  Thoracic Aorta: There is mild ectasia of the ascending aorta measuring up to 3.4 cm.  Lungs and Airways: There is bullous change in the upper lobes, left-greater-than-right. There is scarring in the left upper lobe. No definite mass. There is a 4 mm left upper lobe pulmonary nodule on image 58, series 301. there is a 4 mm right lower lobe nodule adjacent to the major fissure on image 78, series 301.  Pleura: No pneumothorax or pleural effusion.  Bones: Unremarkable   ABDOMEN AND PELVIS:  Liver: Unremarkable  Gallbladder: Surgically absent  Spleen: Unremarkable. There is a small accessory spleen noted  posteriorly.  Pancreas: Unremarkable  Adrenals: There is a 2.9 cm left adrenal gland nodule. There is likely an additional nodule on the left measuring 1.3 cm. I suspect a 1.2 cm nodule on the right on image 57 series 2.  Kidneys: There is a 3.0 cm hypoattenuating lesion in the upper pole of the left kidney, likely a cyst. No obstructive uropathy.  Bladder: Unremarkable  Reproductive Organs: Unremarkable  Bowel: There are a few scattered sigmoid colon diverticula present. No evidence of diverticulitis. No bowel obstruction.  Vasculature: Atherosclerotic aortoiliac calcifications are noted. There may be stenosis of the right common iliac artery on image 91, series 2 and of the right external iliac artery on image 96.  Peritoneum / Retroperitoneum: No free fluid or free air.  Bones: There is a nonspecific sclerotic lesion in the sacrum on the left on image 99, series 2. Multilevel degenerative disc disease is noted between L3 and S1. There is a sclerotic lesion in the clavicular head on the left which is of doubtful significance.  IMPRESSION: Subcentimeter pulmonary nodules measuring up to 4 mm as detailed above. Comparison with any older outside studies is recommended.  Likely left renal cyst. Consider ultrasound.  Bilateral adrenal gland nodules. Comparison with outside studies is recommended. Metastatic disease is not excluded. Consider adrenal mass protocol CT or MRI for better characterization.  Nonspecific sclerotic sacral lesion which does not suggest metastatic disease.  LABS:      Latest Ref Rng & Units 10/27/2023   10:58 AM 10/07/2023    9:14 AM 09/21/2023   12:46 PM  CBC  WBC 4.0 - 10.5 K/uL 6.6  8.7  6.4   Hemoglobin 12.0 - 15.0 g/dL 40.9  81.1  91.4   Hematocrit 36.0 - 46.0 % 33.2  35.1  33.3   Platelets 150 - 400 K/uL 148  196  305       Latest Ref Rng & Units 10/27/2023   10:58 AM 10/07/2023    9:14 AM 09/21/2023   12:46 PM  CMP  Glucose 70 - 99 mg/dL 82  82  88    BUN 8 - 23 mg/dL 8  8  15    Creatinine 0.44 - 1.00 mg/dL 7.82  9.56  2.13   Sodium 135 - 145 mmol/L 139  139  138   Potassium 3.5 - 5.1 mmol/L 4.0  4.1  4.1   Chloride 98 - 111 mmol/L 104  101  102   CO2 22 - 32 mmol/L 25  27  26    Calcium 8.9 - 10.3 mg/dL 9.7  9.8  9.1   Total Protein 6.5 - 8.1 g/dL 7.1  7.4  6.5   Total Bilirubin 0.0 - 1.2 mg/dL <0.8  0.2  0.8   Alkaline Phos 38 - 126 U/L 119  131  94   AST 15 - 41 U/L 19  22  25    ALT 0 - 44 U/L 19  22  26     ASSESSMENT & PLAN:  A 64 y.o. female with extensive stage small cell lung cancer, including spread of disease to her liver and bones.  In clinic today, I went over all of her CT scan images with her, for which she could see that her large mediastinal mass has completely dissipated after 4 cycles of carboplatin/etoposide/atezolizumab.  There are no new sites of disease.  As she has had a positive response to her chemo/immunotherapy, her chemotherapy will now be discontinued.  Moving forward, she would just receive atezolizumab once every 3 weeks for her maintenance immunotherapy.  Her first cycle will be given today.  I will see her back in 3 weeks before she heads into her second cycle of atezolizumab.  Of note, due to her left arm swelling, I will get a stat Doppler ultrasound of her left arm to ensure a DVT is not present.  My concern is there what may be one present and potentially related to the port that is in her left chest.  I will inform her of her Doppler ultrasound results as soon as they become available.  The patient understands all the plans discussed today and is in agreement with them.  Thailyn Khalid Kirby Funk, MD

## 2023-10-28 ENCOUNTER — Other Ambulatory Visit: Payer: Self-pay | Admitting: Oncology

## 2023-10-28 ENCOUNTER — Inpatient Hospital Stay

## 2023-10-28 ENCOUNTER — Inpatient Hospital Stay: Admitting: Oncology

## 2023-10-28 ENCOUNTER — Telehealth: Payer: Self-pay

## 2023-10-28 ENCOUNTER — Telehealth: Payer: Self-pay | Admitting: Oncology

## 2023-10-28 VITALS — HR 105

## 2023-10-28 VITALS — BP 106/84 | HR 113 | Temp 98.4°F | Resp 20 | Ht 64.0 in | Wt 171.8 lb

## 2023-10-28 DIAGNOSIS — C3412 Malignant neoplasm of upper lobe, left bronchus or lung: Secondary | ICD-10-CM | POA: Diagnosis not present

## 2023-10-28 DIAGNOSIS — Z5112 Encounter for antineoplastic immunotherapy: Secondary | ICD-10-CM | POA: Diagnosis not present

## 2023-10-28 MED ORDER — SODIUM CHLORIDE 0.9% FLUSH
10.0000 mL | INTRAVENOUS | Status: DC | PRN
Start: 2023-10-28 — End: 2023-10-28

## 2023-10-28 MED ORDER — SODIUM CHLORIDE 0.9 % IV SOLN
1200.0000 mg | Freq: Once | INTRAVENOUS | Status: AC
  Administered 2023-10-28: 1200 mg via INTRAVENOUS
  Filled 2023-10-28: qty 20

## 2023-10-28 MED ORDER — SODIUM CHLORIDE 0.9 % IV SOLN: INTRAVENOUS | Status: DC

## 2023-10-28 MED ORDER — HEPARIN SOD (PORK) LOCK FLUSH 100 UNIT/ML IV SOLN: 500.0000 [IU] | Freq: Once | INTRAVENOUS | Status: DC | PRN

## 2023-10-28 NOTE — Progress Notes (Signed)
 Ok to tx with elevated HR-rechecked at 106

## 2023-10-28 NOTE — Patient Instructions (Signed)
 Atezolizumab Injection What is this medication? ATEZOLIZUMAB (a te zoe LIZ ue mab) treats some types of cancer. It works by helping your immune system slow or stop the spread of cancer cells. It is a monoclonal antibody. This medicine may be used for other purposes; ask your health care provider or pharmacist if you have questions. COMMON BRAND NAME(S): Tecentriq What should I tell my care team before I take this medication? They need to know if you have any of these conditions: Allogeneic stem cell transplant (uses someone else's stem cells) Autoimmune diseases, such as Crohn disease, ulcerative colitis, lupus History of chest radiation Nervous system problems, such as Guillain-Barre syndrome, myasthenia gravis Organ transplant An unusual or allergic reaction to atezolizumab, other medications, foods, dyes, or preservatives Pregnant or trying to get pregnant Breast-feeding How should I use this medication? This medication is injected into a vein. It is given by your care team in a hospital or clinic setting. A special MedGuide will be given to you before each treatment. Be sure to read this information carefully each time. Talk to your care team about the use of this medication in children. While it may be prescribed for children as young as 2 years for selected conditions, precautions do apply. Overdosage: If you think you have taken too much of this medicine contact a poison control center or emergency room at once. NOTE: This medicine is only for you. Do not share this medicine with others. What if I miss a dose? Keep appointments for follow-up doses. It is important not to miss your dose. Call your care team if you are unable to keep an appointment. What may interact with this medication? Interactions have not been studied. This list may not describe all possible interactions. Give your health care provider a list of all the medicines, herbs, non-prescription drugs, or dietary  supplements you use. Also tell them if you smoke, drink alcohol, or use illegal drugs. Some items may interact with your medicine. What should I watch for while using this medication? Your condition will be monitored carefully while you are receiving this medication. You may need blood work done while you are taking this medication. This medication may cause serious skin reactions. They can happen weeks to months after starting the medication. Contact your care team right away if you notice fevers or flu-like symptoms with a rash. The rash may be red or purple and then turn into blisters or peeling of the skin. You may also notice a red rash with swelling of the face, lips, or lymph nodes in your neck or under your arms. Talk to your care team if you may be pregnant. Serious birth defects can occur if you take this medication during pregnancy and for 5 months after the last dose. You will need a negative pregnancy test before starting this medication. Contraception is recommended while taking this medication and for 5 months after the last dose. Your care team can help you find the option that works for you. Do not breastfeed while taking this medication and for 5 months after the last dose. This medication may cause infertility. Talk to your care team if you are concerned about your fertility. What side effects may I notice from receiving this medication? Side effects that you should report to your doctor or health care professional as soon as possible: Allergic reactions--skin rash, itching, hives, swelling of the face, lips, tongue, or throat Dry cough, shortness of breath or trouble breathing Eye pain, redness, irritation, or discharge  with blurry or decreased vision Heart muscle inflammation--unusual weakness or fatigue, shortness of breath, chest pain, fast or irregular heartbeat, dizziness, swelling of the ankles, feet, or hands Hormone gland problems--headache, sensitivity to light, unusual  weakness or fatigue, dizziness, fast or irregular heartbeat, increased sensitivity to cold or heat, excessive sweating, constipation, hair loss, increased thirst or amount of urine, tremors or shaking, irritability Infusion reactions--chest pain, shortness of breath or trouble breathing, feeling faint or lightheaded Kidney injury (glomerulonephritis)--decrease in the amount of urine, red or dark brown urine, foamy or bubbly urine, swelling of the ankles, hands, or feet Liver injury--right upper belly pain, loss of appetite, nausea, light-colored stool, dark yellow or brown urine, yellowing skin or eyes, unusual weakness or fatigue Pain, tingling, or numbness in the hands or feet, muscle weakness, change in vision, confusion or trouble speaking, loss of balance or coordination, trouble walking, seizures Rash, fever, and swollen lymph nodes Redness, blistering, peeling, or loosening of the skin, including inside the mouth Sudden or severe stomach pain, bloody diarrhea, fever, nausea, vomiting Side effects that usually do not require medical attention (report to your doctor or health care professional if they continue or are bothersome): Bone, joint, or muscle pain Diarrhea Fatigue Loss of appetite Nausea Skin rash This list may not describe all possible side effects. Call your doctor for medical advice about side effects. You may report side effects to FDA at 1-800-FDA-1088. Where should I keep my medication? This medication is given in a hospital or clinic. It will not be stored at home. NOTE: This sheet is a summary. It may not cover all possible information. If you have questions about this medicine, talk to your doctor, pharmacist, or health care provider.  2024 Elsevier/Gold Standard (2023-04-22 00:00:00)

## 2023-10-28 NOTE — Telephone Encounter (Signed)
 [facility] CSW Progress Note  Clinical Child psychotherapist received message from CC requesting a call be made to patient regarding patient assistance. CSW confirmed with Patient Financial Specialist that patient is enrolled and utilizing [employer] [facility] CC Patient Assistance). CSW contacted patient on this date and left vm requesting a call back.   Marguerita Merles, LCSW Clinical Social Worker [facility]

## 2023-10-28 NOTE — Telephone Encounter (Signed)
 10/28/2023  Ok per Dallie Piles to give patient a gas card

## 2023-10-29 ENCOUNTER — Inpatient Hospital Stay

## 2023-10-29 NOTE — Progress Notes (Signed)
 [facility] CSW Progress Note  Clinical Child psychotherapist contacted patient by phone on this date. Patient reported not hearing back from CAP provider. CSW contacted [employer] to re-submit referral. Patient expressed challenges with understanding medical bills and medical balance. CSW contacted billing on patient's behalf to obtain billing balance. Appointment is scheduled to assist patient further.  Marguerita Merles, LCSW Clinical Social Worker [facility]

## 2023-11-02 ENCOUNTER — Encounter: Payer: Self-pay | Admitting: Oncology

## 2023-11-06 ENCOUNTER — Encounter: Payer: Self-pay | Admitting: Oncology

## 2023-11-10 ENCOUNTER — Telehealth: Payer: Self-pay

## 2023-11-10 NOTE — Telephone Encounter (Signed)
 Copied from CRM [id number]. Topic: Clinical - Medication Question >> Nov 09, 2023  3:16 PM Tyronne Galloway wrote: Reason for CRM: Patient is requesting 2 more samples of Fluticasone-Umeclidin-Vilant (TRELEGY ELLIPTA ) 100-62.5-25 MCG/ACT AEPB due to her insurance not covering the medication. Patient stated she saw NP Dara Ear on 2/17. Pt is asking for it to be ready tomorrow so she could come by and pick up the samples. Please follow up with the pt if you have questions at [phone].  Spoke with patient regarding prior message . Advised patient I will leave 2 samples of trelegy up front for patient . Advised patient to bring in ID when patient comes and gets the samples. Patient's voice was understanding.Nothing else further needed.

## 2023-11-11 ENCOUNTER — Other Ambulatory Visit: Payer: Self-pay | Admitting: Physician Assistant

## 2023-11-11 DIAGNOSIS — Z1231 Encounter for screening mammogram for malignant neoplasm of breast: Secondary | ICD-10-CM

## 2023-11-12 ENCOUNTER — Other Ambulatory Visit: Payer: Self-pay

## 2023-11-12 ENCOUNTER — Inpatient Hospital Stay

## 2023-11-12 NOTE — Progress Notes (Signed)
 [facility] CSW Progress Note  Visual merchandiser met with patient as scheduled for assistance with understanding statements being sent to her home. Patient provided CSW with packet of statements. CSW and patient successfully sorted through paperwork. Patient has CAP form to submit to PCP provider, [employer] Paperwork to Mail off, and Statements (and instructions) for [facility] and [facility] balances. Patient provided CSW with statements for [facility], CSW will attempt to reach contact at agency for guidance.   Maudie Sorrow, LCSW Clinical Social Worker [facility]

## 2023-11-17 ENCOUNTER — Other Ambulatory Visit: Payer: Self-pay | Admitting: Oncology

## 2023-11-17 DIAGNOSIS — C3412 Malignant neoplasm of upper lobe, left bronchus or lung: Secondary | ICD-10-CM

## 2023-11-17 NOTE — Progress Notes (Signed)
 [facility]  [address] [place],  [place]  [address] [phone]  Clinic Day:  11/18/2023  Referring physician: Dianah Fort, PA   HISTORY OF PRESENT ILLNESS:  The patient is a 63 y.o. female with extensive stage small cell lung cancer, which includes spread of disease to her liver.  She comes in today to be evaluated before heading into her 2nd cycle of maintenance atezolizumab  immunotherapy.  This was preceded by 4 cycles of carboplatin /etoposide /atezolizumab .  The patient claims to have tolerated her 1st cycle of atezolizumab  very well.  Other than occasional shortness of breath, she has been doing well.  She denies having any new symptoms which concern her for overt signs of disease progression.    PHYSICAL EXAM:  Blood pressure 135/83, pulse (!) 109, temperature 98.6 F (37 C), temperature source Oral, resp. rate 18, height 5\' 4"  (1.626 m), weight 175 lb (79.4 kg), SpO2 100%. Wt Readings from Last 3 Encounters:  11/18/23 175 lb (79.4 kg)  10/28/23 171 lb 12.8 oz (77.9 kg)  10/09/23 177 lb (80.3 kg)   Body mass index is 30.04 kg/m. Performance status (ECOG): 1 - Symptomatic but completely ambulatory Physical Exam Constitutional:      Appearance: Normal appearance. She is not ill-appearing.     Comments: An anxious appearing woman who is walking on her own power.   HENT:     Mouth/Throat:     Mouth: Mucous membranes are moist.     Pharynx: Oropharynx is clear. No oropharyngeal exudate or posterior oropharyngeal erythema.  Cardiovascular:     Rate and Rhythm: Normal rate and regular rhythm.     Heart sounds: No murmur heard.    No friction rub. No gallop.  Pulmonary:     Effort: Pulmonary effort is normal. No respiratory distress.     Breath sounds: Decreased breath sounds present. No wheezing, rhonchi or rales.  Abdominal:     General: Bowel sounds are normal. There is no distension.     Palpations: Abdomen is soft. There is no  mass.     Tenderness: There is no abdominal tenderness.  Musculoskeletal:        General: No swelling.     Right lower leg: No edema.     Left lower leg: No edema.  Lymphadenopathy:     Cervical: No cervical adenopathy.     Upper Body:     Right upper body: No supraclavicular or axillary adenopathy.     Left upper body: No supraclavicular or axillary adenopathy.     Lower Body: No right inguinal adenopathy. No left inguinal adenopathy.  Skin:    General: Skin is warm.     Coloration: Skin is not jaundiced.     Findings: No lesion or rash.  Neurological:     General: No focal deficit present.     Mental Status: She is alert and oriented to person, place, and time. Mental status is at baseline.  Psychiatric:        Mood and Affect: Mood normal.        Behavior: Behavior normal.        Thought Content: Thought content normal.    LABS:      Latest Ref Rng & Units 11/18/2023   12:40 PM 10/27/2023   10:58 AM 10/07/2023    9:14 AM  CBC  WBC 4.0 - 10.5 K/uL 8.2  6.6  8.7   Hemoglobin 12.0 - 15.0 g/dL 91.4  78.2  11.3  Hematocrit 36.0 - 46.0 % 41.1  33.2  35.1   Platelets 150 - 400 K/uL 166  148  196       Latest Ref Rng & Units 11/18/2023   12:40 PM 10/27/2023   10:58 AM 10/07/2023    9:14 AM  CMP  Glucose 70 - 99 mg/dL 91  82  82   BUN 8 - 23 mg/dL 9  8  8    Creatinine 0.44 - 1.00 mg/dL 8.29  5.62  1.30   Sodium 135 - 145 mmol/L 140  139  139   Potassium 3.5 - 5.1 mmol/L 4.0  4.0  4.1   Chloride 98 - 111 mmol/L 103  104  101   CO2 22 - 32 mmol/L 27  25  27    Calcium 8.9 - 10.3 mg/dL 86.5  9.7  9.8   Total Protein 6.5 - 8.1 g/dL 7.3  7.1  7.4   Total Bilirubin 0.0 - 1.2 mg/dL 0.3  <7.8  0.2   Alkaline Phos 38 - 126 U/L 114  119  131   AST 15 - 41 U/L 24  19  22    ALT 0 - 44 U/L 23  19  22     ASSESSMENT & PLAN:  A 63 y.o. female with extensive stage small cell lung cancer, including spread of disease to her liver and bones.  She will proceed with her second cycle of  atezolizumab  today, which is being given once every 3 weeks for her maintenance immunotherapy.  Overall, I am very pleased with how well the patient looks today.  I will see her back in 3 weeks before she heads into her third cycle of maintenance atezolizumab  immunotherapy.  The patient understands all the plans discussed today and is in agreement with them.  Lavaris Sexson Felicia Horde, MD

## 2023-11-17 NOTE — Progress Notes (Incomplete)
 [facility]  [address] [place],  [place]  [address] [phone]  Clinic Day:  10/28/2023  Referring physician: Dianah Fort, PA   HISTORY OF PRESENT ILLNESS:  The patient is a 63 y.o. female with extensive stage small cell lung cancer, which includes spread of disease to her liver.  She comes in today to be evaluated before heading into her 2nd cycle of maintenance atezolizumab  immunotherapy.  This was preceded by 4 cycles of carboplatin /etoposide /atezolizumab .  The patient claims to have tolerated her 1st cycle of atezolizumab      She still has occasional periods of shortness of breath, particularly when she exerts herself.  However, her breathing clearly has gotten better since her chemotherapy commenced.  She does complain of left arm swelling, which is new.  There is a moderate degree of pain associated with it.    PHYSICAL EXAM:  There were no vitals taken for this visit. Wt Readings from Last 3 Encounters:  10/28/23 171 lb 12.8 oz (77.9 kg)  10/09/23 177 lb (80.3 kg)  10/08/23 176 lb (79.8 kg)   There is no height or weight on file to calculate BMI. Performance status (ECOG): 1 - Symptomatic but completely ambulatory Physical Exam Constitutional:      Appearance: Normal appearance. She is not ill-appearing.     Comments: An anxious appearing woman who is walking with a cane.   HENT:     Mouth/Throat:     Mouth: Mucous membranes are moist.     Pharynx: Oropharynx is clear. No oropharyngeal exudate or posterior oropharyngeal erythema.  Cardiovascular:     Rate and Rhythm: Normal rate and regular rhythm.     Heart sounds: No murmur heard.    No friction rub. No gallop.  Pulmonary:     Effort: Pulmonary effort is normal. No respiratory distress.     Breath sounds: Decreased breath sounds present. No wheezing, rhonchi or rales.  Abdominal:     General: Bowel sounds are normal. There is no distension.     Palpations: Abdomen is  soft. There is no mass.     Tenderness: There is no abdominal tenderness.  Musculoskeletal:        General: No swelling.     Right lower leg: No edema.     Left lower leg: No edema.  Lymphadenopathy:     Cervical: No cervical adenopathy.     Upper Body:     Right upper body: No supraclavicular or axillary adenopathy.     Left upper body: No supraclavicular or axillary adenopathy.     Lower Body: No right inguinal adenopathy. No left inguinal adenopathy.  Skin:    General: Skin is warm.     Coloration: Skin is not jaundiced.     Findings: No lesion or rash.  Neurological:     General: No focal deficit present.     Mental Status: She is alert and oriented to person, place, and time. Mental status is at baseline.  Psychiatric:        Mood and Affect: Mood normal.        Behavior: Behavior normal.        Thought Content: Thought content normal.   SCANS:   CT scans of her chest/abdomen/pelvis revealed the following: FINDINGS: CHEST: Lines and tubes: There is a vascular access port over the left hemithorax.  Mediastinum: No mediastinal mass or adenopathy. There is mild soft tissue prominence in the AP window region as seen on image  25, series 2, which is of uncertain significance.  Heart: Unremarkable  Thoracic Aorta: There is mild ectasia of the ascending aorta measuring up to 3.4 cm.  Lungs and Airways: There is bullous change in the upper lobes, left-greater-than-right. There is scarring in the left upper lobe. No definite mass. There is a 4 mm left upper lobe pulmonary nodule on image 58, series 301. there is a 4 mm right lower lobe nodule adjacent to the major fissure on image 78, series 301.  Pleura: No pneumothorax or pleural effusion.  Bones: Unremarkable   ABDOMEN AND PELVIS:  Liver: Unremarkable  Gallbladder: Surgically absent  Spleen: Unremarkable. There is a small accessory spleen noted posteriorly.  Pancreas: Unremarkable  Adrenals: There is a 2.9 cm left  adrenal gland nodule. There is likely an additional nodule on the left measuring 1.3 cm. I suspect a 1.2 cm nodule on the right on image 57 series 2.  Kidneys: There is a 3.0 cm hypoattenuating lesion in the upper pole of the left kidney, likely a cyst. No obstructive uropathy.  Bladder: Unremarkable  Reproductive Organs: Unremarkable  Bowel: There are a few scattered sigmoid colon diverticula present. No evidence of diverticulitis. No bowel obstruction.  Vasculature: Atherosclerotic aortoiliac calcifications are noted. There may be stenosis of the right common iliac artery on image 91, series 2 and of the right external iliac artery on image 96.  Peritoneum / Retroperitoneum: No free fluid or free air.  Bones: There is a nonspecific sclerotic lesion in the sacrum on the left on image 99, series 2. Multilevel degenerative disc disease is noted between L3 and S1. There is a sclerotic lesion in the clavicular head on the left which is of doubtful significance.  IMPRESSION: Subcentimeter pulmonary nodules measuring up to 4 mm as detailed above. Comparison with any older outside studies is recommended.  Likely left renal cyst. Consider ultrasound.  Bilateral adrenal gland nodules. Comparison with outside studies is recommended. Metastatic disease is not excluded. Consider adrenal mass protocol CT or MRI for better characterization.  Nonspecific sclerotic sacral lesion which does not suggest metastatic disease.  LABS:      Latest Ref Rng & Units 10/27/2023   10:58 AM 10/07/2023    9:14 AM 09/21/2023   12:46 PM  CBC  WBC 4.0 - 10.5 K/uL 6.6  8.7  6.4   Hemoglobin 12.0 - 15.0 g/dL 16.1  09.6  04.5   Hematocrit 36.0 - 46.0 % 33.2  35.1  33.3   Platelets 150 - 400 K/uL 148  196  305       Latest Ref Rng & Units 10/27/2023   10:58 AM 10/07/2023    9:14 AM 09/21/2023   12:46 PM  CMP  Glucose 70 - 99 mg/dL 82  82  88   BUN 8 - 23 mg/dL 8  8  15    Creatinine 0.44 - 1.00 mg/dL 4.09  8.11  9.14    Sodium 135 - 145 mmol/L 139  139  138   Potassium 3.5 - 5.1 mmol/L 4.0  4.1  4.1   Chloride 98 - 111 mmol/L 104  101  102   CO2 22 - 32 mmol/L 25  27  26    Calcium 8.9 - 10.3 mg/dL 9.7  9.8  9.1   Total Protein 6.5 - 8.1 g/dL 7.1  7.4  6.5   Total Bilirubin 0.0 - 1.2 mg/dL <7.8  0.2  0.8   Alkaline Phos 38 - 126 U/L 119  131  94   AST 15 - 41 U/L 19  22  25    ALT 0 - 44 U/L 19  22  26     ASSESSMENT & PLAN:  A 63 y.o. female with extensive stage small cell lung cancer, including spread of disease to her liver and bones.  In clinic today, I went over all of her CT scan images with her, for which she could see that her large mediastinal mass has completely dissipated after 4 cycles of carboplatin /etoposide /atezolizumab .  There are no new sites of disease.  As she has had a positive response to her chemo/immunotherapy, her chemotherapy will now be discontinued.  Moving forward, she would just receive atezolizumab  once every 3 weeks for her maintenance immunotherapy.  Her first cycle will be given today.  I will see her back in 3 weeks before she heads into her second cycle of atezolizumab .  Of note, due to her left arm swelling, I will get a stat Doppler ultrasound of her left arm to ensure a DVT is not present.  My concern is there what may be one present and potentially related to the port that is in her left chest.  I will inform her of her Doppler ultrasound results as soon as they become available.  The patient understands all the plans discussed today and is in agreement with them.  Pamelia Botto Felicia Horde, MD

## 2023-11-18 ENCOUNTER — Inpatient Hospital Stay: Admitting: Oncology

## 2023-11-18 ENCOUNTER — Inpatient Hospital Stay

## 2023-11-18 ENCOUNTER — Encounter: Payer: Self-pay | Admitting: Oncology

## 2023-11-18 VITALS — HR 92

## 2023-11-18 VITALS — BP 135/83 | HR 109 | Temp 98.6°F | Resp 18 | Ht 64.0 in | Wt 175.0 lb

## 2023-11-18 DIAGNOSIS — Z452 Encounter for adjustment and management of vascular access device: Secondary | ICD-10-CM

## 2023-11-18 DIAGNOSIS — C3412 Malignant neoplasm of upper lobe, left bronchus or lung: Secondary | ICD-10-CM | POA: Diagnosis not present

## 2023-11-18 DIAGNOSIS — Z5112 Encounter for antineoplastic immunotherapy: Secondary | ICD-10-CM | POA: Diagnosis not present

## 2023-11-18 LAB — CMP (CANCER CENTER ONLY)
ALT: 23 U/L (ref 0–44)
AST: 24 U/L (ref 15–41)
Albumin: 4.2 g/dL (ref 3.5–5.0)
Alkaline Phosphatase: 114 U/L (ref 38–126)
Anion gap: 10 (ref 5–15)
BUN: 9 mg/dL (ref 8–23)
CO2: 27 mmol/L (ref 22–32)
Calcium: 10.4 mg/dL — ABNORMAL HIGH (ref 8.9–10.3)
Chloride: 103 mmol/L (ref 98–111)
Creatinine: 0.78 mg/dL (ref 0.44–1.00)
GFR, Estimated: >60 mL/min (ref >60)
Glucose, Bld: 91 mg/dL (ref 70–99)
Potassium: 4 mmol/L (ref 3.5–5.1)
Sodium: 140 mmol/L (ref 135–145)
Total Bilirubin: 0.3 mg/dL (ref 0.0–1.2)
Total Protein: 7.3 g/dL (ref 6.5–8.1)

## 2023-11-18 LAB — CBC WITH DIFFERENTIAL (CANCER CENTER ONLY)
Abs Immature Granulocytes: 0.02 10*3/uL (ref 0.00–0.07)
Basophils Absolute: 0 10*3/uL (ref 0.0–0.1)
Basophils Relative: 1 %
Eosinophils Absolute: 1 10*3/uL — ABNORMAL HIGH (ref 0.0–0.5)
Eosinophils Relative: 12 %
HCT: 41.1 % (ref 36.0–46.0)
Hemoglobin: 13.3 g/dL (ref 12.0–15.0)
Immature Granulocytes: 0 %
Lymphocytes Relative: 15 %
Lymphs Abs: 1.2 10*3/uL (ref 0.7–4.0)
MCH: 32.1 pg (ref 26.0–34.0)
MCHC: 32.4 g/dL (ref 30.0–36.0)
MCV: 99.3 fL (ref 80.0–100.0)
Monocytes Absolute: 0.5 10*3/uL (ref 0.1–1.0)
Monocytes Relative: 6 %
Neutro Abs: 5.5 10*3/uL (ref 1.7–7.7)
Neutrophils Relative %: 66 %
Platelet Count: 166 10*3/uL (ref 150–400)
RBC: 4.14 MIL/uL (ref 3.87–5.11)
RDW: 13.9 % (ref 11.5–15.5)
WBC Count: 8.2 10*3/uL (ref 4.0–10.5)
nRBC: 0 % (ref 0.0–0.2)
nRBC: 0 /100{WBCs}

## 2023-11-18 LAB — TSH: TSH: 1.738 u[IU]/mL (ref 0.350–4.500)

## 2023-11-18 MED ORDER — ALTEPLASE 2 MG IJ SOLR
2.0000 mg | Freq: Once | INTRAMUSCULAR | Status: AC
  Administered 2023-11-18: 2 mg
  Filled 2023-11-18: qty 2

## 2023-11-18 MED ORDER — SODIUM CHLORIDE 0.9 % IV SOLN: 1200.0000 mg | Freq: Once | INTRAVENOUS | Status: DC

## 2023-11-18 MED ORDER — SODIUM CHLORIDE 0.9 % IV SOLN: INTRAVENOUS | Status: DC

## 2023-11-18 MED ORDER — ALTEPLASE 2 MG IJ SOLR
2.0000 mg | Freq: Once | INTRAMUSCULAR | Status: AC | PRN
  Administered 2023-11-18: 2 mg

## 2023-11-18 NOTE — Progress Notes (Signed)
 1143 NO BLOOD RETURNED. PORT FLUSHES WELL. TRIED REPOSITIONING AND COUGHING. ALTEPLASE INSTILLED. PATIENT WAS TERRIFIED TO GET HER LABS DRAWN IN THE ARM. WE ARE WAITING FOR PORT TO GIVE BLOOD. 1240 NO BLOOD RETURNED SO FAR. FLUSHES WELL. 1333 NO BLOOD RETURNED. WILL HOLD TECENTRIQ  UNTIL TOMORROW. ALTEPLASE WILL INSTILL OVER 24 HOURS. WE HAD A SLIGHT PINK TINGED FLASH. NO BLOOD RETURNED THOUGH. THE PATIENT WAS OKAY WITH COMING IN ON TOMORROW AT 1030 TO GET THE TECENTRIQ . SUSAN PHY, PHARMD AND JENNIFER LEDNUM, RN AWARE AND AGREES WITH THE ABOVE.

## 2023-11-19 ENCOUNTER — Other Ambulatory Visit: Payer: Self-pay

## 2023-11-19 ENCOUNTER — Other Ambulatory Visit: Payer: Self-pay | Admitting: Hematology and Oncology

## 2023-11-19 ENCOUNTER — Encounter: Payer: Self-pay | Admitting: Oncology

## 2023-11-19 ENCOUNTER — Inpatient Hospital Stay: Attending: Oncology

## 2023-11-19 VITALS — BP 141/71 | HR 95 | Temp 98.2°F | Resp 18 | Ht 64.0 in | Wt 174.0 lb

## 2023-11-19 DIAGNOSIS — C3412 Malignant neoplasm of upper lobe, left bronchus or lung: Secondary | ICD-10-CM

## 2023-11-19 DIAGNOSIS — C787 Secondary malignant neoplasm of liver and intrahepatic bile duct: Secondary | ICD-10-CM | POA: Insufficient documentation

## 2023-11-19 DIAGNOSIS — Z5112 Encounter for antineoplastic immunotherapy: Secondary | ICD-10-CM | POA: Insufficient documentation

## 2023-11-19 DIAGNOSIS — Z79620 Long term (current) use of immunosuppressive biologic: Secondary | ICD-10-CM | POA: Insufficient documentation

## 2023-11-19 DIAGNOSIS — C7951 Secondary malignant neoplasm of bone: Secondary | ICD-10-CM | POA: Diagnosis not present

## 2023-11-19 DIAGNOSIS — C3492 Malignant neoplasm of unspecified part of left bronchus or lung: Secondary | ICD-10-CM | POA: Diagnosis present

## 2023-11-19 LAB — T4: T4, Total: 6.9 ug/dL (ref 4.5–12.0)

## 2023-11-19 MED ORDER — SODIUM CHLORIDE 0.9 % IV SOLN
1200.0000 mg | Freq: Once | INTRAVENOUS | Status: AC
  Administered 2023-11-19: 1200 mg via INTRAVENOUS
  Filled 2023-11-19: qty 20

## 2023-11-19 MED ORDER — SODIUM CHLORIDE 0.9 % IV SOLN: INTRAVENOUS | Status: DC

## 2023-11-19 NOTE — Progress Notes (Signed)
 PAC CONTINUES TO HAVE NO BLOOD RETURN.  STARTED PERIPHERAL IV TODAY FOR IMMUNOTHERAPY ADMIN.  PT IS TO GO TO [place] NEXT TUESDAY FOR DYE STUDY.  PT AWARE.

## 2023-11-19 NOTE — Patient Instructions (Signed)
 Atezolizumab Injection What is this medication? ATEZOLIZUMAB (a te zoe LIZ ue mab) treats some types of cancer. It works by helping your immune system slow or stop the spread of cancer cells. It is a monoclonal antibody. This medicine may be used for other purposes; ask your health care provider or pharmacist if you have questions. COMMON BRAND NAME(S): Tecentriq What should I tell my care team before I take this medication? They need to know if you have any of these conditions: Allogeneic stem cell transplant (uses someone else's stem cells) Autoimmune diseases, such as Crohn disease, ulcerative colitis, lupus History of chest radiation Nervous system problems, such as Guillain-Barre syndrome, myasthenia gravis Organ transplant An unusual or allergic reaction to atezolizumab, other medications, foods, dyes, or preservatives Pregnant or trying to get pregnant Breast-feeding How should I use this medication? This medication is injected into a vein. It is given by your care team in a hospital or clinic setting. A special MedGuide will be given to you before each treatment. Be sure to read this information carefully each time. Talk to your care team about the use of this medication in children. While it may be prescribed for children as young as 2 years for selected conditions, precautions do apply. Overdosage: If you think you have taken too much of this medicine contact a poison control center or emergency room at once. NOTE: This medicine is only for you. Do not share this medicine with others. What if I miss a dose? Keep appointments for follow-up doses. It is important not to miss your dose. Call your care team if you are unable to keep an appointment. What may interact with this medication? Interactions have not been studied. This list may not describe all possible interactions. Give your health care provider a list of all the medicines, herbs, non-prescription drugs, or dietary  supplements you use. Also tell them if you smoke, drink alcohol, or use illegal drugs. Some items may interact with your medicine. What should I watch for while using this medication? Your condition will be monitored carefully while you are receiving this medication. You may need blood work done while you are taking this medication. This medication may cause serious skin reactions. They can happen weeks to months after starting the medication. Contact your care team right away if you notice fevers or flu-like symptoms with a rash. The rash may be red or purple and then turn into blisters or peeling of the skin. You may also notice a red rash with swelling of the face, lips, or lymph nodes in your neck or under your arms. Talk to your care team if you may be pregnant. Serious birth defects can occur if you take this medication during pregnancy and for 5 months after the last dose. You will need a negative pregnancy test before starting this medication. Contraception is recommended while taking this medication and for 5 months after the last dose. Your care team can help you find the option that works for you. Do not breastfeed while taking this medication and for 5 months after the last dose. This medication may cause infertility. Talk to your care team if you are concerned about your fertility. What side effects may I notice from receiving this medication? Side effects that you should report to your doctor or health care professional as soon as possible: Allergic reactions--skin rash, itching, hives, swelling of the face, lips, tongue, or throat Dry cough, shortness of breath or trouble breathing Eye pain, redness, irritation, or discharge  with blurry or decreased vision Heart muscle inflammation--unusual weakness or fatigue, shortness of breath, chest pain, fast or irregular heartbeat, dizziness, swelling of the ankles, feet, or hands Hormone gland problems--headache, sensitivity to light, unusual  weakness or fatigue, dizziness, fast or irregular heartbeat, increased sensitivity to cold or heat, excessive sweating, constipation, hair loss, increased thirst or amount of urine, tremors or shaking, irritability Infusion reactions--chest pain, shortness of breath or trouble breathing, feeling faint or lightheaded Kidney injury (glomerulonephritis)--decrease in the amount of urine, red or dark brown urine, foamy or bubbly urine, swelling of the ankles, hands, or feet Liver injury--right upper belly pain, loss of appetite, nausea, light-colored stool, dark yellow or brown urine, yellowing skin or eyes, unusual weakness or fatigue Pain, tingling, or numbness in the hands or feet, muscle weakness, change in vision, confusion or trouble speaking, loss of balance or coordination, trouble walking, seizures Rash, fever, and swollen lymph nodes Redness, blistering, peeling, or loosening of the skin, including inside the mouth Sudden or severe stomach pain, bloody diarrhea, fever, nausea, vomiting Side effects that usually do not require medical attention (report to your doctor or health care professional if they continue or are bothersome): Bone, joint, or muscle pain Diarrhea Fatigue Loss of appetite Nausea Skin rash This list may not describe all possible side effects. Call your doctor for medical advice about side effects. You may report side effects to FDA at 1-800-FDA-1088. Where should I keep my medication? This medication is given in a hospital or clinic. It will not be stored at home. NOTE: This sheet is a summary. It may not cover all possible information. If you have questions about this medicine, talk to your doctor, pharmacist, or health care provider.  2024 Elsevier/Gold Standard (2023-04-22 00:00:00)

## 2023-11-24 ENCOUNTER — Other Ambulatory Visit (HOSPITAL_COMMUNITY): Payer: Self-pay | Admitting: Interventional Radiology

## 2023-11-24 ENCOUNTER — Ambulatory Visit (HOSPITAL_COMMUNITY)
Admission: RE | Admit: 2023-11-24 | Discharge: 2023-11-24 | Disposition: A | Discharge status: Home / Self Care | Source: Ambulatory Visit | Attending: Hematology and Oncology | Admitting: Hematology and Oncology

## 2023-11-24 DIAGNOSIS — Z452 Encounter for adjustment and management of vascular access device: Secondary | ICD-10-CM | POA: Diagnosis present

## 2023-11-24 DIAGNOSIS — C3412 Malignant neoplasm of upper lobe, left bronchus or lung: Secondary | ICD-10-CM | POA: Diagnosis not present

## 2023-11-24 HISTORY — PX: IR CV LINE INJECTION: IMG2294

## 2023-11-24 MED ORDER — HEPARIN SOD (PORK) LOCK FLUSH 100 UNIT/ML IV SOLN
INTRAVENOUS | Status: AC
  Filled 2023-11-24: qty 5

## 2023-11-24 MED ORDER — IOHEXOL 300 MG/ML  SOLN
50.0000 mL | Freq: Once | INTRAMUSCULAR | Status: AC | PRN
  Administered 2023-11-24: 15 mL via INTRAVENOUS

## 2023-11-25 ENCOUNTER — Inpatient Hospital Stay

## 2023-11-25 NOTE — Progress Notes (Signed)
  [facility] CSW Progress Note  Clinical Child psychotherapist contacted patient by phone to provide updates on [facility] Contact. Patient stated CAP paperwork was given to provider, patient uncertain if paperwork has been faxed. Patient became distressed towards end of call stating she was overwhelmed and felt alone. Patient expressed frustration that her psychiatric provider has not returned her calls and expressed need for anxiety medication. Patient has third port sx on Friday and began to cry when discussing it. CSW walked patient through deep breathing. Patient stated "I can't handle this. If I do not get my medicine I will Rosette Console myself", CSW paused call to complete assessment below. Patient clarified that she does not want to kill herself but often feels overwhelmed. Patient has protective factor her dog and neighbors near by. Patient denied any plan or intent to kill herself. CSW contacted patient's psychiatric provider [facility] and requested agency call patient back. Telephone F/U scheduled for 5/08. Medical team updated.   Maudie Sorrow, LCSW Clinical Social Worker [facility]  [phone]  Patient is participating in a Managed Medicaid Plan:  Yes   [facility] Clinical Social Work  Suicide Risk Assessment   Risk assessment completed by provider due to suicidal statements being made.   Current Mental Status: Suicidal ideation and No plan to harm self or others Loss Factors: Medical Changes and feelings of being overwhelmed Demographic factors:  Divorced or widowed, Caucasian, and Living alone Historical Factors: Mental Health Challenges Risk Reduction Factors: Patient's dog is important to her and serves as primary source of support. Patient is able to discuss future plans and appointments.   CLINICAL FACTORS:  Severe Anxiety and/or Agitation  SUICIDE RISK:  Mild:  Suicidal ideation of limited frequency, intensity, duration, and specificity.  There are no  identifiable plans, no associated intent, mild dysphoria and related symptoms, good self-control (both objective and subjective assessment), few other risk factors, and identifiable protective factors, including available and accessible social support.   PLAN OF CARE:  CSW will contact patient on 5/08 CSW and patient completed safety contract as follows: 2. Internal coping strategies: taking deep breaths to slow down anxious thoughts, 3. People and places that provide distraction: pets and neighbors, and 5. Professionals/ agencies I can contact: Call [phone], Call [phone] Derrel Flies), text [phone], Local ED, Local Urgent Care, Psychiatric Provider and Crisis resources given. Pt agreed to call resources as needed   Maudie Sorrow, LCSW   Suicide Resources  Who to Call Call 911 National Suicide Prevention Hotline - Dial (562)887-9151 Crisis Text Line:  text SAVE to 0011001100 [facility]:  ([phone] [phone] or 650 175 1987 [facility]: [phone] or walk-in to [address] [place], [place] [address] [facility] Mobile Crisis: [phone]   More Resources Suicide Awareness Voices of Education       (989)578-8649        www.save.org [employer] on Mental Illness(NAMI)       (800) 950-NAMI (6264)        www.nami.org American Association of Suicidology       (623)543-4202        www.suicidology.org

## 2023-11-26 ENCOUNTER — Ambulatory Visit
Admission: RE | Admit: 2023-11-26 | Discharge: 2023-11-26 | Disposition: A | Discharge status: Home / Self Care | Source: Ambulatory Visit | Attending: Physician Assistant | Admitting: Physician Assistant

## 2023-11-26 ENCOUNTER — Telehealth: Payer: Self-pay

## 2023-11-26 ENCOUNTER — Other Ambulatory Visit (HOSPITAL_BASED_OUTPATIENT_CLINIC_OR_DEPARTMENT_OTHER): Admitting: Radiology

## 2023-11-26 ENCOUNTER — Other Ambulatory Visit: Payer: Self-pay | Admitting: Radiology

## 2023-11-26 DIAGNOSIS — Z1231 Encounter for screening mammogram for malignant neoplasm of breast: Secondary | ICD-10-CM

## 2023-11-26 NOTE — H&P (Addendum)
 Chief Complaint: Left-sided chest Port-A-Cath malfunction/fibrin sheath/poor blood return, extensive stage small cell lung cancer; referred for Port-A-Cath stripping, possible new port a cath placement  Referring Provider(s): Lewis,D  Supervising Physician: Babcock,G  Patient Status: [facility] - Out-pt  History of Present Illness: Dana Roth is a 63 y.o. female smoker with past medical history of arthritis, depression, hyperlipidemia, GERD, hypertension, multiple osteochondroma, and extensive stage small cell lung cancer.  She has a history of surgically placed port a cath placement on 07/17/2023 with subsequent malfunctioning / kinking and attempted removal at [facility] on 09/02/2023.  Unfortunately the catheter fractured and a length of the catheter remained behind and was unable to be extracted in the OR.  Patient was subsequently sent to [facility] and underwent removal of intravascular retained catheter fragment by IR team same day.  Patient is status post left chest Port-A-Cath placement at [facility] on 09/14/2023 and had no blood return on attempted use on 11/19/2023.  She underwent Port-A-Cath injection at [facility] on 5/6 which showed a fibrin sheath obstructing flow but otherwise intact Port-A-Cath.  She is scheduled today for Port-A-Cath stripping, or possible new port a cath placement.   Patient is Full Code  Past Medical History:  Diagnosis Date   Arthritis    Complication of anesthesia    problem with B/P dropping   Depression    Dyslipidemia    GERD (gastroesophageal reflux disease)    HTN (hypertension)    "only with MD visits, caused by anxiety",med taken caused chest pain-so stopped.   Multiple osteochondroma    Multiple bone surgeries   Needle phobia    PONV (postoperative nausea and vomiting)    Rectal bleeding    Small cell lung cancer (HCC)    Suspicious mole     Past Surgical History:  Procedure Laterality Date   bone tumors      Multiple excisions-all over body "occ. bones lock up"   BRONCHIAL BIOPSY  06/30/2023   Procedure: BRONCHIAL BIOPSIES;  Surgeon: Prudy Brownie, DO;  Location: [facility] ENDOSCOPY;  Service: Pulmonary;;   CHOLECYSTECTOMY     COLONOSCOPY WITH PROPOFOL  N/A 06/02/2013   Procedure: COLONOSCOPY WITH PROPOFOL ;  Surgeon: Yvetta Herbert, MD;  Location: [facility] ENDOSCOPY;  Service: Endoscopy;  Laterality: N/A;   COLONOSCOPY WITH PROPOFOL  N/A 05/07/2017   Procedure: COLONOSCOPY WITH PROPOFOL ;  Surgeon: Baldo Bonds, MD;  Location: [facility] ENDOSCOPY;  Service: Endoscopy;  Laterality: N/A;   HEMOSTASIS CONTROL  06/30/2023   Procedure: HEMOSTASIS CONTROL;  Surgeon: Prudy Brownie, DO;  Location: [facility] ENDOSCOPY;  Service: Pulmonary;;   HOT HEMOSTASIS N/A 05/07/2017   Procedure: HOT HEMOSTASIS (ARGON PLASMA COAGULATION/BICAP);  Surgeon: Baldo Bonds, MD;  Location: [facility] ENDOSCOPY;  Service: Endoscopy;  Laterality: N/A;   IR CV LINE INJECTION  11/24/2023   IR TRANSCATH RETRIEVAL FB INCL GUIDANCE (MS)  09/02/2023   IR US  GUIDE VASC ACCESS RIGHT  09/02/2023   IR VENO/JUGULAR RIGHT  09/02/2023   PORTA CATH INSERTION     VIDEO BRONCHOSCOPY N/A 06/30/2023   Procedure: VIDEO BRONCHOSCOPY;  Surgeon: Prudy Brownie, DO;  Location: [facility] ENDOSCOPY;  Service: Pulmonary;  Laterality: N/A;    Allergies: Codeine  Medications: Prior to Admission medications   Medication Sig Start Date End Date Taking? Authorizing Provider  albuterol  (VENTOLIN  HFA) 108 (90 Base) MCG/ACT inhaler Inhale 2 puffs into the lungs every 6 (six) hours as needed for wheezing or shortness of breath. 08/26/23   Deloria Fetch, MD  budesonide -formoterol  (SYMBICORT ) 80-4.5 MCG/ACT inhaler Inhale 2 puffs into the lungs 2 (two) times daily. 09/04/23   Raejean Bullock, NP  busPIRone (BUSPAR) 10 MG tablet     [provider]  cetirizine (ZYRTEC) 10 MG tablet Take 10 mg by mouth daily. 11/10/23   [provider]  clonazePAM (KLONOPIN)  0.5 MG tablet Take 0.5 mg by mouth 3 (three) times daily as needed.    [provider]  cyclobenzaprine (FLEXERIL) 5 MG tablet     [provider]  Fluticasone-Umeclidin-Vilant (TRELEGY ELLIPTA ) 100-62.5-25 MCG/ACT AEPB Inhale 1 puff into the lungs daily. 09/01/23   Raejean Bullock, NP  gabapentin (NEURONTIN) 400 MG capsule     [provider]  HYDROcodone -acetaminophen  (NORCO) 7.5-325 MG tablet Take 1 tablet by mouth every 6 (six) hours as needed for moderate pain (pain score 4-6). 08/26/23   Lewis, Dequincy A, MD  ipratropium-albuterol  (DUONEB) 0.5-2.5 (3) MG/3ML SOLN Take 3 mLs by nebulization every 6 (six) hours as needed. 10/15/23   Lewis, Dequincy A, MD  loratadine (CLARITIN) 10 MG tablet Take 10 mg by mouth daily. For 5 days, starting day she receives WBC injection. Repeat each cycle. 08/10/23   [provider]  meloxicam (MOBIC) 15 MG tablet     [provider]  nicotine  (NICODERM CQ  - DOSED IN MG/24 HOURS) 14 mg/24hr patch Place 14 mg onto the skin daily.    [provider]  ondansetron  (ZOFRAN -ODT) 4 MG disintegrating tablet Take 1 tablet (4 mg total) by mouth every 6 (six) hours as needed for nausea or vomiting. 08/31/23   Ciro Cress A, MD  polyethylene glycol powder (MIRALAX) 17 GM/SCOOP powder  10/15/21   [provider]  prochlorperazine  (COMPAZINE ) 10 MG tablet Take 1 tablet (10 mg total) by mouth every 6 (six) hours as needed for nausea or vomiting. 08/31/23   Harles Lied, Dequincy A, MD  sennosides-docusate sodium (SENOKOT-S) 8.6-50 MG tablet Take 2 tablets by mouth in the morning and at bedtime. 08/07/23   [provider]  tiZANidine (ZANAFLEX) 4 MG capsule Take 4 mg by mouth 3 (three) times daily as needed for muscle spasms.    [provider]     Family History  Problem Relation Age of Onset   Heart disease Brother        "Hole in heart"   CAD Sister     Social History   Socioeconomic History   Marital  status: Divorced    Spouse name: Not on file   Number of children: 0   Years of education: 8   Highest education level: Not on file  Occupational History    Comment: [employer]   Occupation: DISABLED  Tobacco Use   Smoking status: Every Day    Current packs/day: 1.00    Average packs/day: 1 pack/day for 40.0 years (40.0 ttl pk-yrs)    Types: Cigarettes   Smokeless tobacco: Never  Vaping Use   Vaping status: Former  Substance and Sexual Activity   Alcohol use: No   Drug use: Yes    Frequency: 7.0 times per week    Types: Marijuana    Comment: pt smoke marijuana everyday, last used 04/1717   Sexual activity: Not Currently  Other Topics Concern   Not on file  Social History Narrative   Not on file   Social Drivers of Health   Financial Resource Strain: Not on file  Food Insecurity: Food Insecurity Present (07/09/2023)   Hunger Vital Sign  Worried About Programme researcher, broadcasting/film/video in the Last Year: Sometimes true    [employer] of Food in the Last Year: Sometimes true  Transportation Needs: No Transportation Needs (07/09/2023)   PRAPARE - Administrator, Civil Service (Medical): No    Lack of Transportation (Non-Medical): No  Physical Activity: Not on file  Stress: Not on file  Social Connections: Not on file       Review of Systems denies fever, headache, nausea, vomiting; she does have some occasional left-sided chest discomfort, dyspnea with exertion, occasional cough, abdominal/back discomfort, occasional hemorrhoidal bleeding.  She is anxious.  Vital Signs: Vitals:   11/27/23 0746  BP: (!) 140/69  Pulse: 97  Resp: 19  Temp: 98.2 F (36.8 C)  SpO2: 99%      Advance Care Plan: No documents on file  Physical Exam awake, alert.  Chest with distant breath sounds bilaterally.  Intact left chest wall Port-A-Cath.  Heart with regular rate and rhythm.  Abdomen soft, positive bowel sounds, tender epigastric region.  No lower extremity  edema  Imaging: IR CV Line Injection Result Date: 11/24/2023 INDICATION: Left-sided chest port malfunction. No blood return on attempted use 11/19/2023. EXAM: Poor access and contrast injection under fluoroscopy MEDICATIONS: None ANESTHESIA/SEDATION: None COMPLICATIONS: None immediate. PROCEDURE: The left-sided chest port was evaluated under fluoroscopy and found to be intact. Contrast was then injected after the chest port was accessed with a Huber needle. Contrast can be seen flowing into the reservoir, port catheter, and external to the catheter. Contrast is not easily flow away from the catheter tip is there is a fibrin sheath and filling defect at the tip of the catheter. Contrast then does flow into the SVC and subsequently into the right atrium, right ventricle and pulmonary outflow tract. IMPRESSION: Fibrin sheath obstructing flow and otherwise intact and functioning single-lumen chest port. The patient will be scheduled for catheter stripping. Electronically Signed   By: Susan Ensign   On: 11/24/2023 14:39    Labs:  CBC: Recent Labs    09/21/23 1246 10/07/23 0914 10/27/23 1058 11/18/23 1240  WBC 6.4 8.7 6.6 8.2  HGB 11.0* 11.3* 10.5* 13.3  HCT 33.3* 35.1* 33.2* 41.1  PLT 305 196 148* 166    COAGS: No results for input(s): "INR", "APTT" in the last 8760 hours.  BMP: Recent Labs    09/21/23 1246 10/07/23 0914 10/22/23 1017 10/27/23 1058 11/18/23 1240  NA 138 139  --  139 140  K 4.1 4.1  --  4.0 4.0  CL 102 101  --  104 103  CO2 26 27  --  25 27  GLUCOSE 88 82  --  82 91  BUN 15 8  --  8 9  CALCIUM 9.1 9.8  --  9.7 10.4*  CREATININE 0.58 0.65  --  0.62 0.78  GFRNONAA >60 >60 106 >60 >60    LIVER FUNCTION TESTS: Recent Labs    09/21/23 1246 10/07/23 0914 10/27/23 1058 11/18/23 1240  BILITOT 0.8 0.2 <0.2 0.3  AST 25 22 19 24   ALT 26 22 19 23   ALKPHOS 94 131* 119 114  PROT 6.5 7.4 7.1 7.3  ALBUMIN 3.9 4.6 4.5 4.2    TUMOR MARKERS: No results for  input(s): "AFPTM", "CEA", "CA199", "CHROMGRNA" in the last 8760 hours.  Assessment and Plan: 63 y.o. female smoker with past medical history of arthritis, depression, hyperlipidemia, GERD, hypertension, multiple osteochondroma, and extensive stage small cell lung cancer.  She has  a history of surgically placed port a cath placement on 07/17/2023 with subsequent malfunctioning / kinking and attempted removal at [facility] on 09/02/2023.  Unfortunately the catheter fractured and a length of the catheter remained behind and was unable to be extracted in the OR.  Patient was subsequently sent to [facility] and underwent removal of intravascular retained catheter fragment by IR team same day.  Patient is status post left chest Port-A-Cath placement at [facility] on 09/14/2023 and had no blood return on attempted use on 11/19/2023.  She underwent Port-A-Cath injection at [facility] on 5/6 which showed a fibrin sheath obstructing flow but otherwise intact Port-A-Cath.  She is scheduled today for Port-A-Cath stripping or possible new Port-A-Cath placement.  Details/risks of procedure, including but not limited to, internal bleeding, infection, injury to adjacent structures, catheter fracture/displacement discussed with the patient with her understanding and consent.   Thank you for allowing our service to participate in Dana Roth 's care.  Electronically Signed: D. Honore Lux, PA-C   11/26/2023, 4:26 PM      I spent a total of    25 Minutes in face to face in clinical consultation, greater than 50% of which was counseling/coordinating care for Port-A-Cath fibrin sheath stripping

## 2023-11-26 NOTE — Telephone Encounter (Signed)
 [facility] CSW Progress Note  Clinical Social Worker contacted patient by as scheduled following 5/07 appointment. Patient did not answer phone call. Per appt desk Patient arrived to scheduled  imaging appointment.   Maudie Sorrow, LCSW Clinical Social Worker [facility]

## 2023-11-27 ENCOUNTER — Encounter (HOSPITAL_COMMUNITY): Payer: Self-pay

## 2023-11-27 ENCOUNTER — Ambulatory Visit (HOSPITAL_COMMUNITY)
Admission: RE | Admit: 2023-11-27 | Discharge: 2023-11-27 | Disposition: A | Discharge status: Home / Self Care | Source: Ambulatory Visit | Attending: Interventional Radiology | Admitting: Interventional Radiology

## 2023-11-27 ENCOUNTER — Other Ambulatory Visit (HOSPITAL_COMMUNITY): Payer: Self-pay | Admitting: Interventional Radiology

## 2023-11-27 ENCOUNTER — Ambulatory Visit (HOSPITAL_COMMUNITY)
Admission: RE | Admit: 2023-11-27 | Discharge: 2023-11-27 | Disposition: A | Discharge status: Home / Self Care | Source: Ambulatory Visit | Attending: Interventional Radiology

## 2023-11-27 ENCOUNTER — Other Ambulatory Visit: Payer: Self-pay

## 2023-11-27 DIAGNOSIS — I82890 Acute embolism and thrombosis of other specified veins: Secondary | ICD-10-CM | POA: Insufficient documentation

## 2023-11-27 DIAGNOSIS — C3412 Malignant neoplasm of upper lobe, left bronchus or lung: Secondary | ICD-10-CM

## 2023-11-27 DIAGNOSIS — F17210 Nicotine dependence, cigarettes, uncomplicated: Secondary | ICD-10-CM | POA: Insufficient documentation

## 2023-11-27 DIAGNOSIS — T859XXA Unspecified complication of internal prosthetic device, implant and graft, initial encounter: Secondary | ICD-10-CM | POA: Insufficient documentation

## 2023-11-27 HISTORY — PX: IR REMOVE CV FIBRIN SHEATH: IMG699

## 2023-11-27 HISTORY — PX: IR IMAGING GUIDED PORT INSERTION: IMG5740

## 2023-11-27 HISTORY — PX: IR REMOVAL TUN ACCESS W/ PORT W/O FL MOD SED: IMG2290

## 2023-11-27 MED ORDER — FENTANYL CITRATE (PF) 100 MCG/2ML IJ SOLN
INTRAMUSCULAR | Status: AC
  Filled 2023-11-27: qty 2

## 2023-11-27 MED ORDER — CEFAZOLIN SODIUM-DEXTROSE 2-4 GM/100ML-% IV SOLN
INTRAVENOUS | Status: DC | PRN
  Administered 2023-11-27: 2 g via INTRAVENOUS

## 2023-11-27 MED ORDER — IOHEXOL 300 MG/ML  SOLN
50.0000 mL | Freq: Once | INTRAMUSCULAR | Status: DC | PRN
Start: 2023-11-27 — End: 2023-11-28

## 2023-11-27 MED ORDER — LIDOCAINE HCL 1 % IJ SOLN
20.0000 mL | Freq: Once | INTRAMUSCULAR | Status: AC
  Administered 2023-11-27: 20 mL via INTRADERMAL

## 2023-11-27 MED ORDER — MIDAZOLAM HCL 2 MG/2ML IJ SOLN
INTRAMUSCULAR | Status: AC
  Filled 2023-11-27: qty 2

## 2023-11-27 MED ORDER — HEPARIN SOD (PORK) LOCK FLUSH 100 UNIT/ML IV SOLN
INTRAVENOUS | Status: AC
  Filled 2023-11-27: qty 5

## 2023-11-27 MED ORDER — HEPARIN SOD (PORK) LOCK FLUSH 100 UNIT/ML IV SOLN
500.0000 [IU] | Freq: Once | INTRAVENOUS | Status: AC
  Administered 2023-11-27: 500 [IU] via INTRAVENOUS

## 2023-11-27 MED ORDER — MIDAZOLAM HCL 5 MG/5ML IJ SOLN: INTRAMUSCULAR | Status: DC | PRN

## 2023-11-27 MED ORDER — DIPHENHYDRAMINE HCL 50 MG/ML IJ SOLN
INTRAMUSCULAR | Status: DC | PRN
  Administered 2023-11-27: 25 mg via INTRAVENOUS

## 2023-11-27 MED ORDER — LIDOCAINE HCL 1 % IJ SOLN
INTRAMUSCULAR | Status: AC
  Filled 2023-11-27: qty 20

## 2023-11-27 MED ORDER — MIDAZOLAM HCL 2 MG/2ML IJ SOLN
INTRAMUSCULAR | Status: DC | PRN
  Administered 2023-11-27 (×8): 1 mg via INTRAVENOUS

## 2023-11-27 MED ORDER — CEFAZOLIN SODIUM-DEXTROSE 2-4 GM/100ML-% IV SOLN
INTRAVENOUS | Status: AC
  Filled 2023-11-27: qty 100

## 2023-11-27 MED ORDER — SODIUM CHLORIDE 0.9 % IV SOLN: INTRAVENOUS | Status: DC

## 2023-11-27 MED ORDER — FENTANYL CITRATE (PF) 100 MCG/2ML IJ SOLN
INTRAMUSCULAR | Status: DC | PRN
  Administered 2023-11-27 (×4): 50 ug via INTRAVENOUS

## 2023-11-27 MED ORDER — DIPHENHYDRAMINE HCL 50 MG/ML IJ SOLN
INTRAMUSCULAR | Status: AC
  Filled 2023-11-27: qty 1

## 2023-11-27 MED ORDER — IOHEXOL 300 MG/ML  SOLN
50.0000 mL | Freq: Once | INTRAMUSCULAR | Status: AC | PRN
Start: 2023-11-27 — End: 2023-11-27
  Administered 2023-11-27: 15 mL via INTRAVENOUS

## 2023-11-27 NOTE — Procedures (Signed)
 Interventional Radiology Procedure Note  Procedure: RIJ SL chest port placement, left sided port removal, vein stripping  Complications: None  Estimated Blood Loss: < 10 mL  Findings: RCFV access for vein stripping.  RIJ access for port placement.  Left subclavian port removed  Rosetta Cons, MD

## 2023-11-27 NOTE — Discharge Instructions (Addendum)
 Implanted Port Insertion, Care After  The following information offers guidance on how to care for yourself after your procedure. Your health care provider may also give you more specific instructions. If you have problems or questions, contact your health care provider.  What can I expect after the procedure? After the procedure, it is common to have: Discomfort at the port insertion site. Bruising on the skin over the port. This should improve over 3-4 days.   Urgent needs - Interventional Radiology, clinic [phone] (mon-fri 8-5).   Wound - May remove dressing and shower in 24 to 48 hours.  Keep site clean and dry.  Replace with bandaid as needed.  Do not submerge in tub or water until site healing well. If closed with glue, glue will flake off on its own.   If ordered by your provider, may start Emla cream (or any other creams ointments or lotions) in 2 weeks or after incision is healed. Port is ready for use immediately.   After completion of treatment, your provider should have you set up for monthly port flushes.   Follow these instructions at home: [facility] care After your port is placed, you will get a manufacturer's information card. The card has information about your port. Keep this card with you at all times. Take care of the port as told by your health care provider. Ask your health care provider if you or a family member can get training for taking care of the port at home. A home health care nurse will be be available to help care for the port. Make sure to remember what type of port you have. Incision care     Follow instructions from your health care provider about how to take care of your port insertion site. Make sure you: Wash your hands with soap and water for at least 20 seconds before and after you change your bandage (dressing). If soap and water are not available, use hand sanitizer. Change your dressing as told by your health care provider. Leave stitches  (sutures), skin glue, or adhesive strips in place. These skin closures may need to stay in place for 2 weeks or longer. If adhesive strip edges start to loosen and curl up, you may trim the loose edges. Do not remove adhesive strips completely unless your health care provider tells you to do that. Check your port insertion site every day for signs of infection. Check for: Redness, swelling, or pain. Fluid or blood. Warmth. Pus or a bad smell. Activity Return to your normal activities as told by your health care provider. Ask your health care provider what activities are safe for you. You may have to avoid lifting. Ask your health care provider how much you can safely lift. General instructions Take over-the-counter and prescription medicines only as told by your health care provider. Do not take baths, swim, or use a hot tub until your health care provider approves. Ask your health care provider if you may take showers. You may only be allowed to take sponge baths. If you were given a sedative during the procedure, it can affect you for several hours. Do not drive or operate machinery until your health care provider says that it is safe. Wear a medical alert bracelet in case of an emergency. This will tell any health care providers that you have a port. Keep all follow-up visits. This is important. Contact a health care provider if: You cannot flush your port with saline as directed, or you cannot  draw blood from the port. You have a fever or chills. You have redness, swelling, or pain around your port insertion site. You have fluid or blood coming from your port insertion site. Your port insertion site feels warm to the touch. You have pus or a bad smell coming from the port insertion site. Get help right away if: You have chest pain or shortness of breath. You have bleeding from your port that you cannot control. These symptoms may be an emergency. Get help right away. Call 911. Do not  wait to see if the symptoms will go away. Do not drive yourself to the hospital. Summary Take care of the port as told by your health care provider. Keep the manufacturer's information card with you at all times. Change your dressing as told by your health care provider. Contact a health care provider if you have a fever or chills or if you have redness, swelling, or pain around your port insertion site. Keep all follow-up visits. This information is not intended to replace advice given to you by your health care provider. Make sure you discuss any questions you have with your health care provider. Document Revised: 01/08/2021 Document Reviewed: 01/08/2021 Elsevier Patient Education  2023 Elsevier Inc.    Moderate Conscious Sedation  Adult  Care After (English)  After the procedure, it is common to have: Sleepiness for a few hours. Impaired judgment for a few hours. Trouble with balance. Nausea or vomiting if you eat too soon. Follow these instructions at home: For the time period you were told by your health care provider:  Rest. Do not participate in activities where you could fall or become injured. Do not drive or use machinery. Do not drink alcohol. Do not take sleeping pills or medicines that cause drowsiness. Do not make important decisions or sign legal documents. Do not take care of children on your own. Eating and drinking Follow instructions from your health care provider about what you may eat and drink. Drink enough fluid to keep your urine pale yellow. If you vomit: Drink clear fluids slowly and in small amounts as you are able. Clear fluids include water, ice chips, low-calorie sports drinks, and fruit juice that has water added to it (diluted fruit juice). Eat light and bland foods in small amounts as you are able. These foods include bananas, applesauce, rice, lean meats, toast, and crackers. General instructions Take over-the-counter and prescription medicines  only as told by your health care provider. Have a responsible adult stay with you for the time you are told. Do not use any products that contain nicotine or tobacco. These products include cigarettes, chewing tobacco, and vaping devices, such as e-cigarettes. If you need help quitting, ask your health care provider. Return to your normal activities as told by your health care provider. Ask your health care provider what activities are safe for you. Your health care provider may give you more instructions. Make sure you know what you can and cannot do. Contact a health care provider if: You are still sleepy or having trouble with balance after 24 hours. You feel light-headed. You vomit every time you eat or drink. You get a rash. You have a fever. You have redness or swelling around the IV site. Get help right away if: You have trouble breathing. You start to feel confused at home. These symptoms may be an emergency. Get help right away. Call 911. Do not wait to see if the symptoms will go away. Do not  drive yourself to the hospital. This information is not intended to replace advice given to you by your health care provider. Make sure you discuss any questions you have with your health care provider.

## 2023-12-09 ENCOUNTER — Other Ambulatory Visit: Payer: Self-pay

## 2023-12-09 ENCOUNTER — Inpatient Hospital Stay: Admitting: Hematology and Oncology

## 2023-12-09 ENCOUNTER — Inpatient Hospital Stay

## 2023-12-09 ENCOUNTER — Telehealth: Payer: Self-pay | Admitting: Hematology and Oncology

## 2023-12-09 VITALS — BP 130/82 | HR 94 | Temp 99.0°F | Resp 20 | Ht 64.0 in | Wt 171.3 lb

## 2023-12-09 DIAGNOSIS — C3412 Malignant neoplasm of upper lobe, left bronchus or lung: Secondary | ICD-10-CM

## 2023-12-09 DIAGNOSIS — Z5112 Encounter for antineoplastic immunotherapy: Secondary | ICD-10-CM | POA: Diagnosis not present

## 2023-12-09 LAB — CMP (CANCER CENTER ONLY)
ALT: 18 U/L (ref 0–44)
AST: 19 U/L (ref 15–41)
Albumin: 4.3 g/dL (ref 3.5–5.0)
Alkaline Phosphatase: 123 U/L (ref 38–126)
Anion gap: 13 (ref 5–15)
BUN: 12 mg/dL (ref 8–23)
CO2: 26 mmol/L (ref 22–32)
Calcium: 10.2 mg/dL (ref 8.9–10.3)
Chloride: 100 mmol/L (ref 98–111)
Creatinine: 0.75 mg/dL (ref 0.44–1.00)
GFR, Estimated: >60 mL/min (ref >60)
Glucose, Bld: 92 mg/dL (ref 70–99)
Potassium: 4.1 mmol/L (ref 3.5–5.1)
Sodium: 139 mmol/L (ref 135–145)
Total Bilirubin: 0.3 mg/dL (ref 0.0–1.2)
Total Protein: 7.5 g/dL (ref 6.5–8.1)

## 2023-12-09 LAB — CBC WITH DIFFERENTIAL (CANCER CENTER ONLY)
Abs Immature Granulocytes: 0.01 10*3/uL (ref 0.00–0.07)
Basophils Absolute: 0 10*3/uL (ref 0.0–0.1)
Basophils Relative: 1 %
Eosinophils Absolute: 0.4 10*3/uL (ref 0.0–0.5)
Eosinophils Relative: 5 %
HCT: 46.2 % — ABNORMAL HIGH (ref 36.0–46.0)
Hemoglobin: 14.9 g/dL (ref 12.0–15.0)
Immature Granulocytes: 0 %
Lymphocytes Relative: 20 %
Lymphs Abs: 1.4 10*3/uL (ref 0.7–4.0)
MCH: 30.6 pg (ref 26.0–34.0)
MCHC: 32.3 g/dL (ref 30.0–36.0)
MCV: 94.9 fL (ref 80.0–100.0)
Monocytes Absolute: 0.4 10*3/uL (ref 0.1–1.0)
Monocytes Relative: 6 %
Neutro Abs: 5.1 10*3/uL (ref 1.7–7.7)
Neutrophils Relative %: 68 %
Platelet Count: 170 10*3/uL (ref 150–400)
RBC: 4.87 MIL/uL (ref 3.87–5.11)
RDW: 12.1 % (ref 11.5–15.5)
WBC Count: 7.3 10*3/uL (ref 4.0–10.5)
nRBC: 0 % (ref 0.0–0.2)

## 2023-12-09 MED ORDER — SODIUM CHLORIDE 0.9 % IV SOLN
1200.0000 mg | Freq: Once | INTRAVENOUS | Status: AC
  Administered 2023-12-09: 1200 mg via INTRAVENOUS
  Filled 2023-12-09: qty 20

## 2023-12-09 MED ORDER — SODIUM CHLORIDE 0.9% FLUSH: 10.0000 mL | INTRAVENOUS | Status: DC | PRN

## 2023-12-09 MED ORDER — SODIUM CHLORIDE 0.9 % IV SOLN: INTRAVENOUS | Status: DC

## 2023-12-09 MED ORDER — HEPARIN SOD (PORK) LOCK FLUSH 100 UNIT/ML IV SOLN: 500.0000 [IU] | Freq: Once | INTRAVENOUS | Status: DC | PRN

## 2023-12-09 NOTE — Progress Notes (Signed)
 [facility]  [address] [place],  [place]  [address] [phone]  Clinic Day:  12/09/2023  Referring physician: Dianah Fort, PA  ASSESSMENT & PLAN:   Assessment & Plan The patient is a 63 y.o. female with extensive stage small cell lung cancer, which includes spread of disease to her liver.  She comes in today to be evaluated before heading into her 7th cycle of maintenance atezolizumab  immunotherapy.  This was preceded by 4 cycles of carboplatin /etoposide /atezolizumab . She denies any fever, chills, nausea or vomiting. She denies issue with bowel or bladder. She states her cough has improved and she denies shortness of breath or chest pain. She will return to clinic in 3 weeks for repeat evaluation with Dr. Harles Lied.    The patient understands the plans discussed today and is in agreement with them.  She knows to contact our office if she develops concerns prior to her next appointment.   I provided 30 minutes of face-to-face time during this encounter and > 50% was spent counseling as documented under my assessment and plan.    Adelaide Adjutant, NP  [facility] DEPT OF [facility] [address] [place] [address] Dept: [phone] Dept Fax: [phone]   No orders of the defined types were placed in this encounter.     CHIEF COMPLAINT:  CC: Small Cell lung cancer left upper lobe.   Current Treatment:  Atezolizumab   HISTORY OF PRESENT ILLNESS:   Oncology History  Small cell lung cancer, left upper lobe (HCC)  07/21/2023 Initial Diagnosis   Small cell lung cancer, left upper lobe (HCC)   08/05/2023 -  Chemotherapy   Patient is on Treatment Plan : LUNG SCLC Carboplatin  + Etoposide  + Atezolizumab  Induction q21d x 4 cycles / Atezolizumab  Maintenance q21d         INTERVAL HISTORY:  Dana Roth is here today for repeat clinical assessment. She denies fevers or chills. She denies pain.  Her appetite is good. Her weight has been stable.  REVIEW OF SYSTEMS:  Review of Systems  Constitutional: Negative.   HENT:  Negative.    Eyes: Negative.   Respiratory: Negative.    Cardiovascular: Negative.   Gastrointestinal: Negative.   Endocrine: Negative.   Genitourinary: Negative.    Musculoskeletal: Negative.   Skin: Negative.   Neurological: Negative.   Hematological: Negative.   Psychiatric/Behavioral: Negative.       VITALS:   Blood pressure 130/82, pulse 94, temperature 99 F (37.2 C), temperature source Oral, resp. rate 20, height 5\' 4"  (1.626 m), weight 171 lb 4.8 oz (77.7 kg).  Wt Readings from Last 3 Encounters:  12/09/23 171 lb 4.8 oz (77.7 kg)  11/27/23 173 lb 15.1 oz (78.9 kg)  11/19/23 174 lb (78.9 kg)    Body mass index is 29.4 kg/m.  Performance status (ECOG): 1 - Symptomatic but completely ambulatory    PHYSICAL EXAM:  Physical Exam Constitutional:      Appearance: Normal appearance. She is normal weight.  HENT:     Head: Normocephalic and atraumatic.     Mouth/Throat:     Mouth: Mucous membranes are moist.  Cardiovascular:     Rate and Rhythm: Normal rate and regular rhythm.     Pulses: Normal pulses.     Heart sounds: Normal heart sounds.  Pulmonary:     Effort: Pulmonary effort is normal.     Breath sounds: Normal breath sounds.  Abdominal:     General: Abdomen is flat.  Musculoskeletal:        General: Normal range of motion.     Cervical back: Normal range of motion.  Skin:    General: Skin is warm and dry.  Neurological:     General: No focal deficit present.     Mental Status: She is alert and oriented to person, place, and time. Mental status is at baseline.  Psychiatric:        Mood and Affect: Mood normal.        Behavior: Behavior normal.        Thought Content: Thought content normal.        Judgment: Judgment normal.     LABS:      Latest Ref Rng & Units 12/09/2023   11:16 AM 11/18/2023   12:40 PM 10/27/2023    10:58 AM  CBC  WBC 4.0 - 10.5 K/uL 7.3  8.2  6.6   Hemoglobin 12.0 - 15.0 g/dL 13.0  86.5  78.4   Hematocrit 36.0 - 46.0 % 46.2  41.1  33.2   Platelets 150 - 400 K/uL 170  166  148       Latest Ref Rng & Units 12/09/2023   11:16 AM 11/18/2023   12:40 PM 10/27/2023   10:58 AM  CMP  Glucose 70 - 99 mg/dL 92  91  82   BUN 8 - 23 mg/dL 12  9  8    Creatinine 0.44 - 1.00 mg/dL 6.96  2.95  2.84   Sodium 135 - 145 mmol/L 139  140  139   Potassium 3.5 - 5.1 mmol/L 4.1  4.0  4.0   Chloride 98 - 111 mmol/L 100  103  104   CO2 22 - 32 mmol/L 26  27  25    Calcium 8.9 - 10.3 mg/dL 13.2  44.0  9.7   Total Protein 6.5 - 8.1 g/dL 7.5  7.3  7.1   Total Bilirubin 0.0 - 1.2 mg/dL 0.3  0.3  <1.0   Alkaline Phos 38 - 126 U/L 123  114  119   AST 15 - 41 U/L 19  24  19    ALT 0 - 44 U/L 18  23  19       No results found for: "CEA1", "CEA" / No results found for: "CEA1", "CEA" No results found for: "PSA1" No results found for: "UVO536" No results found for: "CAN125"  No results found for: "TOTALPROTELP", "ALBUMINELP", "A1GS", "A2GS", "BETS", "BETA2SER", "GAMS", "MSPIKE", "SPEI" Lab Results  Component Value Date   TIBC 301 01/03/2013   FERRITIN 99 01/03/2013   IRONPCTSAT 37 01/03/2013   No results found for: "LDH"  STUDIES:  MM 3D SCREENING MAMMOGRAM BILATERAL BREAST Result Date: 11/27/2023 CLINICAL DATA:  Screening. EXAM: DIGITAL SCREENING BILATERAL MAMMOGRAM WITH TOMOSYNTHESIS AND CAD TECHNIQUE: Bilateral screening digital craniocaudal and mediolateral oblique mammograms were obtained. Bilateral screening digital breast tomosynthesis was performed. The images were evaluated with computer-aided detection. COMPARISON:  Previous 2014 exam. ACR Breast Density Category a: The breasts are almost entirely fatty. FINDINGS: There are no findings suspicious for malignancy. IMPRESSION: No mammographic evidence of malignancy. A result letter of this screening mammogram will be mailed directly to the patient.  RECOMMENDATION: Screening mammogram in one year. (Code:SM-B-01Y) BI-RADS CATEGORY  1: Negative. Electronically Signed   By: Clancy Crimes M.D.   On: 11/27/2023 14:23   IR IMAGING GUIDED PORT INSERTION Result Date: 11/27/2023 CLINICAL DATA:  Fibrin sheath and  thrombus at the tip of the indwelling left-sided subclavian vein chest port. Plan for right common femoral vein access and vein stripping. If vein stripping unsuccessful plan for right chest port placement and left chest port retrieval EXAM: Chest port catheter placement, chest port catheter removal, superior venogram and fibrin sheath stripping TECHNIQUE: Procedure performed using fluoroscopy and ultrasound CONTRAST:  See epic note RADIOPHARMACEUTICALS:  None FLUOROSCOPY: See epic note COMPARISON:  None FINDINGS: The patient was placed in supine position on the IR gantry and the right upper chest and neck were prepped and draped in the usual sterile fashion. The nurse administered intravenous fentanyl  and Versed  (see epic charting for total volume and ) under my supervision and the nurse had no other injuries other than monitoring the patient and administering medications. I was present for the entire duration of procedure. Ultrasound was used to evaluate the right groin and investigate the common femoral vein for patency. The right common femoral vein was compressible and anechoic indicating patency. The needle was advanced under ultrasound guidance from the skin to the midpoint of the vein. Final image of the access procedure was obtained and placed in the patient's permanent medical record. Access was exchanged over a guidewire under fluoroscopic guidance for a 7 French sheath. The 35 cm sheath was advanced over guidewire into the inferior vena cava. An Ensnare device was then advanced through the sheath into the inferior vena cava under fluoroscopic guidance and the snare was deployed and used to grasp the thrombus and fibrous material at the tip of  the catheter in serial fashion. After multiple attempts to actually grasped with the catheter tip were unsuccessful, superior vena cavagram was performed by injecting the chest port with contrast. The thrombus was successfully removed, however there is still a fibrin sheath around the catheter tip. Given its appearance the patient would most likely benefit from removal of the left subclavian chest port and placement of a new and longer chest port catheter. Ultrasound guidance was used to investigate the right internal jugular vein which was anechoic and compressible indicating patency. The needle was then advanced from a scan negative through the soft tissue into the right internal jugular vein under ultrasound guidance. A final image was obtained and stored in the patient's permanent medical record. Access was then exchanged over a guidewire which was advanced under fluoroscopic guidance. The needle was removed and replaced with a micropuncture sheath. Approximately 2 inches below the clavicle the port pocket was created with a subsequent incision. The catheter was then tunneled from the port pocket to the venotomy site overlying the right internal jugular vein. Access was then exchanged over an 035 guidewire for peel-away sheath which was advanced over the guidewire under fluoroscopic guidance. The catheter was then advanced through the peel-away sheath to the sinoatrial junction. Sheath was removed. The catheter was then cut at the port pocket and connected to chest port. The chest port was tested for function and finally function well. The chest port was then flushed with heparin  and a port pocket was closed with 4-0 suture. Final image was obtained demonstrating satisfactory position of chest port. The final count of all materials was satisfactory. The patient's left chest was then prepped and draped in the usual sterile fashion. The region around the port catheter was infiltrated with 1% lidocaine . A small  incision was made through the scar from port placement. Sharp and blunt dissection was then performed in order to access the chest port. Once the port was accessed,  it was clamped and completely removed from the patient. The port pocket was then sutured closed. Dermabond glue was then placed. IMPRESSION: 1. Satisfactory placement of right internal jugular vein single-lumen chest port. The tip of the catheter is at the sinoatrial junction 2.  Okay to use and power inject chest port. 3. Successful removal of the thrombus at the catheter tip from the indwelling catheter, however ultimately unsuccessful fibrin sheath stripping. The left chest port was removed. Electronically Signed   By: Susan Ensign   On: 11/27/2023 13:45   IR REMOVAL TUN ACCESS W/ PORT W/O FL MOD SED Result Date: 11/27/2023 CLINICAL DATA:  Fibrin sheath and thrombus at the tip of the indwelling left-sided subclavian vein chest port. Plan for right common femoral vein access and vein stripping. If vein stripping unsuccessful plan for right chest port placement and left chest port retrieval EXAM: Chest port catheter placement, chest port catheter removal, superior venogram and fibrin sheath stripping TECHNIQUE: Procedure performed using fluoroscopy and ultrasound CONTRAST:  See epic note RADIOPHARMACEUTICALS:  None FLUOROSCOPY: See epic note COMPARISON:  None FINDINGS: The patient was placed in supine position on the IR gantry and the right upper chest and neck were prepped and draped in the usual sterile fashion. The nurse administered intravenous fentanyl  and Versed  (see epic charting for total volume and ) under my supervision and the nurse had no other injuries other than monitoring the patient and administering medications. I was present for the entire duration of procedure. Ultrasound was used to evaluate the right groin and investigate the common femoral vein for patency. The right common femoral vein was compressible and anechoic  indicating patency. The needle was advanced under ultrasound guidance from the skin to the midpoint of the vein. Final image of the access procedure was obtained and placed in the patient's permanent medical record. Access was exchanged over a guidewire under fluoroscopic guidance for a 7 French sheath. The 35 cm sheath was advanced over guidewire into the inferior vena cava. An Ensnare device was then advanced through the sheath into the inferior vena cava under fluoroscopic guidance and the snare was deployed and used to grasp the thrombus and fibrous material at the tip of the catheter in serial fashion. After multiple attempts to actually grasped with the catheter tip were unsuccessful, superior vena cavagram was performed by injecting the chest port with contrast. The thrombus was successfully removed, however there is still a fibrin sheath around the catheter tip. Given its appearance the patient would most likely benefit from removal of the left subclavian chest port and placement of a new and longer chest port catheter. Ultrasound guidance was used to investigate the right internal jugular vein which was anechoic and compressible indicating patency. The needle was then advanced from a scan negative through the soft tissue into the right internal jugular vein under ultrasound guidance. A final image was obtained and stored in the patient's permanent medical record. Access was then exchanged over a guidewire which was advanced under fluoroscopic guidance. The needle was removed and replaced with a micropuncture sheath. Approximately 2 inches below the clavicle the port pocket was created with a subsequent incision. The catheter was then tunneled from the port pocket to the venotomy site overlying the right internal jugular vein. Access was then exchanged over an 035 guidewire for peel-away sheath which was advanced over the guidewire under fluoroscopic guidance. The catheter was then advanced through the  peel-away sheath to the sinoatrial junction. Sheath was removed. The catheter  was then cut at the port pocket and connected to chest port. The chest port was tested for function and finally function well. The chest port was then flushed with heparin  and a port pocket was closed with 4-0 suture. Final image was obtained demonstrating satisfactory position of chest port. The final count of all materials was satisfactory. The patient's left chest was then prepped and draped in the usual sterile fashion. The region around the port catheter was infiltrated with 1% lidocaine . A small incision was made through the scar from port placement. Sharp and blunt dissection was then performed in order to access the chest port. Once the port was accessed, it was clamped and completely removed from the patient. The port pocket was then sutured closed. Dermabond glue was then placed. IMPRESSION: 1. Satisfactory placement of right internal jugular vein single-lumen chest port. The tip of the catheter is at the sinoatrial junction 2.  Okay to use and power inject chest port. 3. Successful removal of the thrombus at the catheter tip from the indwelling catheter, however ultimately unsuccessful fibrin sheath stripping. The left chest port was removed. Electronically Signed   By: Susan Ensign   On: 11/27/2023 13:45   IR Remove CV Fibrin Sheath Result Date: 11/27/2023 Rosetta Cons, MD     11/27/2023 12:07 PM Interventional Radiology Procedure Note Procedure: RIJ SL chest port placement, left sided port removal, vein stripping Complications: None Estimated Blood Loss: < 10 mL Findings: RCFV access for vein stripping.  RIJ access for port placement.  Left subclavian port removed Rosetta Cons, MD   IR CV Line Injection Result Date: 11/24/2023 INDICATION: Left-sided chest port malfunction. No blood return on attempted use 11/19/2023. EXAM: Poor access and contrast injection under fluoroscopy MEDICATIONS: None  ANESTHESIA/SEDATION: None COMPLICATIONS: None immediate. PROCEDURE: The left-sided chest port was evaluated under fluoroscopy and found to be intact. Contrast was then injected after the chest port was accessed with a Huber needle. Contrast can be seen flowing into the reservoir, port catheter, and external to the catheter. Contrast is not easily flow away from the catheter tip is there is a fibrin sheath and filling defect at the tip of the catheter. Contrast then does flow into the SVC and subsequently into the right atrium, right ventricle and pulmonary outflow tract. IMPRESSION: Fibrin sheath obstructing flow and otherwise intact and functioning single-lumen chest port. The patient will be scheduled for catheter stripping. Electronically Signed   By: Susan Ensign   On: 11/24/2023 14:39      HISTORY:   Past Medical History:  Diagnosis Date   Arthritis    Complication of anesthesia    problem with B/P dropping   Depression    Dyslipidemia    GERD (gastroesophageal reflux disease)    HTN (hypertension)    "only with MD visits, caused by anxiety",med taken caused chest pain-so stopped.   Multiple osteochondroma    Multiple bone surgeries   Needle phobia    PONV (postoperative nausea and vomiting)    Rectal bleeding    Small cell lung cancer (HCC)    Suspicious mole     Past Surgical History:  Procedure Laterality Date   bone tumors     Multiple excisions-all over body "occ. bones lock up"   BRONCHIAL BIOPSY  06/30/2023   Procedure: BRONCHIAL BIOPSIES;  Surgeon: Prudy Brownie, DO;  Location: [facility] ENDOSCOPY;  Service: Pulmonary;;   CHOLECYSTECTOMY     COLONOSCOPY WITH PROPOFOL  N/A 06/02/2013   Procedure: COLONOSCOPY  WITH PROPOFOL ;  Surgeon: Yvetta Herbert, MD;  Location: [facility];  Service: Endoscopy;  Laterality: N/A;   COLONOSCOPY WITH PROPOFOL  N/A 05/07/2017   Procedure: COLONOSCOPY WITH PROPOFOL ;  Surgeon: Baldo Bonds, MD;  Location: [facility] ENDOSCOPY;  Service:  Endoscopy;  Laterality: N/A;   HEMOSTASIS CONTROL  06/30/2023   Procedure: HEMOSTASIS CONTROL;  Surgeon: Prudy Brownie, DO;  Location: [facility] ENDOSCOPY;  Service: Pulmonary;;   HOT HEMOSTASIS N/A 05/07/2017   Procedure: HOT HEMOSTASIS (ARGON PLASMA COAGULATION/BICAP);  Surgeon: Baldo Bonds, MD;  Location: [facility] ENDOSCOPY;  Service: Endoscopy;  Laterality: N/A;   IR CV LINE INJECTION  11/24/2023   IR IMAGING GUIDED PORT INSERTION  11/27/2023   IR REMOVAL TUN ACCESS W/ PORT W/O FL MOD SED  11/27/2023   IR REMOVE CV FIBRIN SHEATH  11/27/2023   IR TRANSCATH RETRIEVAL FB INCL GUIDANCE (MS)  09/02/2023   IR US  GUIDE VASC ACCESS RIGHT  09/02/2023   IR VENO/JUGULAR RIGHT  09/02/2023   PORTA CATH INSERTION     VIDEO BRONCHOSCOPY N/A 06/30/2023   Procedure: VIDEO BRONCHOSCOPY;  Surgeon: Prudy Brownie, DO;  Location: [facility] ENDOSCOPY;  Service: Pulmonary;  Laterality: N/A;    Family History  Problem Relation Age of Onset   Heart disease Brother        "Hole in heart"   CAD Sister     Social History:  reports that she has been smoking cigarettes. She has a 40 pack-year smoking history. She has never used smokeless tobacco. She reports current drug use. Frequency: 7.00 times per week. Drug: Marijuana. She reports that she does not drink alcohol.The patient is alone today.  Allergies:  Allergies  Allergen Reactions   Codeine     Current Medications: Current Outpatient Medications  Medication Sig Dispense Refill   albuterol  (VENTOLIN  HFA) 108 (90 Base) MCG/ACT inhaler Inhale 2 puffs into the lungs every 6 (six) hours as needed for wheezing or shortness of breath. 6.7 g 2   budesonide -formoterol  (SYMBICORT ) 80-4.5 MCG/ACT inhaler Inhale 2 puffs into the lungs 2 (two) times daily. 30.6 g 12   busPIRone (BUSPAR) 10 MG tablet      cetirizine (ZYRTEC) 10 MG tablet Take 10 mg by mouth daily.     clonazePAM (KLONOPIN) 0.5 MG tablet Take 0.5 mg by mouth 3 (three) times daily as needed.     cyclobenzaprine  (FLEXERIL) 5 MG tablet      Fluticasone-Umeclidin-Vilant (TRELEGY ELLIPTA ) 100-62.5-25 MCG/ACT AEPB Inhale 1 puff into the lungs daily.     gabapentin (NEURONTIN) 400 MG capsule      HYDROcodone -acetaminophen  (NORCO) 7.5-325 MG tablet Take 1 tablet by mouth every 6 (six) hours as needed for moderate pain (pain score 4-6). (Patient not taking: Reported on 12/09/2023) 30 tablet 0   ipratropium-albuterol  (DUONEB) 0.5-2.5 (3) MG/3ML SOLN Take 3 mLs by nebulization every 6 (six) hours as needed. 360 mL 3   loratadine (CLARITIN) 10 MG tablet Take 10 mg by mouth daily. For 5 days, starting day she receives WBC injection. Repeat each cycle.     meloxicam (MOBIC) 15 MG tablet      nicotine  (NICODERM CQ  - DOSED IN MG/24 HOURS) 14 mg/24hr patch Place 14 mg onto the skin daily.     ondansetron  (ZOFRAN -ODT) 4 MG disintegrating tablet Take 1 tablet (4 mg total) by mouth every 6 (six) hours as needed for nausea or vomiting. 30 tablet 3   polyethylene glycol powder (MIRALAX) 17 GM/SCOOP powder      prochlorperazine  (COMPAZINE )  10 MG tablet Take 1 tablet (10 mg total) by mouth every 6 (six) hours as needed for nausea or vomiting. 30 tablet 3   sennosides-docusate sodium (SENOKOT-S) 8.6-50 MG tablet Take 2 tablets by mouth in the morning and at bedtime.     tiZANidine (ZANAFLEX) 4 MG capsule Take 4 mg by mouth 3 (three) times daily as needed for muscle spasms.     No current facility-administered medications for this visit.   Facility-Administered Medications Ordered in Other Visits  Medication Dose Route Frequency Provider Last Rate Last Admin   0.9 %  sodium chloride  infusion   Intravenous Continuous Lewis, Dequincy A, MD       atezolizumab  (TECENTRIQ ) 1,200 mg in sodium chloride  0.9 % 250 mL chemo infusion  1,200 mg Intravenous Once Lewis, Dequincy A, MD       heparin  lock flush 100 unit/mL  500 Units Intracatheter Once PRN Lewis, Dequincy A, MD       sodium chloride  flush (NS) 0.9 % injection 10 mL  10 mL  Intracatheter PRN Deloria Fetch, MD

## 2023-12-09 NOTE — Telephone Encounter (Signed)
 Patient has been scheduled for follow-up visit per 12/09/23 LOS.  Pt given an appt calendar with date and time.

## 2023-12-09 NOTE — Patient Instructions (Signed)
 Atezolizumab Injection What is this medication? ATEZOLIZUMAB (a te zoe LIZ ue mab) treats some types of cancer. It works by helping your immune system slow or stop the spread of cancer cells. It is a monoclonal antibody. This medicine may be used for other purposes; ask your health care provider or pharmacist if you have questions. COMMON BRAND NAME(S): Tecentriq What should I tell my care team before I take this medication? They need to know if you have any of these conditions: Allogeneic stem cell transplant (uses someone else's stem cells) Autoimmune diseases, such as Crohn disease, ulcerative colitis, lupus History of chest radiation Nervous system problems, such as Guillain-Barre syndrome, myasthenia gravis Organ transplant An unusual or allergic reaction to atezolizumab, other medications, foods, dyes, or preservatives Pregnant or trying to get pregnant Breast-feeding How should I use this medication? This medication is injected into a vein. It is given by your care team in a hospital or clinic setting. A special MedGuide will be given to you before each treatment. Be sure to read this information carefully each time. Talk to your care team about the use of this medication in children. While it may be prescribed for children as young as 2 years for selected conditions, precautions do apply. Overdosage: If you think you have taken too much of this medicine contact a poison control center or emergency room at once. NOTE: This medicine is only for you. Do not share this medicine with others. What if I miss a dose? Keep appointments for follow-up doses. It is important not to miss your dose. Call your care team if you are unable to keep an appointment. What may interact with this medication? Interactions have not been studied. This list may not describe all possible interactions. Give your health care provider a list of all the medicines, herbs, non-prescription drugs, or dietary  supplements you use. Also tell them if you smoke, drink alcohol, or use illegal drugs. Some items may interact with your medicine. What should I watch for while using this medication? Your condition will be monitored carefully while you are receiving this medication. You may need blood work done while you are taking this medication. This medication may cause serious skin reactions. They can happen weeks to months after starting the medication. Contact your care team right away if you notice fevers or flu-like symptoms with a rash. The rash may be red or purple and then turn into blisters or peeling of the skin. You may also notice a red rash with swelling of the face, lips, or lymph nodes in your neck or under your arms. Talk to your care team if you may be pregnant. Serious birth defects can occur if you take this medication during pregnancy and for 5 months after the last dose. You will need a negative pregnancy test before starting this medication. Contraception is recommended while taking this medication and for 5 months after the last dose. Your care team can help you find the option that works for you. Do not breastfeed while taking this medication and for 5 months after the last dose. This medication may cause infertility. Talk to your care team if you are concerned about your fertility. What side effects may I notice from receiving this medication? Side effects that you should report to your doctor or health care professional as soon as possible: Allergic reactions--skin rash, itching, hives, swelling of the face, lips, tongue, or throat Dry cough, shortness of breath or trouble breathing Eye pain, redness, irritation, or discharge  with blurry or decreased vision Heart muscle inflammation--unusual weakness or fatigue, shortness of breath, chest pain, fast or irregular heartbeat, dizziness, swelling of the ankles, feet, or hands Hormone gland problems--headache, sensitivity to light, unusual  weakness or fatigue, dizziness, fast or irregular heartbeat, increased sensitivity to cold or heat, excessive sweating, constipation, hair loss, increased thirst or amount of urine, tremors or shaking, irritability Infusion reactions--chest pain, shortness of breath or trouble breathing, feeling faint or lightheaded Kidney injury (glomerulonephritis)--decrease in the amount of urine, red or dark brown urine, foamy or bubbly urine, swelling of the ankles, hands, or feet Liver injury--right upper belly pain, loss of appetite, nausea, light-colored stool, dark yellow or brown urine, yellowing skin or eyes, unusual weakness or fatigue Pain, tingling, or numbness in the hands or feet, muscle weakness, change in vision, confusion or trouble speaking, loss of balance or coordination, trouble walking, seizures Rash, fever, and swollen lymph nodes Redness, blistering, peeling, or loosening of the skin, including inside the mouth Sudden or severe stomach pain, bloody diarrhea, fever, nausea, vomiting Side effects that usually do not require medical attention (report to your doctor or health care professional if they continue or are bothersome): Bone, joint, or muscle pain Diarrhea Fatigue Loss of appetite Nausea Skin rash This list may not describe all possible side effects. Call your doctor for medical advice about side effects. You may report side effects to FDA at 1-800-FDA-1088. Where should I keep my medication? This medication is given in a hospital or clinic. It will not be stored at home. NOTE: This sheet is a summary. It may not cover all possible information. If you have questions about this medicine, talk to your doctor, pharmacist, or health care provider.  2024 Elsevier/Gold Standard (2023-04-22 00:00:00)

## 2023-12-10 ENCOUNTER — Other Ambulatory Visit: Payer: Self-pay

## 2023-12-16 ENCOUNTER — Inpatient Hospital Stay

## 2023-12-16 NOTE — Progress Notes (Signed)
 [facility] CSW Progress Note  Clinical Child psychotherapist contacted patient by phone to discuss documents dropped off for CSW. CSW assisted patient with understanding medical letters / notices.    Maudie Sorrow, LCSW Clinical Social Worker [facility]

## 2023-12-21 ENCOUNTER — Encounter (HOSPITAL_COMMUNITY): Payer: Self-pay | Admitting: Interventional Radiology

## 2023-12-21 NOTE — Addendum Note (Signed)
 Encounter addended by: Adam Holm on: 12/21/2023 3:01 PM  Actions taken: Imaging Exam ended, Charge Capture section accepted

## 2023-12-24 ENCOUNTER — Inpatient Hospital Stay

## 2023-12-24 ENCOUNTER — Other Ambulatory Visit: Payer: Self-pay

## 2023-12-29 NOTE — Progress Notes (Unsigned)
 [facility]  [address] [place],  [place]  [address] [phone]  Clinic Day:  12/30/2023  Referring physician: Dianah Fort, PA   HISTORY OF PRESENT ILLNESS:  The patient is a 63 y.o. female with extensive stage small cell lung cancer, which includes spread of disease to her liver.  She comes in today to be evaluated before heading into her 4th cycle of maintenance atezolizumab  immunotherapy.  This was preceded by 4 cycles of carboplatin /etoposide /atezolizumab .  The patient claims to have tolerated her 3rd cycle of atezolizumab  very well.  She still complains of occasional shortness of breath.  However, she continues to smoke at least a half a pack of cigarettes daily.  PHYSICAL EXAM:  Blood pressure (!) 140/88, pulse (!) 102, temperature 98.1 F (36.7 C), temperature source Oral, resp. rate 16, height 5' 4 (1.626 m), weight 174 lb (78.9 kg), SpO2 96%. Wt Readings from Last 3 Encounters:  12/30/23 174 lb (78.9 kg)  12/09/23 171 lb 4.8 oz (77.7 kg)  11/27/23 173 lb 15.1 oz (78.9 kg)   Body mass index is 29.87 kg/m. Performance status (ECOG): 1 - Symptomatic but completely ambulatory Physical Exam Constitutional:      Appearance: Normal appearance. She is not ill-appearing.     Comments: An anxious appearing woman who is walking on her own power.   HENT:     Mouth/Throat:     Mouth: Mucous membranes are moist.     Pharynx: Oropharynx is clear. No oropharyngeal exudate or posterior oropharyngeal erythema.  Cardiovascular:     Rate and Rhythm: Normal rate and regular rhythm.     Heart sounds: No murmur heard.    No friction rub. No gallop.  Pulmonary:     Effort: Pulmonary effort is normal. No respiratory distress.     Breath sounds: Decreased breath sounds present. No wheezing, rhonchi or rales.  Abdominal:     General: Bowel sounds are normal. There is no distension.     Palpations: Abdomen is soft. There is no mass.      Tenderness: There is no abdominal tenderness.  Musculoskeletal:        General: No swelling.     Right lower leg: No edema.     Left lower leg: No edema.  Lymphadenopathy:     Cervical: No cervical adenopathy.     Upper Body:     Right upper body: No supraclavicular or axillary adenopathy.     Left upper body: No supraclavicular or axillary adenopathy.     Lower Body: No right inguinal adenopathy. No left inguinal adenopathy.  Skin:    General: Skin is warm.     Coloration: Skin is not jaundiced.     Findings: No lesion or rash.  Neurological:     General: No focal deficit present.     Mental Status: She is alert and oriented to person, place, and time. Mental status is at baseline.  Psychiatric:        Mood and Affect: Mood normal.        Behavior: Behavior normal.        Thought Content: Thought content normal.    LABS:      Latest Ref Rng & Units 12/30/2023    9:39 AM 12/09/2023   11:16 AM 11/18/2023   12:40 PM  CBC  WBC 4.0 - 10.5 K/uL 6.3  7.3  8.2   Hemoglobin 12.0 - 15.0 g/dL 13.0  86.5  78.4   Hematocrit  36.0 - 46.0 % 44.9  46.2  41.1   Platelets 150 - 400 K/uL 154  170  166       Latest Ref Rng & Units 12/30/2023    9:39 AM 12/09/2023   11:16 AM 11/18/2023   12:40 PM  CMP  Glucose 70 - 99 mg/dL 295  92  91   BUN 8 - 23 mg/dL 13  12  9    Creatinine 0.44 - 1.00 mg/dL 6.21  3.08  6.57   Sodium 135 - 145 mmol/L 139  139  140   Potassium 3.5 - 5.1 mmol/L 3.9  4.1  4.0   Chloride 98 - 111 mmol/L 103  100  103   CO2 22 - 32 mmol/L 24  26  27    Calcium 8.9 - 10.3 mg/dL 9.9  84.6  96.2   Total Protein 6.5 - 8.1 g/dL 7.1  7.5  7.3   Total Bilirubin 0.0 - 1.2 mg/dL 0.2  0.3  0.3   Alkaline Phos 38 - 126 U/L 110  123  114   AST 15 - 41 U/L 17  19  24    ALT 0 - 44 U/L 19  18  23     ASSESSMENT & PLAN:  A 63 y.o. female with extensive stage small cell lung cancer, including spread of disease to her liver and bones.  She will proceed with her fourth cycle of atezolizumab   today, which is being given once every 3 weeks for her maintenance immunotherapy.  Overall, I am very pleased with how well the patient looks today.  I once again talked to her about the importance of complete smoking abstinence.  She understands her breathing issues will get worse if she continues to smoke.  As she is emotionally distraught due to the lack of support that she has as she goes to her lung cancer treatment, we will place a social work consult.  Otherwise, I will see her back in 3 weeks before she heads into her 5th cycle of maintenance atezolizumab  immunotherapy.  The patient understands all the plans discussed today and is in agreement with them.  Panagiotis Oelkers Felicia Horde, MD

## 2023-12-30 ENCOUNTER — Encounter: Payer: Self-pay | Admitting: Oncology

## 2023-12-30 ENCOUNTER — Inpatient Hospital Stay

## 2023-12-30 ENCOUNTER — Inpatient Hospital Stay: Attending: Oncology | Admitting: Oncology

## 2023-12-30 ENCOUNTER — Other Ambulatory Visit: Payer: Self-pay | Admitting: Pharmacist

## 2023-12-30 VITALS — BP 140/88 | HR 102 | Temp 98.1°F | Resp 16 | Ht 64.0 in | Wt 174.0 lb

## 2023-12-30 VITALS — HR 88

## 2023-12-30 DIAGNOSIS — Z5112 Encounter for antineoplastic immunotherapy: Secondary | ICD-10-CM | POA: Diagnosis present

## 2023-12-30 DIAGNOSIS — C3412 Malignant neoplasm of upper lobe, left bronchus or lung: Secondary | ICD-10-CM | POA: Diagnosis not present

## 2023-12-30 DIAGNOSIS — F17210 Nicotine dependence, cigarettes, uncomplicated: Secondary | ICD-10-CM | POA: Diagnosis not present

## 2023-12-30 DIAGNOSIS — C787 Secondary malignant neoplasm of liver and intrahepatic bile duct: Secondary | ICD-10-CM | POA: Insufficient documentation

## 2023-12-30 DIAGNOSIS — C3492 Malignant neoplasm of unspecified part of left bronchus or lung: Secondary | ICD-10-CM | POA: Diagnosis present

## 2023-12-30 LAB — CMP (CANCER CENTER ONLY)
ALT: 19 U/L (ref 0–44)
AST: 17 U/L (ref 15–41)
Albumin: 4.5 g/dL (ref 3.5–5.0)
Alkaline Phosphatase: 110 U/L (ref 38–126)
Anion gap: 12 (ref 5–15)
BUN: 13 mg/dL (ref 8–23)
CO2: 24 mmol/L (ref 22–32)
Calcium: 9.9 mg/dL (ref 8.9–10.3)
Chloride: 103 mmol/L (ref 98–111)
Creatinine: 0.8 mg/dL (ref 0.44–1.00)
GFR, Estimated: >60 mL/min (ref >60)
Glucose, Bld: 117 mg/dL — ABNORMAL HIGH (ref 70–99)
Potassium: 3.9 mmol/L (ref 3.5–5.1)
Sodium: 139 mmol/L (ref 135–145)
Total Bilirubin: 0.2 mg/dL (ref 0.0–1.2)
Total Protein: 7.1 g/dL (ref 6.5–8.1)

## 2023-12-30 LAB — CBC WITH DIFFERENTIAL (CANCER CENTER ONLY)
Abs Immature Granulocytes: 0.02 10*3/uL (ref 0.00–0.07)
Basophils Absolute: 0 10*3/uL (ref 0.0–0.1)
Basophils Relative: 0 %
Eosinophils Absolute: 0.1 10*3/uL (ref 0.0–0.5)
Eosinophils Relative: 2 %
HCT: 44.9 % (ref 36.0–46.0)
Hemoglobin: 14.3 g/dL (ref 12.0–15.0)
Immature Granulocytes: 0 %
Lymphocytes Relative: 20 %
Lymphs Abs: 1.3 10*3/uL (ref 0.7–4.0)
MCH: 29.9 pg (ref 26.0–34.0)
MCHC: 31.8 g/dL (ref 30.0–36.0)
MCV: 93.7 fL (ref 80.0–100.0)
Monocytes Absolute: 0.4 10*3/uL (ref 0.1–1.0)
Monocytes Relative: 6 %
Neutro Abs: 4.5 10*3/uL (ref 1.7–7.7)
Neutrophils Relative %: 72 %
Platelet Count: 154 10*3/uL (ref 150–400)
RBC: 4.79 MIL/uL (ref 3.87–5.11)
RDW: 12.3 % (ref 11.5–15.5)
WBC Count: 6.3 10*3/uL (ref 4.0–10.5)
nRBC: 0 % (ref 0.0–0.2)

## 2023-12-30 MED ORDER — SODIUM CHLORIDE 0.9 % IV SOLN: INTRAVENOUS | Status: DC

## 2023-12-30 MED ORDER — SODIUM CHLORIDE 0.9 % IV SOLN
1200.0000 mg | Freq: Once | INTRAVENOUS | Status: AC
  Administered 2023-12-30: 1200 mg via INTRAVENOUS
  Filled 2023-12-30: qty 20

## 2023-12-30 MED ORDER — SODIUM CHLORIDE 0.9% FLUSH: 10.0000 mL | INTRAVENOUS | Status: DC | PRN

## 2023-12-30 MED ORDER — HEPARIN SOD (PORK) LOCK FLUSH 100 UNIT/ML IV SOLN
500.0000 [IU] | Freq: Once | INTRAVENOUS | Status: DC | PRN
Start: 2023-12-30 — End: 2023-12-30

## 2023-12-30 NOTE — Addendum Note (Signed)
 Encounter addended by: Adam Holm on: 12/30/2023 1:41 PM  Actions taken: SmartForm saved

## 2023-12-30 NOTE — Addendum Note (Signed)
 Encounter addended by: Adam Holm on: 12/30/2023 11:08 AM  Actions taken: Imaging Exam ended

## 2023-12-30 NOTE — Patient Instructions (Signed)
 Atezolizumab Injection What is this medication? ATEZOLIZUMAB (a te zoe LIZ ue mab) treats some types of cancer. It works by helping your immune system slow or stop the spread of cancer cells. It is a monoclonal antibody. This medicine may be used for other purposes; ask your health care provider or pharmacist if you have questions. COMMON BRAND NAME(S): Tecentriq What should I tell my care team before I take this medication? They need to know if you have any of these conditions: Allogeneic stem cell transplant (uses someone else's stem cells) Autoimmune diseases, such as Crohn disease, ulcerative colitis, lupus History of chest radiation Nervous system problems, such as Guillain-Barre syndrome, myasthenia gravis Organ transplant An unusual or allergic reaction to atezolizumab, other medications, foods, dyes, or preservatives Pregnant or trying to get pregnant Breast-feeding How should I use this medication? This medication is injected into a vein. It is given by your care team in a hospital or clinic setting. A special MedGuide will be given to you before each treatment. Be sure to read this information carefully each time. Talk to your care team about the use of this medication in children. While it may be prescribed for children as young as 2 years for selected conditions, precautions do apply. Overdosage: If you think you have taken too much of this medicine contact a poison control center or emergency room at once. NOTE: This medicine is only for you. Do not share this medicine with others. What if I miss a dose? Keep appointments for follow-up doses. It is important not to miss your dose. Call your care team if you are unable to keep an appointment. What may interact with this medication? Interactions have not been studied. This list may not describe all possible interactions. Give your health care provider a list of all the medicines, herbs, non-prescription drugs, or dietary  supplements you use. Also tell them if you smoke, drink alcohol, or use illegal drugs. Some items may interact with your medicine. What should I watch for while using this medication? Your condition will be monitored carefully while you are receiving this medication. You may need blood work done while you are taking this medication. This medication may cause serious skin reactions. They can happen weeks to months after starting the medication. Contact your care team right away if you notice fevers or flu-like symptoms with a rash. The rash may be red or purple and then turn into blisters or peeling of the skin. You may also notice a red rash with swelling of the face, lips, or lymph nodes in your neck or under your arms. Talk to your care team if you may be pregnant. Serious birth defects can occur if you take this medication during pregnancy and for 5 months after the last dose. You will need a negative pregnancy test before starting this medication. Contraception is recommended while taking this medication and for 5 months after the last dose. Your care team can help you find the option that works for you. Do not breastfeed while taking this medication and for 5 months after the last dose. This medication may cause infertility. Talk to your care team if you are concerned about your fertility. What side effects may I notice from receiving this medication? Side effects that you should report to your doctor or health care professional as soon as possible: Allergic reactions--skin rash, itching, hives, swelling of the face, lips, tongue, or throat Dry cough, shortness of breath or trouble breathing Eye pain, redness, irritation, or discharge  with blurry or decreased vision Heart muscle inflammation--unusual weakness or fatigue, shortness of breath, chest pain, fast or irregular heartbeat, dizziness, swelling of the ankles, feet, or hands Hormone gland problems--headache, sensitivity to light, unusual  weakness or fatigue, dizziness, fast or irregular heartbeat, increased sensitivity to cold or heat, excessive sweating, constipation, hair loss, increased thirst or amount of urine, tremors or shaking, irritability Infusion reactions--chest pain, shortness of breath or trouble breathing, feeling faint or lightheaded Kidney injury (glomerulonephritis)--decrease in the amount of urine, red or dark brown urine, foamy or bubbly urine, swelling of the ankles, hands, or feet Liver injury--right upper belly pain, loss of appetite, nausea, light-colored stool, dark yellow or brown urine, yellowing skin or eyes, unusual weakness or fatigue Pain, tingling, or numbness in the hands or feet, muscle weakness, change in vision, confusion or trouble speaking, loss of balance or coordination, trouble walking, seizures Rash, fever, and swollen lymph nodes Redness, blistering, peeling, or loosening of the skin, including inside the mouth Sudden or severe stomach pain, bloody diarrhea, fever, nausea, vomiting Side effects that usually do not require medical attention (report to your doctor or health care professional if they continue or are bothersome): Bone, joint, or muscle pain Diarrhea Fatigue Loss of appetite Nausea Skin rash This list may not describe all possible side effects. Call your doctor for medical advice about side effects. You may report side effects to FDA at 1-800-FDA-1088. Where should I keep my medication? This medication is given in a hospital or clinic. It will not be stored at home. NOTE: This sheet is a summary. It may not cover all possible information. If you have questions about this medicine, talk to your doctor, pharmacist, or health care provider.  2024 Elsevier/Gold Standard (2023-04-22 00:00:00)

## 2023-12-31 ENCOUNTER — Inpatient Hospital Stay

## 2023-12-31 NOTE — Progress Notes (Signed)
 [facility] CSW Progress Note  Clinical Child psychotherapist contacted patient by phone to assess psychosocial needs.  Received referral from Dr. Harles Lied stating that patient was tearful during her visit recently, but did not give a reason.  Patient discussed losing her support and being responsible for getting her needs met.  She talked about the importance of her dog in her life.  Confirmed her visit with her primary CSW, Maudie Sorrow next Thursday, 6/19, at 8am.  CSW provided active listening and supportive counseling.   Kennth Peal, LCSW Clinical Social Worker [facility]    Patient is participating in a Managed Medicaid Plan:  Yes

## 2024-01-01 ENCOUNTER — Other Ambulatory Visit: Payer: Self-pay

## 2024-01-07 ENCOUNTER — Inpatient Hospital Stay

## 2024-01-07 NOTE — Addendum Note (Signed)
 Encounter addended by: Karolynn Pack on: 01/07/2024 12:30 PM  Actions taken: Imaging Exam ended

## 2024-01-07 NOTE — Progress Notes (Signed)
 [facility] CSW Progress Note  Visual merchandiser met with patient in person on this date. Patiens primary concern is navigating medical bills and continued challenges with anxiety / depression.  CSW reviewed medical bills presented and identified billing departments number for patient to call. CSW attempted to reach [employer] to confirm requested information was sent. CSW sent a referral in for [facility] on patient's behalf. CSW began [facility] Application, pending submission, once documents have been returned to CSW.  Maudie Sorrow, LCSW Clinical Social Worker [facility]

## 2024-01-10 ENCOUNTER — Other Ambulatory Visit: Payer: Self-pay

## 2024-01-11 ENCOUNTER — Telehealth: Payer: Self-pay

## 2024-01-11 NOTE — Telephone Encounter (Signed)
 In error

## 2024-01-19 NOTE — Progress Notes (Deleted)
 [facility]  [address] [place],  [place]  [address] [phone]  Clinic Day:  12/30/2023  Referring physician: Rosalea [name], PA   HISTORY OF PRESENT ILLNESS:  The patient is a 63 y.o. female with extensive stage small cell lung cancer, which includes spread of disease to her liver.  She comes in today to be evaluated before heading into her 5th cycle of maintenance atezolizumab  immunotherapy.  This was preceded by 4 cycles of carboplatin /etoposide /atezolizumab .  The patient claims to have tolerated her 4th cycle of atezolizumab  very well.  She still complains of occasional shortness of breath.  However, she continues to smoke at least a half a pack of cigarettes daily.  PHYSICAL EXAM:  There were no vitals taken for this visit. Wt Readings from Last 3 Encounters:  12/30/23 174 lb (78.9 kg)  12/09/23 171 lb 4.8 oz (77.7 kg)  11/27/23 173 lb 15.1 oz (78.9 kg)   There is no height or weight on file to calculate BMI. Performance status (ECOG): 1 - Symptomatic but completely ambulatory Physical Exam Constitutional:      Appearance: Normal appearance. She is not ill-appearing.     Comments: An anxious appearing woman who is walking on her own power.   HENT:     Mouth/Throat:     Mouth: Mucous membranes are moist.     Pharynx: Oropharynx is clear. No oropharyngeal exudate or posterior oropharyngeal erythema.   Cardiovascular:     Rate and Rhythm: Normal rate and regular rhythm.     Heart sounds: No murmur heard.    No friction rub. No gallop.  Pulmonary:     Effort: Pulmonary effort is normal. No respiratory distress.     Breath sounds: Decreased breath sounds present. No wheezing, rhonchi or rales.  Abdominal:     General: Bowel sounds are normal. There is no distension.     Palpations: Abdomen is soft. There is no mass.     Tenderness: There is no abdominal tenderness.   Musculoskeletal:        General: No swelling.     Right lower  leg: No edema.     Left lower leg: No edema.  Lymphadenopathy:     Cervical: No cervical adenopathy.     Upper Body:     Right upper body: No supraclavicular or axillary adenopathy.     Left upper body: No supraclavicular or axillary adenopathy.     Lower Body: No right inguinal adenopathy. No left inguinal adenopathy.   Skin:    General: Skin is warm.     Coloration: Skin is not jaundiced.     Findings: No lesion or rash.   Neurological:     General: No focal deficit present.     Mental Status: She is alert and oriented to person, place, and time. Mental status is at baseline.   Psychiatric:        Mood and Affect: Mood normal.        Behavior: Behavior normal.        Thought Content: Thought content normal.   LABS:      Latest Ref Rng & Units 12/30/2023    9:39 AM 12/09/2023   11:16 AM 11/18/2023   12:40 PM  CBC  WBC 4.0 - 10.5 K/uL 6.3  7.3  8.2   Hemoglobin 12.0 - 15.0 g/dL 85.6  85.0  86.6   Hematocrit 36.0 - 46.0 % 44.9  46.2  41.1   Platelets 150 -  400 K/uL 154  170  166       Latest Ref Rng & Units 12/30/2023    9:39 AM 12/09/2023   11:16 AM 11/18/2023   12:40 PM  CMP  Glucose 70 - 99 mg/dL 882  92  91   BUN 8 - 23 mg/dL 13  12  9    Creatinine 0.44 - 1.00 mg/dL 9.19  9.24  9.21   Sodium 135 - 145 mmol/L 139  139  140   Potassium 3.5 - 5.1 mmol/L 3.9  4.1  4.0   Chloride 98 - 111 mmol/L 103  100  103   CO2 22 - 32 mmol/L 24  26  27    Calcium 8.9 - 10.3 mg/dL 9.9  89.7  89.5   Total Protein 6.5 - 8.1 g/dL 7.1  7.5  7.3   Total Bilirubin 0.0 - 1.2 mg/dL 0.2  0.3  0.3   Alkaline Phos 38 - 126 U/L 110  123  114   AST 15 - 41 U/L 17  19  24    ALT 0 - 44 U/L 19  18  23     ASSESSMENT & PLAN:  A 63 y.o. female with extensive stage small cell lung cancer, including spread of disease to her liver and bones.  She will proceed with her fourth cycle of atezolizumab  today, which is being given once every 3 weeks for her maintenance immunotherapy.  Overall, I am very  pleased with how well the patient looks today.  I once again talked to her about the importance of complete smoking abstinence.  She understands her breathing issues will get worse if she continues to smoke.  As she is emotionally distraught due to the lack of support that she has as she goes to her lung cancer treatment, we will place a social work consult.  Otherwise, I will see her back in 3 weeks before she heads into her 5th cycle of maintenance atezolizumab  immunotherapy.  The patient understands all the plans discussed today and is in agreement with them.  Gjon Letarte DELENA Kerns, MD

## 2024-01-20 ENCOUNTER — Inpatient Hospital Stay: Attending: Oncology

## 2024-01-20 ENCOUNTER — Observation Stay (HOSPITAL_COMMUNITY)
Admission: EM | Admit: 2024-01-20 | Discharge: 2024-01-21 | Disposition: A | Discharge status: Home / Self Care | Source: Other Acute Inpatient Hospital | Attending: Hospitalist | Admitting: Hospitalist

## 2024-01-20 ENCOUNTER — Other Ambulatory Visit: Payer: Self-pay

## 2024-01-20 ENCOUNTER — Inpatient Hospital Stay: Admitting: Oncology

## 2024-01-20 ENCOUNTER — Encounter (HOSPITAL_COMMUNITY): Payer: Self-pay

## 2024-01-20 ENCOUNTER — Encounter: Payer: Self-pay | Admitting: Oncology

## 2024-01-20 ENCOUNTER — Emergency Department (HOSPITAL_COMMUNITY)

## 2024-01-20 ENCOUNTER — Inpatient Hospital Stay

## 2024-01-20 DIAGNOSIS — R471 Dysarthria and anarthria: Secondary | ICD-10-CM

## 2024-01-20 DIAGNOSIS — F419 Anxiety disorder, unspecified: Secondary | ICD-10-CM | POA: Diagnosis not present

## 2024-01-20 DIAGNOSIS — I10 Essential (primary) hypertension: Secondary | ICD-10-CM | POA: Insufficient documentation

## 2024-01-20 DIAGNOSIS — F12920 Cannabis use, unspecified with intoxication, uncomplicated: Secondary | ICD-10-CM | POA: Insufficient documentation

## 2024-01-20 DIAGNOSIS — J449 Chronic obstructive pulmonary disease, unspecified: Secondary | ICD-10-CM | POA: Insufficient documentation

## 2024-01-20 DIAGNOSIS — G936 Cerebral edema: Secondary | ICD-10-CM | POA: Diagnosis not present

## 2024-01-20 DIAGNOSIS — C3492 Malignant neoplasm of unspecified part of left bronchus or lung: Secondary | ICD-10-CM | POA: Insufficient documentation

## 2024-01-20 DIAGNOSIS — C3490 Malignant neoplasm of unspecified part of unspecified bronchus or lung: Secondary | ICD-10-CM | POA: Diagnosis not present

## 2024-01-20 DIAGNOSIS — F32A Depression, unspecified: Secondary | ICD-10-CM | POA: Diagnosis not present

## 2024-01-20 DIAGNOSIS — Z6829 Body mass index (BMI) 29.0-29.9, adult: Secondary | ICD-10-CM | POA: Insufficient documentation

## 2024-01-20 DIAGNOSIS — C3412 Malignant neoplasm of upper lobe, left bronchus or lung: Secondary | ICD-10-CM

## 2024-01-20 DIAGNOSIS — F17210 Nicotine dependence, cigarettes, uncomplicated: Secondary | ICD-10-CM | POA: Diagnosis not present

## 2024-01-20 DIAGNOSIS — R4701 Aphasia: Secondary | ICD-10-CM | POA: Diagnosis present

## 2024-01-20 DIAGNOSIS — C787 Secondary malignant neoplasm of liver and intrahepatic bile duct: Secondary | ICD-10-CM | POA: Insufficient documentation

## 2024-01-20 DIAGNOSIS — C7951 Secondary malignant neoplasm of bone: Secondary | ICD-10-CM | POA: Insufficient documentation

## 2024-01-20 DIAGNOSIS — K219 Gastro-esophageal reflux disease without esophagitis: Secondary | ICD-10-CM | POA: Diagnosis not present

## 2024-01-20 DIAGNOSIS — R4781 Slurred speech: Secondary | ICD-10-CM

## 2024-01-20 DIAGNOSIS — C7931 Secondary malignant neoplasm of brain: Secondary | ICD-10-CM | POA: Insufficient documentation

## 2024-01-20 LAB — CBC WITH DIFFERENTIAL/PLATELET
Abs Immature Granulocytes: 0.02 10*3/uL (ref 0.00–0.07)
Basophils Absolute: 0 10*3/uL (ref 0.0–0.1)
Basophils Relative: 1 %
Eosinophils Absolute: 0.2 10*3/uL (ref 0.0–0.5)
Eosinophils Relative: 4 %
HCT: 45.8 % (ref 36.0–46.0)
Hemoglobin: 15 g/dL (ref 12.0–15.0)
Immature Granulocytes: 0 %
Lymphocytes Relative: 22 %
Lymphs Abs: 1.4 10*3/uL (ref 0.7–4.0)
MCH: 29.8 pg (ref 26.0–34.0)
MCHC: 32.8 g/dL (ref 30.0–36.0)
MCV: 91.1 fL (ref 80.0–100.0)
Monocytes Absolute: 0.4 10*3/uL (ref 0.1–1.0)
Monocytes Relative: 6 %
Neutro Abs: 4.3 10*3/uL (ref 1.7–7.7)
Neutrophils Relative %: 67 %
Platelets: 190 10*3/uL (ref 150–400)
RBC: 5.03 MIL/uL (ref 3.87–5.11)
RDW: 12.3 % (ref 11.5–15.5)
WBC: 6.4 10*3/uL (ref 4.0–10.5)
nRBC: 0 % (ref 0.0–0.2)

## 2024-01-20 LAB — CBC WITH DIFFERENTIAL (CANCER CENTER ONLY)
Abs Immature Granulocytes: 0.02 10*3/uL (ref 0.00–0.07)
Basophils Absolute: 0 10*3/uL (ref 0.0–0.1)
Basophils Relative: 0 %
Eosinophils Absolute: 0.2 10*3/uL (ref 0.0–0.5)
Eosinophils Relative: 4 %
HCT: 47 % — ABNORMAL HIGH (ref 36.0–46.0)
Hemoglobin: 15.2 g/dL — ABNORMAL HIGH (ref 12.0–15.0)
Immature Granulocytes: 0 %
Lymphocytes Relative: 19 %
Lymphs Abs: 1.3 10*3/uL (ref 0.7–4.0)
MCH: 29.4 pg (ref 26.0–34.0)
MCHC: 32.3 g/dL (ref 30.0–36.0)
MCV: 90.9 fL (ref 80.0–100.0)
Monocytes Absolute: 0.4 10*3/uL (ref 0.1–1.0)
Monocytes Relative: 6 %
Neutro Abs: 4.6 10*3/uL (ref 1.7–7.7)
Neutrophils Relative %: 71 %
Platelet Count: 187 10*3/uL (ref 150–400)
RBC: 5.17 MIL/uL — ABNORMAL HIGH (ref 3.87–5.11)
RDW: 12.4 % (ref 11.5–15.5)
WBC Count: 6.5 10*3/uL (ref 4.0–10.5)
nRBC: 0 % (ref 0.0–0.2)

## 2024-01-20 LAB — CMP (CANCER CENTER ONLY)
ALT: 23 U/L (ref 0–44)
AST: 21 U/L (ref 15–41)
Albumin: 4.7 g/dL (ref 3.5–5.0)
Alkaline Phosphatase: 121 U/L (ref 38–126)
Anion gap: 12 (ref 5–15)
BUN: 8 mg/dL (ref 8–23)
CO2: 25 mmol/L (ref 22–32)
Calcium: 10 mg/dL (ref 8.9–10.3)
Chloride: 104 mmol/L (ref 98–111)
Creatinine: 0.71 mg/dL (ref 0.44–1.00)
GFR, Estimated: >60 mL/min (ref >60)
Glucose, Bld: 92 mg/dL (ref 70–99)
Potassium: 4.5 mmol/L (ref 3.5–5.1)
Sodium: 141 mmol/L (ref 135–145)
Total Bilirubin: 0.3 mg/dL (ref 0.0–1.2)
Total Protein: 7.5 g/dL (ref 6.5–8.1)

## 2024-01-20 LAB — COMPREHENSIVE METABOLIC PANEL WITH GFR
ALT: 23 U/L (ref 0–44)
AST: 19 U/L (ref 15–41)
Albumin: 4 g/dL (ref 3.5–5.0)
Alkaline Phosphatase: 85 U/L (ref 38–126)
Anion gap: 10 (ref 5–15)
BUN: 7 mg/dL — ABNORMAL LOW (ref 8–23)
CO2: 24 mmol/L (ref 22–32)
Calcium: 9.6 mg/dL (ref 8.9–10.3)
Chloride: 104 mmol/L (ref 98–111)
Creatinine, Ser: 0.6 mg/dL (ref 0.44–1.00)
GFR, Estimated: >60 mL/min (ref >60)
Glucose, Bld: 94 mg/dL (ref 70–99)
Potassium: 4 mmol/L (ref 3.5–5.1)
Sodium: 138 mmol/L (ref 135–145)
Total Bilirubin: 0.7 mg/dL (ref 0.0–1.2)
Total Protein: 7.3 g/dL (ref 6.5–8.1)

## 2024-01-20 LAB — PROTIME-INR
INR: 0.9 (ref 0.8–1.2)
Prothrombin Time: 13.1 s (ref 11.4–15.2)

## 2024-01-20 LAB — HIV ANTIBODY (ROUTINE TESTING W REFLEX): HIV Screen 4th Generation wRfx: NONREACTIVE

## 2024-01-20 NOTE — H&P (Signed)
 History and Physical    Patient: Dana Roth FMW:[id number] DOB: [birth year] DOA: 01/20/2024 DOS: the patient was seen and examined on 01/20/2024 PCP: Rosalea [name], PA  Patient coming from: Home  Chief Complaint:  Chief Complaint  Patient presents with   Aphasia   HPI: Dana Roth is a 63 y.o. female with medical history significant of SCLC, HTN, HLD, GERD, and depression p/w dysphagia and found to have new  brain mets on CT.  Pt was in her USOH until this past Saturday when she had dribbling around her mouth while trying to drink water. The difficulty swallowing persisted all weekend, but the pt didn't make much of it until today when she presented to her OP oncology appointment. At the appointment, the providers were concerned about her difficulty finding words and swallowing; as such, the activated EMS and had her BIBA to [facility] ED for stroke evaluation.  At the time of admission, ptwas hypertesnive and tachypneic on RA. Labs were unremarkable. HCT showed new multifocal brain mets. Pt admitted to medicine with oncology following.  Review of Systems: As mentioned in the history of present illness. All other systems reviewed and are negative. Past Medical History:  Diagnosis Date   Arthritis    Complication of anesthesia    problem with B/P dropping   Depression    Dyslipidemia    GERD (gastroesophageal reflux disease)    HTN (hypertension)    only with MD visits, caused by anxiety,med taken caused chest pain-so stopped.   Multiple osteochondroma    Multiple bone surgeries   Needle phobia    PONV (postoperative nausea and vomiting)    Rectal bleeding    Small cell lung cancer (HCC)    Suspicious mole    Past Surgical History:  Procedure Laterality Date   bone tumors     Multiple excisions-all over body occ. bones lock up   BRONCHIAL BIOPSY  06/30/2023   Procedure: BRONCHIAL BIOPSIES;  Surgeon: Brenna Adine CROME, DO;  Location: [facility] ENDOSCOPY;  Service: Pulmonary;;    CHOLECYSTECTOMY     COLONOSCOPY WITH PROPOFOL  N/A 06/02/2013   Procedure: COLONOSCOPY WITH PROPOFOL ;  Surgeon: Jerrell KYM Sol, MD;  Location: [facility] ENDOSCOPY;  Service: Endoscopy;  Laterality: N/A;   COLONOSCOPY WITH PROPOFOL  N/A 05/07/2017   Procedure: COLONOSCOPY WITH PROPOFOL ;  Surgeon: Sol Jerrell, MD;  Location: [facility] ENDOSCOPY;  Service: Endoscopy;  Laterality: N/A;   HEMOSTASIS CONTROL  06/30/2023   Procedure: HEMOSTASIS CONTROL;  Surgeon: Brenna Adine CROME, DO;  Location: [facility] ENDOSCOPY;  Service: Pulmonary;;   HOT HEMOSTASIS N/A 05/07/2017   Procedure: HOT HEMOSTASIS (ARGON PLASMA COAGULATION/BICAP);  Surgeon: Sol Jerrell, MD;  Location: [facility] ENDOSCOPY;  Service: Endoscopy;  Laterality: N/A;   IR CV LINE INJECTION  11/24/2023   IR IMAGING GUIDED PORT INSERTION  11/27/2023   IR REMOVAL TUN ACCESS W/ PORT W/O FL MOD SED  11/27/2023   IR REMOVE CV FIBRIN SHEATH  11/27/2023   IR TRANSCATH RETRIEVAL FB INCL GUIDANCE (MS)  09/02/2023   IR US  GUIDE VASC ACCESS RIGHT  09/02/2023   IR VENO/JUGULAR RIGHT  09/02/2023   PORTA CATH INSERTION     VIDEO BRONCHOSCOPY N/A 06/30/2023   Procedure: VIDEO BRONCHOSCOPY;  Surgeon: Brenna Adine CROME, DO;  Location: [facility] ENDOSCOPY;  Service: Pulmonary;  Laterality: N/A;   Social History:  reports that she has been smoking cigarettes. She has a 40 pack-year smoking history. She has never used smokeless tobacco. She reports current drug use. Frequency: 7.00 times per week.  Drug: Marijuana. She reports that she does not drink alcohol.  Allergies  Allergen Reactions   Codeine     Family History  Problem Relation Age of Onset   Heart disease Brother        Hole in heart   CAD Sister     Prior to Admission medications   Medication Sig Start Date End Date Taking? Authorizing Provider  albuterol  (VENTOLIN  HFA) 108 (90 Base) MCG/ACT inhaler Inhale 2 puffs into the lungs every 6 (six) hours as needed for wheezing or shortness of breath. 08/26/23   Ezzard,  Dequincy A, MD  budesonide -formoterol  (SYMBICORT ) 80-4.5 MCG/ACT inhaler Inhale 2 puffs into the lungs 2 (two) times daily. 09/04/23   Ruthell Lauraine FALCON, NP  busPIRone (BUSPAR) 10 MG tablet     [provider]  cetirizine (ZYRTEC) 10 MG tablet Take 10 mg by mouth daily. 11/10/23   [provider]  clonazePAM (KLONOPIN) 0.5 MG tablet Take 0.5 mg by mouth 3 (three) times daily as needed.    [provider]  cyclobenzaprine (FLEXERIL) 5 MG tablet     [provider]  Fluticasone-Umeclidin-Vilant (TRELEGY ELLIPTA ) 100-62.5-25 MCG/ACT AEPB Inhale 1 puff into the lungs daily. 09/01/23   Ruthell Lauraine FALCON, NP  gabapentin (NEURONTIN) 400 MG capsule     [provider]  HYDROcodone -acetaminophen  (NORCO) 7.5-325 MG tablet Take 1 tablet by mouth every 6 (six) hours as needed for moderate pain (pain score 4-6). Patient not taking: Reported on 12/09/2023 08/26/23   Ezzard Peters A, MD  ipratropium-albuterol  (DUONEB) 0.5-2.5 (3) MG/3ML SOLN Take 3 mLs by nebulization every 6 (six) hours as needed. 10/15/23   Lewis, Dequincy A, MD  loratadine (CLARITIN) 10 MG tablet Take 10 mg by mouth daily. For 5 days, starting day she receives WBC injection. Repeat each cycle. 08/10/23   [provider]  meloxicam (MOBIC) 15 MG tablet     [provider]  nicotine  (NICODERM CQ  - DOSED IN MG/24 HOURS) 14 mg/24hr patch Place 14 mg onto the skin daily.    [provider]  ondansetron  (ZOFRAN -ODT) 4 MG disintegrating tablet Take 1 tablet (4 mg total) by mouth every 6 (six) hours as needed for nausea or vomiting. 08/31/23   Ezzard Peters A, MD  polyethylene glycol powder (MIRALAX) 17 GM/SCOOP powder  10/15/21   [provider]  prochlorperazine  (COMPAZINE ) 10 MG tablet Take 1 tablet (10 mg total) by mouth every 6 (six) hours as needed for nausea or vomiting. 08/31/23   Ezzard, Dequincy A, MD  sennosides-docusate sodium (SENOKOT-S) 8.6-50 MG tablet Take 2 tablets by  mouth in the morning and at bedtime. 08/07/23   [provider]  tiZANidine (ZANAFLEX) 4 MG capsule Take 4 mg by mouth 3 (three) times daily as needed for muscle spasms.    [provider]    Physical Exam: Vitals:   01/20/24 1031 01/20/24 1045 01/20/24 1100 01/20/24 1230  BP: (!) 161/88 (!) 148/93 (!) 164/89 111/77  Pulse: 90 90 93 87  Resp: (!) 28 (!) 24 (!) 28 (!) 23  Temp: 97.9 F (36.6 C)     TempSrc: Oral     SpO2: 100% 100% 100% 99%  Weight:      Height:       General: Alert, oriented x3, resting comfortably in no acute distress HEENT: EOMI, oropharynx clear, moist mucous membranes, hearing intact Neck: Trachea midline and no gross thyromegaly Respiratory: Lungs clear to auscultation bilaterally with normal respiratory effort; no w/r/r Cardiovascular:  Regular rate and rhythm w/o m/r/g Abdomen: Soft, nontender, nondistended. Positive bowel sounds MSK: No obvious joint deformities or swelling Skin: No obvious rashes or lesions Neurologic: Awake, alert, spontaneously moves all extremities, strength intact Psychiatric: Appropriate mood and affect, conversational and cooperative  Data Reviewed:  Lab Results  Component Value Date   WBC 6.4 01/20/2024   HGB 15.0 01/20/2024   HCT 45.8 01/20/2024   MCV 91.1 01/20/2024   PLT 190 01/20/2024   Lab Results  Component Value Date   GLUCOSE 94 01/20/2024   CALCIUM 9.6 01/20/2024   NA 138 01/20/2024   K 4.0 01/20/2024   CO2 24 01/20/2024   CL 104 01/20/2024   BUN 7 (L) 01/20/2024   CREATININE 0.60 01/20/2024   Lab Results  Component Value Date   ALT 23 01/20/2024   AST 19 01/20/2024   ALKPHOS 85 01/20/2024   BILITOT 0.7 01/20/2024   Lab Results  Component Value Date   INR 0.9 01/20/2024    Radiology: CT Head Wo Contrast Result Date: 01/20/2024 CLINICAL DATA:  63 year old female with lung cancer. Beginning 5 days ago difficulty swallowing, aphasia, blurred vision on the right side. EXAM: CT HEAD  WITHOUT CONTRAST TECHNIQUE: Contiguous axial images were obtained from the base of the skull through the vertex without intravenous contrast. RADIATION DOSE REDUCTION: This exam was performed according to the departmental dose-optimization program which includes automated exposure control, adjustment of the mA and/or kV according to patient size and/or use of iterative reconstruction technique. COMPARISON:  Brain MRI 08/06/2023 [facility] FINDINGS: Brain: New from 08/06/2023 multifocal centrally cystic and rim hyperdense brain masses including left cerebellum (3.2 cm long axis), left frontal operculum (2.8 cm long axis), anterior right middle frontal gyrus (1.3 cm long axis). Additional hyperdense masslike density anterior left middle frontal gyrus with subjacent vasogenic edema type white matter hypodensity (series 2, image 20 and coronal image 50, roughly estimated 1.9 cm). No other conspicuous vasogenic edema. Despite the multiple intracranial masses there is no significant midline shift. Only mild mass effect on the ventricles. Basilar cisterns remain patent. No ventriculomegaly. No acute intracranial hemorrhage identified. No cortically based acute infarct identified. Vascular: No suspicious intracranial vascular hyperdensity. Skull: Intact.  No acute or suspicious osseous lesion identified. Sinuses/Orbits: Visualized paranasal sinuses and mastoids are stable and well aerated. Other: Visualized orbits and scalp soft tissues are within normal limits. IMPRESSION: 1. Positive for multifocal Brain metastases, new from staging MRI in January. Largest mass visible by CT is in the left cerebellum (3.2 cm). Most pronounced vasogenic edema is in the anterior left middle frontal gyrus. 2. No significant intracranial mass effect or midline shift at this time. Electronically Signed   By: VEAR Hurst M.D.   On: 01/20/2024 11:20    Assessment and Plan: 34F h/o SCLC, HTN, HLD, GERD,  and depression p/w dysphagia and found to have new  brain mets on CT.  Presumed metastatic brain cancer H/o SCLC -Oncology consulted per EDP; apprec eval/recs -SLP consulted; appre eval/recs -PT/OT consulted; apprec eval/recs -Defer palliative care consult for now pending ongoing discussion with Oncology  Advance Care Planning:   Code Status: Full Code   Consults: Oncology  Family Communication: N/A  Severity of Illness: The appropriate patient status for this patient is INPATIENT. Inpatient status is judged to be reasonable and necessary in order to provide the required intensity of service to ensure the patient's safety. The patient's presenting symptoms, physical exam findings, and initial radiographic  and laboratory data in the context of their chronic comorbidities is felt to place them at high risk for further clinical deterioration. Furthermore, it is not anticipated that the patient will be medically stable for discharge from the hospital within 2 midnights of admission.   * I certify that at the point of admission it is my clinical judgment that the patient will require inpatient hospital care spanning beyond 2 midnights from the point of admission due to high intensity of service, high risk for further deterioration and high frequency of surveillance required.*   ------- I spent 55 minutes reviewing previous labs/notes, obtaining separate history at the bedside, counseling/discussing the treatment plan outlined above, ordering medications/tests, and performing clinical documentation.  Author: Marsha Ada, MD 01/20/2024 1:50 PM  For on call review [email or web address].

## 2024-01-20 NOTE — ED Provider Notes (Signed)
 [place] EMERGENCY DEPARTMENT AT [facility] Provider Note   CSN: [id number] Arrival date & time: 01/20/24  1018     Patient presents with: Aphasia   Dana Roth is a 63 y.o. female.   HPI  Presents because of difficulty speaking as well as maybe a slight facial droop on the right side that started approximately 5 days ago.  Patient states that she noticed this 5 days ago.  Lives at home.  She is not following.  Denies any obvious weakness that she is aware of.  She just feels like she is having difficulty time finding words.  She denies vision changes.  No diplopia.  No visual field cuts.  Denies any chest pain or shortness of breath beyond her baseline.  No nausea vomit diarrhea.  No dysuria.   Previous medical history reviewed : Patient went for blood drawl today oncology clinic.  At that time, she has been feeling well for about 5 days.  Difficulty swallowing.  Unable to speak.  Was sent to the ED for further evaluation.  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     Prior to Admission medications   Medication Sig Start Date End Date Taking? Authorizing Provider  albuterol  (VENTOLIN  HFA) 108 (90 Base) MCG/ACT inhaler Inhale 2 puffs into the lungs every 6 (six) hours as needed for wheezing or shortness of breath. 08/26/23   Lewis, Dequincy A, MD  budesonide -formoterol  (SYMBICORT ) 80-4.5 MCG/ACT inhaler Inhale 2 puffs into the lungs 2 (two) times daily. 09/04/23   Ruthell Lauraine FALCON, NP  busPIRone (BUSPAR) 10 MG tablet     [provider]  cetirizine (ZYRTEC) 10 MG tablet Take 10 mg by mouth daily. 11/10/23   [provider]  clonazePAM (KLONOPIN) 0.5 MG tablet Take 0.5 mg by mouth 3 (three) times daily as needed.    [provider]  cyclobenzaprine (FLEXERIL) 5 MG tablet     [provider]  Fluticasone-Umeclidin-Vilant (TRELEGY ELLIPTA ) 100-62.5-25 MCG/ACT AEPB Inhale 1 puff into the lungs daily.  09/01/23   Ruthell Lauraine FALCON, NP  gabapentin (NEURONTIN) 400 MG capsule     [provider]  HYDROcodone -acetaminophen  (NORCO) 7.5-325 MG tablet Take 1 tablet by mouth every 6 (six) hours as needed for moderate pain (pain score 4-6). Patient not taking: Reported on 12/09/2023 08/26/23   Ezzard Peters A, MD  ipratropium-albuterol  (DUONEB) 0.5-2.5 (3) MG/3ML SOLN Take 3 mLs by nebulization every 6 (six) hours as needed. 10/15/23   Lewis, Dequincy A, MD  loratadine (CLARITIN) 10 MG tablet Take 10 mg by mouth daily. For 5 days, starting day she receives WBC injection. Repeat each cycle. 08/10/23   [provider]  meloxicam (MOBIC) 15 MG tablet     [provider]  nicotine  (NICODERM CQ  - DOSED IN MG/24 HOURS) 14 mg/24hr patch Place 14 mg onto the skin daily.    [provider]  ondansetron  (ZOFRAN -ODT) 4 MG disintegrating tablet Take 1 tablet (4 mg total) by mouth every 6 (six) hours as needed for nausea or vomiting. 08/31/23   Ezzard, Dequincy A, MD  polyethylene glycol powder (MIRALAX) 17 GM/SCOOP powder  10/15/21   [provider]  prochlorperazine  (COMPAZINE ) 10 MG tablet Take 1 tablet (10 mg total) by mouth every 6 (six) hours as needed for nausea or vomiting. 08/31/23   Ezzard, Dequincy A, MD  sennosides-docusate sodium (SENOKOT-S) 8.6-50 MG tablet Take 2 tablets by mouth in the morning  and at bedtime. 08/07/23   [provider]  tiZANidine (ZANAFLEX) 4 MG capsule Take 4 mg by mouth 3 (three) times daily as needed for muscle spasms.    [provider]    Allergies: Codeine    Review of Systems  Constitutional:  Negative for chills and fever.  HENT:  Negative for ear pain and sore throat.   Eyes:  Negative for pain and visual disturbance.  Respiratory:  Negative for cough and shortness of breath.   Cardiovascular:  Negative for chest pain and palpitations.  Gastrointestinal:  Negative for abdominal pain and vomiting.  Genitourinary:   Negative for dysuria and hematuria.  Musculoskeletal:  Negative for arthralgias and back pain.  Skin:  Negative for color change and rash.  Neurological:  Positive for facial asymmetry and speech difficulty. Negative for seizures and syncope.  All other systems reviewed and are negative.   Updated Vital Signs BP 111/77   Pulse 87   Temp 97.9 F (36.6 C) (Oral)   Resp (!) 23   Ht 5' 4 (1.626 m)   Wt 78 kg   SpO2 99%   BMI 29.52 kg/m   Physical Exam Vitals and nursing note reviewed.  Constitutional:      General: She is not in acute distress.    Appearance: She is well-developed.  HENT:     Head: Normocephalic and atraumatic.  Eyes:     Conjunctiva/sclera: Conjunctivae normal.  Cardiovascular:     Rate and Rhythm: Normal rate and regular rhythm.     Heart sounds: No murmur heard. Pulmonary:     Effort: Pulmonary effort is normal. No respiratory distress.     Breath sounds: Normal breath sounds.  Abdominal:     Palpations: Abdomen is soft.     Tenderness: There is no abdominal tenderness.  Musculoskeletal:        General: No swelling.     Cervical back: Neck supple.  Skin:    General: Skin is warm and dry.     Capillary Refill: Capillary refill takes less than 2 seconds.  Neurological:     Mental Status: She is alert and oriented to person, place, and time.     GCS: GCS eye subscore is 4. GCS verbal subscore is 5. GCS motor subscore is 6.     Cranial Nerves: Dysarthria and facial asymmetry present.     Sensory: Sensory deficit present.     Motor: Motor function is intact. No weakness or pronator drift.     Coordination: Coordination is intact. Romberg sign negative.     Comments: Patient has a right lower facial droop as well as sensory changes in the right lower face.  Positive aphasia with some slight dysarthria as well.  Otherwise, strength and sensation intact in bilateral upper and lower extremities.  Normal finger-nose.  No visual field cuts.  Psychiatric:         Mood and Affect: Mood normal.     (all labs ordered are listed, but only abnormal results are displayed) Labs Reviewed  COMPREHENSIVE METABOLIC PANEL WITH GFR - Abnormal; Notable for the following components:      Result Value   BUN 7 (*)    All other components within normal limits  CBC WITH DIFFERENTIAL/PLATELET  PROTIME-INR  URINALYSIS, ROUTINE W REFLEX MICROSCOPIC  HIV ANTIBODY (ROUTINE TESTING W REFLEX)    EKG: None  Radiology: CT Head Wo Contrast Result Date: 01/20/2024 CLINICAL DATA:  63 year old female with lung cancer. Beginning 5 days ago  difficulty swallowing, aphasia, blurred vision on the right side. EXAM: CT HEAD WITHOUT CONTRAST TECHNIQUE: Contiguous axial images were obtained from the base of the skull through the vertex without intravenous contrast. RADIATION DOSE REDUCTION: This exam was performed according to the departmental dose-optimization program which includes automated exposure control, adjustment of the mA and/or kV according to patient size and/or use of iterative reconstruction technique. COMPARISON:  Brain MRI 08/06/2023 [facility] FINDINGS: Brain: New from 08/06/2023 multifocal centrally cystic and rim hyperdense brain masses including left cerebellum (3.2 cm long axis), left frontal operculum (2.8 cm long axis), anterior right middle frontal gyrus (1.3 cm long axis). Additional hyperdense masslike density anterior left middle frontal gyrus with subjacent vasogenic edema type white matter hypodensity (series 2, image 20 and coronal image 50, roughly estimated 1.9 cm). No other conspicuous vasogenic edema. Despite the multiple intracranial masses there is no significant midline shift. Only mild mass effect on the ventricles. Basilar cisterns remain patent. No ventriculomegaly. No acute intracranial hemorrhage identified. No cortically based acute infarct identified. Vascular: No suspicious intracranial vascular  hyperdensity. Skull: Intact.  No acute or suspicious osseous lesion identified. Sinuses/Orbits: Visualized paranasal sinuses and mastoids are stable and well aerated. Other: Visualized orbits and scalp soft tissues are within normal limits. IMPRESSION: 1. Positive for multifocal Brain metastases, new from staging MRI in January. Largest mass visible by CT is in the left cerebellum (3.2 cm). Most pronounced vasogenic edema is in the anterior left middle frontal gyrus. 2. No significant intracranial mass effect or midline shift at this time. Electronically Signed   By: VEAR Hurst M.D.   On: 01/20/2024 11:20     Procedures   Medications Ordered in the ED - No data to display                    NIH Stroke Scale: 4              Medical Decision Making Amount and/or Complexity of Data Reviewed Labs: ordered. Radiology: ordered.  Risk Decision regarding hospitalization.   Presents because of difficulty speaking as well as maybe a slight facial droop on the right side that started approximately 5 days ago.  Patient states that she noticed this 5 days ago.  Lives at home.  She is not following.  Denies any obvious weakness that she is aware of.  She just feels like she is having difficulty time finding words.  She denies vision changes.  No diplopia.  No visual field cuts.  Denies any chest pain or shortness of breath beyond her baseline.  No nausea vomit diarrhea.  No dysuria.  Previous medical history reviewed : Patient went for blood drawl today oncology clinic.  At that time, she has been feeling well for about 5 days.  Difficulty swallowing.  Unable to speak.  Was sent to the ED for further evaluation.  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  On exam, patient alert and orient x 3 with a GCS of 15.  Patient does have slight right lower facial droop.  Positive dysarthria with some slight aphasia as well.  Sensory changes in the right lower face as  well.  Patient's symptoms been present for the past 5 days.  All consistent for likely subacute CVA.  Patient is adamant that it has been approximately 5 days.  Patient is  well outside of window for any kind of intervention  at this point time.   Obtain CT scan.  Unfortunately, does show metastatic disease to the brain.  So some slight vasogenic edema.  No evidence of midline shift.  Did speak to oncology on-call Dr. Gatha who recommended inpatient admission.   Patient unfortunately failed her swallow screen as well.  Will need speech therapy.  Admitted for further care.         Final diagnoses:  Metastatic cancer to brain [facility])  Dysarthria  Slurred speech    ED Discharge Orders     None          Simon Lavonia SAILOR, MD 01/20/24 1316

## 2024-01-20 NOTE — ED Triage Notes (Signed)
 Pt coming in from med center in [place] where she gets treatment for her lung cancer. 5 days ago pt reports difficulty swallowing, aphasia and blurred vision on the right side that came on suddenly wile she was eating.   Ems vitals  148/94 Hr 92 97% on ra Cbg 88

## 2024-01-20 NOTE — Progress Notes (Signed)
 9089 THE PATIENT WAS HERE TODAY FOR BLOOD DRAW. THE PATIENT HAS BEEN FEELING OFF FOR ABOUT 5 DAY. DIFFICULTY SWALLOWING, UNABLE TO SPEAK.THE PATIENT BLOOD PRESSURE IS 174/122, THE PATIENT THINKS SHE HAS HAD A STROKE. MELISSA PARSONS, NP AWARE AND IS ASSESSING THE PATIENT. WE WILL SEND PATIENT TO [facility]IA AMBULANCE TODAY. DR. EZZARD MADE AWARE AND HAS ASSESSED THE PATIENT AS WELL.PATIENT FAMILY HAS BEEN INFORMED AS WELL.

## 2024-01-20 NOTE — Evaluation (Signed)
 Clinical/Bedside Swallow Evaluation Patient Details  Name: Dana Roth MRN: [id number] Date of Birth: May 12, 1961  Today's Date: 01/20/2024 Time: SLP Start Time (ACUTE ONLY): 1550 SLP Stop Time (ACUTE ONLY): 1625 SLP Time Calculation (min) (ACUTE ONLY): 35 min  Past Medical History:  Past Medical History:  Diagnosis Date   Arthritis    Complication of anesthesia    problem with B/P dropping   Depression    Dyslipidemia    GERD (gastroesophageal reflux disease)    HTN (hypertension)    only with MD visits, caused by anxiety,med taken caused chest pain-so stopped.   Multiple osteochondroma    Multiple bone surgeries   Needle phobia    PONV (postoperative nausea and vomiting)    Rectal bleeding    Small cell lung cancer (HCC)    Suspicious mole    Past Surgical History:  Past Surgical History:  Procedure Laterality Date   bone tumors     Multiple excisions-all over body occ. bones lock up   BRONCHIAL BIOPSY  06/30/2023   Procedure: BRONCHIAL BIOPSIES;  Surgeon: Brenna Adine CROME, DO;  Location: [facility] ENDOSCOPY;  Service: Pulmonary;;   CHOLECYSTECTOMY     COLONOSCOPY WITH PROPOFOL  N/A 06/02/2013   Procedure: COLONOSCOPY WITH PROPOFOL ;  Surgeon: Jerrell KYM Sol, MD;  Location: [facility] ENDOSCOPY;  Service: Endoscopy;  Laterality: N/A;   COLONOSCOPY WITH PROPOFOL  N/A 05/07/2017   Procedure: COLONOSCOPY WITH PROPOFOL ;  Surgeon: Sol Jerrell, MD;  Location: [facility] ENDOSCOPY;  Service: Endoscopy;  Laterality: N/A;   HEMOSTASIS CONTROL  06/30/2023   Procedure: HEMOSTASIS CONTROL;  Surgeon: Brenna Adine CROME, DO;  Location: [facility] ENDOSCOPY;  Service: Pulmonary;;   HOT HEMOSTASIS N/A 05/07/2017   Procedure: HOT HEMOSTASIS (ARGON PLASMA COAGULATION/BICAP);  Surgeon: Sol Jerrell, MD;  Location: [facility] ENDOSCOPY;  Service: Endoscopy;  Laterality: N/A;   IR CV LINE INJECTION  11/24/2023   IR IMAGING GUIDED PORT INSERTION  11/27/2023   IR REMOVAL TUN ACCESS W/ PORT W/O FL MOD SED  11/27/2023   IR  REMOVE CV FIBRIN SHEATH  11/27/2023   IR TRANSCATH RETRIEVAL FB INCL GUIDANCE (MS)  09/02/2023   IR US  GUIDE VASC ACCESS RIGHT  09/02/2023   IR VENO/JUGULAR RIGHT  09/02/2023   PORTA CATH INSERTION     VIDEO BRONCHOSCOPY N/A 06/30/2023   Procedure: VIDEO BRONCHOSCOPY;  Surgeon: Brenna Adine CROME, DO;  Location: [facility] ENDOSCOPY;  Service: Pulmonary;  Laterality: N/A;   HPI:  Dana Roth is a 63 yo female presenting to ED from her OP oncologist for concern over deficits related to word finding and swallowing. CTH shows multifocal brain metastases, new from staging MRI 07/2023 with the largest in the L cerebellum. MRI pending. PMH includes SCLD, HTN, HLD, GERD, depression    Assessment / Plan / Recommendation  Clinical Impression  Pt reports recent onset of dysphagia, primarily characterized by oral deficits. She presents with moderate R sided facial and lingual asymmetry. She has upper dentures in place and although she has lower dentures, does not wear them. Pt's voice is hoarse in quality and as the evaluation progressed, she appeared to become increasingly stridorous with increased WOB noted. She coughs intermittently after thin liquids and subjectively states that it feels as if things are going down the wrong way. With all trials of thin liquids and purees, pt swallows multiple times with perceivable effort seemingly required to clear each bolus. Recommend proceeding with an MBS prior to diet initiation given acute signs of dysphagia. Pending MBS, recommend NPO status be maintained  with the exception of ice chips after oral care and meds crushed with puree. SLP will f/u.  Of note, pt also presents with word finding deficits at the conversation level. May consider order for cognitive-linguistic evaluation.   SLP Visit Diagnosis: Dysphagia, unspecified (R13.10)    Aspiration Risk  Mild aspiration risk    Diet Recommendation NPO except meds;Ice chips PRN after oral care    Medication  Administration: Crushed with puree    Other  Recommendations Oral Care Recommendations: Oral care QID;Oral care prior to ice chip/H20     Assistance Recommended at Discharge    Functional Status Assessment Patient has had a recent decline in their functional status and demonstrates the ability to make significant improvements in function in a reasonable and predictable amount of time.  Frequency and Duration min 2x/week  2 weeks       Prognosis Prognosis for improved oropharyngeal function: Good Barriers to Reach Goals: Language deficits;Severity of deficits      Swallow Study   General HPI: Dana Roth is a 63 yo female presenting to ED from her OP oncologist for concern over deficits related to word finding and swallowing. CTH shows multifocal brain metastases, new from staging MRI 07/2023 with the largest in the L cerebellum. MRI pending. PMH includes SCLD, HTN, HLD, GERD, depression Type of Study: Bedside Swallow Evaluation Previous Swallow Assessment: none in chart Diet Prior to this Study: NPO Temperature Spikes Noted: No Respiratory Status: Room air History of Recent Intubation: No Behavior/Cognition: Alert;Cooperative;Pleasant mood Oral Cavity Assessment: Within Functional Limits Oral Care Completed by SLP: No Oral Cavity - Dentition: Dentures, top;Dentures, bottom (pt has bottom dentures but does not wear them) Vision: Functional for self-feeding Self-Feeding Abilities: Able to feed self Patient Positioning: Upright in bed Baseline Vocal Quality: Hoarse Volitional Cough: Strong Volitional Swallow: Able to elicit    Oral/Motor/Sensory Function Overall Oral Motor/Sensory Function: Moderate impairment Facial ROM: Reduced right;Suspected CN VII (facial) dysfunction Facial Symmetry: Abnormal symmetry right;Suspected CN VII (facial) dysfunction Facial Strength: Reduced right;Suspected CN VII (facial) dysfunction Lingual ROM: Reduced right;Suspected CN XII (hypoglossal)  dysfunction Lingual Symmetry: Abnormal symmetry right;Suspected CN XII (hypoglossal) dysfunction   Ice Chips Ice chips: Not tested   Thin Liquid Thin Liquid: Impaired Presentation: Straw;Self Fed Oral Phase Functional Implications: Right anterior spillage Pharyngeal  Phase Impairments: Multiple swallows;Wet Vocal Quality;Cough - Immediate    Nectar Thick Nectar Thick Liquid: Not tested   Honey Thick Honey Thick Liquid: Not tested   Puree Puree: Impaired Presentation: Spoon;Self Fed Pharyngeal Phase Impairments: Multiple swallows   Solid     Solid: Not tested      Damien Blumenthal, M.A., CCC-SLP Speech Language Pathology, Acute Rehabilitation Services  Secure Chat preferred [phone]  01/20/2024,5:04 PM

## 2024-01-21 ENCOUNTER — Inpatient Hospital Stay (HOSPITAL_COMMUNITY)

## 2024-01-21 DIAGNOSIS — R1310 Dysphagia, unspecified: Secondary | ICD-10-CM

## 2024-01-21 DIAGNOSIS — C7931 Secondary malignant neoplasm of brain: Secondary | ICD-10-CM | POA: Diagnosis present

## 2024-01-21 LAB — BASIC METABOLIC PANEL WITH GFR
Anion gap: 9 (ref 5–15)
BUN: 7 mg/dL — ABNORMAL LOW (ref 8–23)
CO2: 25 mmol/L (ref 22–32)
Calcium: 9.6 mg/dL (ref 8.9–10.3)
Chloride: 103 mmol/L (ref 98–111)
Creatinine, Ser: 0.92 mg/dL (ref 0.44–1.00)
GFR, Estimated: >60 mL/min (ref >60)
Glucose, Bld: 104 mg/dL — ABNORMAL HIGH (ref 70–99)
Potassium: 4 mmol/L (ref 3.5–5.1)
Sodium: 137 mmol/L (ref 135–145)

## 2024-01-21 MED ORDER — DEXAMETHASONE SODIUM PHOSPHATE 4 MG/ML IJ SOLN
4.0000 mg | Freq: Three times a day (TID) | INTRAMUSCULAR | Status: DC
  Administered 2024-01-21: 4 mg via INTRAVENOUS
  Filled 2024-01-21: qty 1

## 2024-01-21 MED ORDER — LEVETIRACETAM (KEPPRA) 500 MG/5 ML ADULT IV PUSH
500.0000 mg | Freq: Two times a day (BID) | INTRAVENOUS | Status: DC
  Administered 2024-01-21: 500 mg via INTRAVENOUS
  Filled 2024-01-21: qty 5

## 2024-01-21 MED ORDER — ENOXAPARIN SODIUM 40 MG/0.4ML IJ SOSY: 40.0000 mg | PREFILLED_SYRINGE | INTRAMUSCULAR | Status: DC

## 2024-01-21 MED ORDER — NICOTINE 14 MG/24HR TD PT24
14.0000 mg | MEDICATED_PATCH | Freq: Every day | TRANSDERMAL | Status: DC
  Administered 2024-01-21: 14 mg via TRANSDERMAL
  Filled 2024-01-21: qty 1

## 2024-01-21 MED ORDER — DEXAMETHASONE SODIUM PHOSPHATE 4 MG/ML IJ SOLN
4.0000 mg | Freq: Four times a day (QID) | INTRAMUSCULAR | Status: DC
  Administered 2024-01-21: 4 mg via INTRAVENOUS
  Filled 2024-01-21: qty 1

## 2024-01-21 MED ORDER — IPRATROPIUM-ALBUTEROL 0.5-2.5 (3) MG/3ML IN SOLN: 3.0000 mL | Freq: Four times a day (QID) | RESPIRATORY_TRACT | Status: DC | PRN

## 2024-01-21 MED ORDER — REVEFENACIN 175 MCG/3ML IN SOLN: 175.0000 ug | Freq: Every day | RESPIRATORY_TRACT | Status: DC

## 2024-01-21 MED ORDER — LORAZEPAM 0.5 MG PO TABS: 0.5000 mg | ORAL_TABLET | Freq: Three times a day (TID) | ORAL | Status: DC | PRN

## 2024-01-21 MED ORDER — ARFORMOTEROL TARTRATE 15 MCG/2ML IN NEBU: 15.0000 ug | INHALATION_SOLUTION | Freq: Two times a day (BID) | RESPIRATORY_TRACT | Status: DC

## 2024-01-21 MED ORDER — HEPARIN SOD (PORK) LOCK FLUSH 100 UNIT/ML IV SOLN
500.0000 [IU] | INTRAVENOUS | Status: AC | PRN
  Administered 2024-01-21: 500 [IU]
  Filled 2024-01-21: qty 5

## 2024-01-21 MED ORDER — LEVETIRACETAM 500 MG PO TABS: 500.0000 mg | ORAL_TABLET | Freq: Two times a day (BID) | ORAL | 0 refills | Status: DC

## 2024-01-21 MED ORDER — DEXAMETHASONE 2 MG PO TABS: 4.0000 mg | ORAL_TABLET | Freq: Three times a day (TID) | ORAL | 0 refills | Status: DC

## 2024-01-21 MED ORDER — BUDESONIDE 0.5 MG/2ML IN SUSP: 0.5000 mg | Freq: Two times a day (BID) | RESPIRATORY_TRACT | Status: DC

## 2024-01-21 MED ORDER — LORAZEPAM 2 MG/ML IJ SOLN
0.5000 mg | Freq: Two times a day (BID) | INTRAMUSCULAR | Status: DC | PRN
  Administered 2024-01-21: 0.5 mg via INTRAVENOUS
  Filled 2024-01-21: qty 1

## 2024-01-21 MED ORDER — HYDRALAZINE HCL 20 MG/ML IJ SOLN: 5.0000 mg | INTRAMUSCULAR | Status: DC | PRN

## 2024-01-21 MED ORDER — PANTOPRAZOLE SODIUM 40 MG IV SOLR
40.0000 mg | INTRAVENOUS | Status: DC
  Administered 2024-01-21: 40 mg via INTRAVENOUS
  Filled 2024-01-21: qty 10

## 2024-01-21 NOTE — Progress Notes (Signed)
 PROGRESS NOTE  Dana Roth FMW:[id number] DOB: [birth year]   PCP: Rosalea [name], PA  Patient is from: Home  DOA: 01/20/2024 LOS: 1  Chief complaints Chief Complaint  Patient presents with   Aphasia     Brief Narrative / Interim history: 63 year old F with PMH of SCLC, HTN, HLD, GERD, anxiety and depression presenting with dysphagia and difficulty speaking for 4 to 5 days.  She did not make much of it until she presented to oncology office on the day of admission.  She was sent to [facility] ED by EMS due to concern for stroke.   In ED, stable vitals except for mildly elevated BP.  CMP and CBC unrevealing.  CT head without contrast showed new multifocal brain metastasis with the largest mass in left cerebellum measuring 3.2 cm, most pronounced with vasogenic edema in the anterior left middle frontal gyrus but no intracranial mass effect or midline shift.    Patient primary oncologist recommends discharge home on Decadron  4 mg TID given patient's severe anxiety which could complicate her care.  Per primary oncology, she ultimately need whole brain radiation for her brain mets.   Unfortunately, patient has significant dysphagia and SLP recommended n.p.o. pending MBS.     Subjective: Seen and examined earlier this morning.  No major events overnight or this morning.  She feels hungry and asking if she could eat or drink.  She is eager to go home.  She is still has significant expressive aphasia.  Patient's cousin at bedside.   Objective: Vitals:   01/20/24 1649 01/20/24 2008 01/21/24 0519 01/21/24 0726  BP: (!) 158/95 (!) 159/94 (!) 150/82 136/80  Pulse: 88 94 80 90  Resp: 18 18 18 16   Temp: 98.3 F (36.8 C) 97.8 F (36.6 C) 97.6 F (36.4 C) 97.8 F (36.6 C)  TempSrc: Oral Oral Oral Oral  SpO2: 100% 96% 98% 98%  Weight:      Height:        Examination:  GENERAL: No apparent distress.  Nontoxic. HEENT: MMM.  Vision and hearing grossly intact.  NECK: Supple.  No apparent  JVD.  RESP:  No IWOB.  Fair aeration bilaterally. CVS:  RRR. Heart sounds normal.  ABD/GI/GU: BS+. Abd soft, NTND.  MSK/EXT:  Moves extremities. No apparent deformity. No edema.  SKIN: no apparent skin lesion or wound NEURO: AA.  Oriented appropriately.  Expressive aphasia.  No apparent focal neuro deficit. PSYCH: Calm. Normal affect.   Consultants:  Oncology  Procedures: None  Microbiology summarized: None  Assessment and plan: Small cell lung cancer with brain metastasis: Had dysphagia and aphasia for about 4 days when she presented to oncology office.  Noncontrasted CT head showed new multifocal brain metastasis with the largest mass in left cerebellum measuring 3.2 cm, most pronounced with vasogenic edema in the anterior left middle frontal gyrus but no intracranial mass effect or midline shift.   -Patient primary oncologist recommends discharge home on Decadron  4 mg TID given patient's severe anxiety which could complicate her care.  Per primary oncology, she ultimately need whole brain radiation for her brain mets.  -IV Protonix  40 mg daily -Keppra 500 mg twice daily - PT/OT/SLP  Dysphagia/expressive aphasia: Likely due to the above.  SLP recommended NPO. - Continue n.p.o. pending MBS - We have discussed about TF if she fails.   Essential hypertension: BP slightly elevated but improved.  Now on meds at home. - Continue monitoring  Chronic COPD: Stable - Nebulizers   Anxiety and depression:  Appears anxious.  Takes Klonopin at home. - IV Ativan  while NPO  GERD - IV PPI   Body mass index is 29.52 kg/m.          DVT prophylaxis:  SCDs Start: 01/20/24 1309  Code Status: Full code Family Communication: Updated patient's cousin at bedside Level of care: Med-Surg Status is: Inpatient Remains inpatient appropriate because: Brain metastasis, dysphagia and aphasia   Final disposition: To be determined   55 minutes with more than 50% spent in reviewing  records, counseling patient/family and coordinating care.   Sch Meds:  Scheduled Meds:  arformoterol  15 mcg Nebulization BID   budesonide  (PULMICORT ) nebulizer solution  0.5 mg Nebulization BID   dexamethasone  (DECADRON ) injection  4 mg Intravenous Q6H   levETIRAcetam  500 mg Intravenous Q12H   nicotine   14 mg Transdermal Daily   revefenacin  175 mcg Nebulization Daily   Continuous Infusions: PRN Meds:.ipratropium-albuterol , LORazepam   Antimicrobials: Anti-infectives (From admission, onward)    None        I have personally reviewed the following labs and images: CBC: Recent Labs  Lab 01/20/24 0916 01/20/24 1113  WBC 6.5 6.4  NEUTROABS 4.6 4.3  HGB 15.2* 15.0  HCT 47.0* 45.8  MCV 90.9 91.1  PLT 187 190   BMP &GFR Recent Labs  Lab 01/20/24 0916 01/20/24 1113 01/21/24 0706  NA 141 138 137  K 4.5 4.0 4.0  CL 104 104 103  CO2 25 24 25   GLUCOSE 92 94 104*  BUN 8 7* 7*  CREATININE 0.71 0.60 0.92  CALCIUM 10.0 9.6 9.6   Estimated Creatinine Clearance: 64.1 mL/min (by C-G formula based on SCr of 0.92 mg/dL). Liver & Pancreas: Recent Labs  Lab 01/20/24 0916 01/20/24 1113  AST 21 19  ALT 23 23  ALKPHOS 121 85  BILITOT 0.3 0.7  PROT 7.5 7.3  ALBUMIN 4.7 4.0   No results for input(s): LIPASE, AMYLASE in the last 168 hours. No results for input(s): AMMONIA in the last 168 hours. Diabetic: No results for input(s): HGBA1C in the last 72 hours. No results for input(s): GLUCAP in the last 168 hours. Cardiac Enzymes: No results for input(s): CKTOTAL, CKMB, CKMBINDEX, TROPONINI in the last 168 hours. No results for input(s): PROBNP in the last 8760 hours. Coagulation Profile: Recent Labs  Lab 01/20/24 1113  INR 0.9   Thyroid  Function Tests: No results for input(s): TSH, T4TOTAL, FREET4, T3FREE, THYROIDAB in the last 72 hours. Lipid Profile: No results for input(s): CHOL, HDL, LDLCALC, TRIG, CHOLHDL, LDLDIRECT in  the last 72 hours. Anemia Panel: No results for input(s): VITAMINB12, FOLATE, FERRITIN, TIBC, IRON, RETICCTPCT in the last 72 hours. Urine analysis:    Component Value Date/Time   COLORURINE YELLOW 08/11/2023 1313   APPEARANCEUR HAZY (A) 08/11/2023 1313   LABSPEC 1.010 08/11/2023 1313   PHURINE 6.5 08/11/2023 1313   GLUCOSEU NEGATIVE 08/11/2023 1313   HGBUR NEGATIVE 08/11/2023 1313   BILIRUBINUR NEGATIVE 08/11/2023 1313   KETONESUR NEGATIVE 08/11/2023 1313   PROTEINUR NEGATIVE 08/11/2023 1313   UROBILINOGEN 0.2 03/09/2007 1322   NITRITE NEGATIVE 08/11/2023 1313   LEUKOCYTESUR MODERATE (A) 08/11/2023 1313   Sepsis Labs: Invalid input(s): PROCALCITONIN, LACTICIDVEN  Microbiology: No results found for this or any previous visit (from the past 240 hours).  Radiology Studies: No results found.    Joei Frangos T. Yedidya Duddy [facility]  If 7PM-7AM, please contact night-coverage [email or web address] 01/21/2024, 1:04 PM

## 2024-01-21 NOTE — Evaluation (Signed)
 Physical Therapy Evaluation Patient Details Name: Dana Roth MRN: [id number] DOB: 22-Oct-1960 Today's Date: 01/21/2024  History of Present Illness  Dana Roth is a 63 y.o. female presented to ED 01/20/24 from her OP oncologist for concern over deficits related to word finding and swallowing. CTH shows multifocal brain metastases, new from staging MRI 07/2023 with the largest in the L cerebellum. MRI pending. PMHx: SCLD, HTN, HLD, COPD, GERD, and depression.   Clinical Impression  Pt admitted with above diagnosis. PTA, pt was independent with functional mobility, ADLs, IADLs, and driving. She lives alone in a ground floor apartment with a handicap accessible bathroom. Pt currently with functional limitations due to the deficits listed below (see PT Problem List). She required CGA for safety and stability throughout session. Pt demonstrated unsteadiness during dynamic standing balance, drifting within the hallway, and experienced 2 LOB to the left requiring CGA-minA to correct. She fatigued during short-distance gait requiring intermittent standing rest breaks and cues for PLB technique. Pt will benefit from acute skilled PT to increase her independence and safety with mobility to allow discharge. Recommend HHPT to improve activity tolerance, decrease fall risk, and optimize safety within the home environment.        If plan is discharge home, recommend the following: A little help with walking and/or transfers;A little help with bathing/dressing/bathroom;Assistance with cooking/housework;Assist for transportation   Can travel by private vehicle        Equipment Recommendations None recommended by PT  Recommendations for Other Services       Functional Status Assessment Patient has had a recent decline in their functional status and demonstrates the ability to make significant improvements in function in a reasonable and predictable amount of time.     Precautions / Restrictions  Precautions Precautions: Fall Recall of Precautions/Restrictions: Intact Restrictions Weight Bearing Restrictions Per Provider Order: No      Mobility  Bed Mobility Overal bed mobility: Needs Assistance Bed Mobility: Supine to Sit     Supine to sit: HOB elevated, Used rails, Supervision     General bed mobility comments: Pt sat up on L side of bed. No increased time or cues for sequencing.    Transfers Overall transfer level: Needs assistance Equipment used: None Transfers: Sit to/from Stand Sit to Stand: Contact guard assist           General transfer comment: Pt stood from commode and recliner chair without physical assist. CGA for safety. Good eccentric control.    Ambulation/Gait Ambulation/Gait assistance: Contact guard assist, Min assist Gait Distance (Feet): 50 Feet Assistive device: None Gait Pattern/deviations: Step-through pattern, Decreased stride length, Drifts right/left, Wide base of support Gait velocity: decreased Gait velocity interpretation: <1.31 ft/sec, indicative of household ambulator   General Gait Details: Pt ambulated with a reciprocal gait pattern. She intermittently reached out to furniture for increased support. Pt maintained a WBOS for increased stability. She was unsteady drifting throughout the hallway requiring CGA to sabilize and for safety. 2 LOB to the left requiring minA to correct. Standing rest breaks with cues for PLB.  Stairs            Wheelchair Mobility     Tilt Bed    Modified Rankin (Stroke Patients Only)       Balance Overall balance assessment: Needs assistance Sitting-balance support: No upper extremity supported, Feet supported Sitting balance-Leahy Scale: Good Sitting balance - Comments: Pt sat EOB and on recliner chair. She was able to reach outside her BOS without LOB.  Standing balance support: No upper extremity supported, During functional activity Standing balance-Leahy Scale: Fair Standing  balance comment: Pt unsteady with lateral sway and a couple of LOBs requiring CGA for stability and safety.                             Pertinent Vitals/Pain Pain Assessment Pain Assessment: No/denies pain    Home Living Family/patient expects to be discharged to:: Private residence Living Arrangements: Alone Available Help at Discharge: Family;Neighbor;Available PRN/intermittently Type of Home: Apartment Home Access: Level entry       Home Layout: One level Home Equipment: Cane - single Librarian, academic (2 wheels);Shower seat;Grab bars - tub/shower;Grab bars - toilet;Hand held shower head      Prior Function Prior Level of Function : Independent/Modified Independent;Driving             Mobility Comments: Indep no AD. Denies falls in the last 90mo. ADLs Comments: Indep. Manages own medications. Drives. Cooks/cleans.     Extremity/Trunk Assessment   Upper Extremity Assessment Upper Extremity Assessment: Defer to OT evaluation    Lower Extremity Assessment Lower Extremity Assessment: Overall WFL for tasks assessed    Cervical / Trunk Assessment Cervical / Trunk Assessment: Kyphotic  Communication   Communication Communication: Impaired Factors Affecting Communication: Difficulty expressing self;Reduced clarity of speech    Cognition Arousal: Alert Behavior During Therapy: Flat affect, Anxious   PT - Cognitive impairments: Safety/Judgement                       PT - Cognition Comments: Pt A,Ox4. She requested something for anxiety from RN during session. Pt became emotional, stated I'm dying multiple times, and began to cry. Able to console with increased time. Poor safety awareness. Pt refused to use AD depsite apparent unsteadiness and disliked the need for someone to be present to mobilize. Following commands: Intact       Cueing Cueing Techniques: Verbal cues, Gestural cues     General Comments      Exercises      Assessment/Plan    PT Assessment Patient needs continued PT services  PT Problem List Decreased activity tolerance;Decreased balance;Decreased mobility;Decreased knowledge of use of DME;Decreased safety awareness       PT Treatment Interventions DME instruction;Gait training;Functional mobility training;Therapeutic activities;Therapeutic exercise;Balance training;Patient/family education    PT Goals (Current goals can be found in the Care Plan section)  Acute Rehab PT Goals Patient Stated Goal: Return Home PT Goal Formulation: With patient/family Time For Goal Achievement: 02/04/24 Potential to Achieve Goals: Fair    Frequency Min 2X/week     Co-evaluation               AM-PAC PT 6 Clicks Mobility  Outcome Measure Help needed turning from your back to your side while in a flat bed without using bedrails?: A Little Help needed moving from lying on your back to sitting on the side of a flat bed without using bedrails?: A Little Help needed moving to and from a bed to a chair (including a wheelchair)?: A Little Help needed standing up from a chair using your arms (e.g., wheelchair or bedside chair)?: A Little Help needed to walk in hospital room?: A Little Help needed climbing 3-5 steps with a railing? : A Little 6 Click Score: 18    End of Session Equipment Utilized During Treatment: Gait belt Activity Tolerance: Patient tolerated treatment well Patient left: in  chair;with call bell/phone within reach;with chair alarm set;with family/visitor present Nurse Communication: Mobility status PT Visit Diagnosis: Unsteadiness on feet (R26.81);Other abnormalities of gait and mobility (R26.89)    Time: 1040-1100 PT Time Calculation (min) (ACUTE ONLY): 20 min   Charges:   PT Evaluation $PT Eval Moderate Complexity: 1 Mod   PT General Charges $$ ACUTE PT VISIT: 1 Visit         Randall SAUNDERS, PT, DPT Acute Rehabilitation Services Office: [phone] Secure Chat  Preferred  Delon CHRISTELLA Callander 01/21/2024, 11:35 AM

## 2024-01-21 NOTE — Progress Notes (Signed)
 [facility] CSW Progress Note  Patient's [employer] Assistance application placed in document folder at front desk for signature.   Interventions: CSW prepped documents for signature.     Follow Up Plan:  CSW will follow up with patient following hospital discharge.    Lizbeth Sprague, LCSW Clinical Social Worker [facility]

## 2024-01-21 NOTE — Progress Notes (Signed)
 Modified Barium Swallow Study  Patient Details  Name: Dana Roth MRN: [id number] Date of Birth: 01-26-1961  Today's Date: 01/21/2024  Modified Barium Swallow completed.  Full report located under Chart Review in the Imaging Section.  History of Present Illness Dana Roth is a 63 yo female presenting to ED from her OP oncologist for concern over deficits related to word finding and swallowing. CTH shows multifocal brain metastases, new from staging MRI 07/2023 with the largest in the L cerebellum. MRI pending. PMH includes SCLD, HTN, HLD, GERD, depression   Clinical Impression Pt presents with a primarily oral dysphagia, of which she demonstrates awareness and can effectively clear her R lateral sulcus with a lingual and/or digital sweep. Strength and coordination are adequate appearing, contributing to successful airway protection and complete pharyngeal clearance. No penetration or aspiration were observed throughout the study. She continues to swallow multiple times but this appears to be characterized by piecemeal swallowing, which pt appears to use as a rate control strategy. The 13 mm barium tablet was given with thin liquids and was overall WFL despite pt's complaints of globus sensation and eructation. Note, there is some perceived difficulty orally transiting the pill per the pt but she leans posteriorly and shakes her head laterally to clear it. Recommend regular solids with thin liquids. SLP will f/u at least briefly but post-acute intervention is not anticipated. Factors that may increase risk of adverse event in presence of aspiration Noe & Lianne 2021): Reduced cognitive function  Swallow Evaluation Recommendations Recommendations: PO diet PO Diet Recommendation: Regular;Thin liquids (Level 0) Liquid Administration via: Cup;Straw Medication Administration: Whole meds with puree Supervision: Patient able to self-feed;Full supervision/cueing for swallowing  strategies Swallowing strategies  : Minimize environmental distractions;Slow rate;Small bites/sips;Check for pocketing or oral holding Postural changes: Position pt fully upright for meals Oral care recommendations: Oral care BID (2x/day)    Damien Blumenthal, M.A., CCC-SLP Speech Language Pathology, Acute Rehabilitation Services  Secure Chat preferred [phone]  01/21/2024,1:42 PM

## 2024-01-21 NOTE — Evaluation (Signed)
 Occupational Therapy Evaluation Patient Details Name: Dana Roth MRN: [id number] DOB: 03-Apr-1961 Today's Date: 01/21/2024   History of Present Illness   Dana Roth is a 63 y.o. female presented to ED 01/20/24 from her OP oncologist for concern over deficits related to word finding and swallowing. CTH shows multifocal brain metastases, new from staging MRI 07/2023 with the largest in the L cerebellum. MRI pending. PMHx: SCLD, HTN, HLD, COPD, GERD, and depression.     Clinical Impressions Patient admitted for the diagnosis above.  PTA she lives alone and needed no assist for ADL,iADL or mobility.  Currently she is quite anxious about going home, presents with decreased insight into deficits and poor safety awareness.  She is setup to CGA for ADL completion, and CGA for in room mobility.  OT can continue efforts in the acute setting to address deficits, but no post acute OT is anticipated.  HH PT can determine if a HH OT need exists once home.  Family in the room will be assisting at home.       If plan is discharge home, recommend the following:   Assist for transportation;Assistance with cooking/housework;Direct supervision/assist for medications management     Functional Status Assessment   Patient has had a recent decline in their functional status and demonstrates the ability to make significant improvements in function in a reasonable and predictable amount of time.     Equipment Recommendations   None recommended by OT     Recommendations for Other Services         Precautions/Restrictions   Precautions Precautions: Fall Restrictions Weight Bearing Restrictions Per Provider Order: No     Mobility Bed Mobility Overal bed mobility: Modified Independent             General bed mobility comments: safety cues    Transfers Overall transfer level: Needs assistance   Transfers: Sit to/from Stand Sit to Stand: Contact guard assist                   Balance Overall balance assessment: Needs assistance Sitting-balance support: No upper extremity supported, Feet supported Sitting balance-Leahy Scale: Good     Standing balance support: Single extremity supported, No upper extremity supported Standing balance-Leahy Scale: Fair                             ADL either performed or assessed with clinical judgement   ADL       Grooming: Supervision/safety;Standing               Lower Body Dressing: Contact guard assist;Sit to/from stand   Toilet Transfer: Ambulance person;Ambulation                   Vision Patient Visual Report: No change from baseline       Perception Perception: Not tested       Praxis Praxis: Not tested       Pertinent Vitals/Pain Pain Assessment Pain Assessment: No/denies pain     Extremity/Trunk Assessment Upper Extremity Assessment Upper Extremity Assessment: Overall WFL for tasks assessed   Lower Extremity Assessment Lower Extremity Assessment: Defer to PT evaluation   Cervical / Trunk Assessment Cervical / Trunk Assessment: Normal   Communication Communication Communication: Impaired Factors Affecting Communication: Difficulty expressing self;Reduced clarity of speech   Cognition Arousal: Alert Behavior During Therapy: Flat affect, Anxious, Impulsive  OT - Cognition Comments: Decreased insight and safety awareness                 Following commands: Intact       Cueing  General Comments   Cueing Techniques: Verbal cues;Gestural cues   VSS on RA   Exercises     Shoulder Instructions      Home Living Family/patient expects to be discharged to:: Private residence Living Arrangements: Alone Available Help at Discharge: Family;Neighbor;Available PRN/intermittently Type of Home: Apartment Home Access: Level entry     Home Layout: One level     Bathroom Shower/Tub: Contractor: Handicapped height Bathroom Accessibility: Yes   Home Equipment: Cane - single Librarian, academic (2 wheels);Shower seat;Grab bars - tub/shower;Grab bars - toilet;Hand held shower head          Prior Functioning/Environment Prior Level of Function : Independent/Modified Independent;Driving             Mobility Comments: Indep no AD. Denies falls in the last 40mo. ADLs Comments: Indep. Manages own medications. Drives. Cooks/cleans.    OT Problem List: Impaired balance (sitting and/or standing);Decreased safety awareness   OT Treatment/Interventions: Self-care/ADL training;Therapeutic activities;Balance training      OT Goals(Current goals can be found in the care plan section)   Acute Rehab OT Goals Patient Stated Goal: Wants to go home today OT Goal Formulation: With patient Time For Goal Achievement: 02/04/24 Potential to Achieve Goals: Fair ADL Goals Pt Will Perform Grooming: with modified independence;standing Pt Will Perform Lower Body Dressing: with modified independence;sit to/from stand Pt Will Transfer to Toilet: with modified independence;regular height toilet;ambulating   OT Frequency:  Min 2X/week    Co-evaluation              AM-PAC OT 6 Clicks Daily Activity     Outcome Measure Help from another person eating meals?: None Help from another person taking care of personal grooming?: None Help from another person toileting, which includes using toliet, bedpan, or urinal?: A Little Help from another person bathing (including washing, rinsing, drying)?: A Little Help from another person to put on and taking off regular upper body clothing?: None Help from another person to put on and taking off regular lower body clothing?: A Little 6 Click Score: 21   End of Session Equipment Utilized During Treatment: Gait belt Nurse Communication: Mobility status  Activity Tolerance: Patient tolerated treatment well Patient left: in bed;with call  bell/phone within reach  OT Visit Diagnosis: Unsteadiness on feet (R26.81)                Time: 8844-8785 OT Time Calculation (min): 19 min Charges:  OT General Charges $OT Visit: 1 Visit OT Evaluation $OT Eval Moderate Complexity: 1 Mod  01/21/2024  RP, OTR/L  Acute Rehabilitation Services  Office:  [phone]   Dana Roth 01/21/2024, 12:37 PM

## 2024-01-21 NOTE — Discharge Summary (Signed)
 Physician Discharge Summary  Dana Roth FMW:[id number] DOB: February 08, 1961 DOA: 01/20/2024  PCP: Dana [name], PA  Admit date: 01/20/2024 Discharge date: 01/21/24  Admitted From: Home Disposition: Home Recommendations for Outpatient Follow-up:  Outpatient follow-up with PCP and oncology as below Check CMP and CBC in 1 week Please follow up on the following pending results: None  Home Health: [facility] PT/OT Equipment/Devices: None   Discharge Condition: Stable CODE STATUS: Full code  Follow-up Information     Dana [name], PA. Schedule an appointment as soon as possible for a visit in 1 week(s).   Specialty: Physician Assistant Contact information: [address] [place] [address] [phone]         Dana Roth. Go on 01/25/2024.   Specialty: Oncology Why: at 11:15 am for radiation oncology evaluation Contact information: [address] [place] [address] [phone]                 Hospital course 62 year old F with PMH of SCLC, HTN, HLD, GERD, anxiety and depression presenting with dysphagia and difficulty speaking for 4 to 5 days.  She did not make much of it until she presented to oncology office on the day of admission.  She was sent to [facility] ED by EMS due to concern for stroke.    In ED, stable vitals except for mildly elevated BP.  CMP and CBC unrevealing.  CT head without contrast showed new multifocal brain metastasis with the largest mass in left cerebellum measuring 3.2 cm, most pronounced with vasogenic edema in the anterior left middle frontal gyrus but no intracranial mass effect or midline shift.     Patient primary oncologist recommends discharge home on Decadron  4 mg TID given patient's severe anxiety which could complicate her care.  Per primary oncology, she ultimately need whole brain radiation for her brain mets.  She will see radiation oncology on 01/25/2024 in [place]   MBS done by SLP who recommended regular diet.   Therapy recommended home health.  See individual problem list below for more.   Problems addressed during this hospitalization Small cell lung cancer with brain metastasis: Had dysphagia and aphasia for about 4 days when she presented to oncology office.  Noncontrasted CT head showed new multifocal brain metastasis with the largest mass in left cerebellum measuring 3.2 cm, most pronounced with vasogenic edema in the anterior left middle frontal gyrus but no intracranial mass effect or midline shift.   -Patient's primary oncologist, Dr. Ezzard recommended Decadron  4 mg 3 times daily and outpatient follow-up on 01/25/2024 for radiation oncology evaluation - [facility] PT/OT ordered.   Dysphagia/expressive aphasia: Likely due to the above.  MBS done.  SLP recommended regular diet.   Essential hypertension: BP slightly elevated but improved.  Now on meds at home.  Chronic COPD: Stable - Continue home inhalers.   Anxiety and depression: Appears anxious.  Takes Klonopin at home. - Continue home Klonopin.   GERD  Body mass index is 29.52 kg/m.           Consultations: Patient's primary oncologist via secure chat  Time spent 35  minutes  Vital signs Vitals:   01/20/24 1649 01/20/24 2008 01/21/24 0519 01/21/24 0726  BP: (!) 158/95 (!) 159/94 (!) 150/82 136/80  Pulse: 88 94 80 90  Temp: 98.3 F (36.8 C) 97.8 F (36.6 C) 97.6 F (36.4 C) 97.8 F (36.6 C)  Resp: 18 18 18 16   Height:      Weight:  SpO2: 100% 96% 98% 98%  TempSrc: Oral Oral Oral Oral  BMI (Calculated):         Discharge exam  GENERAL: No apparent distress.  Nontoxic. HEENT: MMM.  Vision and hearing grossly intact.  NECK: Supple.  No apparent JVD.  RESP:  No IWOB.  Fair aeration bilaterally. CVS:  RRR. Heart sounds normal.  ABD/GI/GU: BS+. Abd soft, NTND.  MSK/EXT:  Moves extremities. No apparent deformity. No edema.  SKIN: no apparent skin lesion or wound NEURO: AA.  Oriented appropriately.  Expressive  aphasia.  No apparent focal neuro deficit. PSYCH:  anxious and treated for  Discharge Instructions Discharge Instructions     Diet general   Complete by: As directed    Discharge instructions   Complete by: As directed    It has been a pleasure taking care of you!  You were hospitalized due to difficulty swallowing and speaking likely from brain metastasis of cancer.  We have started you on Decadron  to help with the swelling . Your oncologist will arrange outpatient follow-up with radiation oncology for brain radiation.  Take your medications as prescribed.    Take care,   Increase activity slowly   Complete by: As directed       Allergies as of 01/21/2024       Reactions   Codeine         Medication List     STOP taking these medications    budesonide -formoterol  80-4.5 MCG/ACT inhaler Commonly known as: Symbicort        TAKE these medications    cetirizine 10 MG tablet Commonly known as: ZYRTEC Take 10 mg by mouth as needed for allergies.   clonazePAM 1 MG tablet Commonly known as: KLONOPIN Take 1 mg by mouth 3 (three) times daily as needed for anxiety.   dexamethasone  2 MG tablet Commonly known as: DECADRON  Take 2 tablets (4 mg total) by mouth 3 (three) times daily.   diphenhydramine -acetaminophen  25-500 MG Tabs tablet Commonly known as: TYLENOL  PM Take 1 tablet by mouth as needed.   ipratropium-albuterol  0.5-2.5 (3) MG/3ML Soln Commonly known as: DUONEB Take 3 mLs by nebulization every 6 (six) hours as needed.   nicotine  14 mg/24hr patch Commonly known as: NICODERM CQ  - dosed in mg/24 hours Place 14 mg onto the skin daily.   Trelegy Ellipta  100-62.5-25 MCG/ACT Aepb Generic drug: Fluticasone-Umeclidin-Vilant Inhale 1 puff into the lungs daily. What changed:  when to take this reasons to take this          Procedures/Studies:    DG Swallowing Func-Speech Pathology Result Date: 01/21/2024 Table formatting from the original result was  not included. Modified Barium Swallow Study Patient Details Name: Dana Roth MRN: [id number] Date of Birth: April 15, 1961 Today's Date: 01/21/2024 HPI/PMH: HPI: Dana Roth is a 63 yo female presenting to ED from her OP oncologist for concern over deficits related to word finding and swallowing. CTH shows multifocal brain metastases, new from staging MRI 07/2023 with the largest in the L cerebellum. MRI pending. PMH includes SCLD, HTN, HLD, GERD, depression Clinical Impression: Clinical Impression: Pt presents with a primarily oral dysphagia, of which she demonstrates awareness and can effectively clear her R lateral sulcus with a lingual and/or digital sweep. Strength and coordination are adequate appearing, contributing to successful airway protection and complete pharyngeal clearance. No penetration or aspiration were observed throughout the study. She continues to swallow multiple times but this appears to be characterized by piecemeal swallowing, which pt appears to use as a  rate control strategy. The 13 mm barium tablet was given with thin liquids and was overall WFL despite pt's complaints of globus sensation and eructation. Note, there is some perceived difficulty orally transiting the pill per the pt but she leans posteriorly and shakes her head laterally to clear it. Recommend regular solids with thin liquids. SLP will f/u at least briefly but post-acute intervention is not anticipated. Factors that may increase risk of adverse event in presence of aspiration Noe & Lianne 2021): Factors that may increase risk of adverse event in presence of aspiration Noe & Lianne 2021): Reduced cognitive function Recommendations/Plan: Swallowing Evaluation Recommendations Swallowing Evaluation Recommendations Recommendations: PO diet PO Diet Recommendation: Regular; Thin liquids (Level 0) Liquid Administration via: Cup; Straw Medication Administration: Whole meds with puree Supervision: Patient able to self-feed;  Full supervision/cueing for swallowing strategies Swallowing strategies  : Minimize environmental distractions; Slow rate; Small bites/sips; Check for pocketing or oral holding Postural changes: Position pt fully upright for meals Oral care recommendations: Oral care BID (2x/day) Treatment Plan Treatment Plan Treatment recommendations: Therapy as outlined in treatment plan below Follow-up recommendations: No SLP follow up Functional status assessment: Patient has had a recent decline in their functional status and demonstrates the ability to make significant improvements in function in a reasonable and predictable amount of time. Treatment frequency: Min 2x/week Treatment duration: 1 week Interventions: Aspiration precaution training; Oropharyngeal exercises; Compensatory techniques; Patient/family education Recommendations Recommendations for follow up therapy are one component of a multi-disciplinary discharge planning process, led by the attending physician.  Recommendations may be updated based on patient status, additional functional criteria and insurance authorization. Assessment: Orofacial Exam: Orofacial Exam Oral Cavity: Oral Hygiene: WFL Oral Cavity - Dentition: Dentures, top; Dentures, bottom; Other (Comment) (pt has lower dentures but does not wear them) Orofacial Anatomy: WFL Oral Motor/Sensory Function: Suspected cranial nerve impairment CN V - Trigeminal: WFL CN VII - Facial: Right motor impairment CN IX - Glossopharyngeal, CN X - Vagus: Right motor impairment CN XII - Hypoglossal: Right motor impairment Anatomy: Anatomy: Suspected cervical osteophytes Boluses Administered: Boluses Administered Boluses Administered: Thin liquids (Level 0); Mildly thick liquids (Level 2, nectar thick); Moderately thick liquids (Level 3, honey thick); Puree; Solid  Oral Impairment Domain: Oral Impairment Domain Lip Closure: No labial escape Tongue control during bolus hold: Cohesive bolus between tongue to palatal seal  Bolus preparation/mastication: Timely and efficient chewing and mashing Bolus transport/lingual motion: Brisk tongue motion Oral residue: Residue collection on oral structures Location of oral residue : Lateral sulci Initiation of pharyngeal swallow : Valleculae  Pharyngeal Impairment Domain: Pharyngeal Impairment Domain Soft palate elevation: No bolus between soft palate (SP)/pharyngeal wall (PW) Laryngeal elevation: Complete superior movement of thyroid  cartilage with complete approximation of arytenoids to epiglottic petiole Anterior hyoid excursion: Complete anterior movement Epiglottic movement: Complete inversion Laryngeal vestibule closure: Complete, no air/contrast in laryngeal vestibule Pharyngeal stripping wave : Present - complete Pharyngeal contraction (A/P view only): N/A Pharyngoesophageal segment opening: Complete distension and complete duration, no obstruction of flow Tongue base retraction: No contrast between tongue base and posterior pharyngeal wall (PPW) Pharyngeal residue: Trace residue within or on pharyngeal structures Location of pharyngeal residue: Valleculae; Pyriform sinuses  Esophageal Impairment Domain: Esophageal Impairment Domain Esophageal clearance upright position: Complete clearance, esophageal coating Pill: Pill Consistency administered: Thin liquids (Level 0) Thin liquids (Level 0): [facility] Penetration/Aspiration Scale Score: Penetration/Aspiration Scale Score 1.  Material does not enter airway: Thin liquids (Level 0); Mildly thick liquids (Level 2, nectar thick); Moderately thick liquids (Level  3, honey thick); Puree; Solid; Pill Compensatory Strategies: Compensatory Strategies Compensatory strategies: No   General Information: Caregiver present: No  Diet Prior to this Study: NPO   Temperature : Normal   Respiratory Status: WFL   Supplemental O2: None (Room air)   History of Recent Intubation: No  Behavior/Cognition: Alert; Cooperative; Pleasant mood Self-Feeding Abilities: Able to  self-feed Baseline vocal quality/speech: Normal Volitional Cough: Able to elicit Volitional Swallow: Able to elicit Exam Limitations: No limitations Goal Planning: Prognosis for improved oropharyngeal function: Good Barriers to Reach Goals: Language deficits No data recorded Patient/Family Stated Goal: wants to eat/drink Consulted and agree with results and recommendations: Patient Pain: Pain Assessment Pain Assessment: No/denies pain End of Session: Start Time:SLP Start Time (ACUTE ONLY): 1248 Stop Time: SLP Stop Time (ACUTE ONLY): 1315 Time Calculation:SLP Time Calculation (min) (ACUTE ONLY): 27 min Charges: SLP Evaluations $ SLP Speech Visit: 1 Visit SLP Evaluations $BSS Swallow: 1 Procedure $MBS Swallow: 1 Procedure SLP visit diagnosis: SLP Visit Diagnosis: Dysphagia, oropharyngeal phase (R13.12) Past Medical History: Past Medical History: Diagnosis Date  Arthritis   Complication of anesthesia   problem with B/P dropping  Depression   Dyslipidemia   GERD (gastroesophageal reflux disease)   HTN (hypertension)   only with Roth visits, caused by anxiety,med taken caused chest pain-so stopped.  Multiple osteochondroma   Multiple bone surgeries  Needle phobia   PONV (postoperative nausea and vomiting)   Rectal bleeding   Small cell lung cancer (HCC)   Suspicious mole  Past Surgical History: Past Surgical History: Procedure Laterality Date  bone tumors    Multiple excisions-all over body occ. bones lock up  BRONCHIAL BIOPSY  06/30/2023  Procedure: BRONCHIAL BIOPSIES;  Surgeon: Brenna Adine CROME, DO;  Location: [facility] ENDOSCOPY;  Service: Pulmonary;;  CHOLECYSTECTOMY    COLONOSCOPY WITH PROPOFOL  N/A 06/02/2013  Procedure: COLONOSCOPY WITH PROPOFOL ;  Surgeon: Jerrell KYM Sol, Roth;  Location: [facility] ENDOSCOPY;  Service: Endoscopy;  Laterality: N/A;  COLONOSCOPY WITH PROPOFOL  N/A 05/07/2017  Procedure: COLONOSCOPY WITH PROPOFOL ;  Surgeon: Sol Jerrell, Roth;  [facility] ENDOSCOPY;  Service: Endoscopy;  Laterality: N/A;   HEMOSTASIS CONTROL  06/30/2023  Procedure: HEMOSTASIS CONTROL;  Surgeon: Brenna Adine CROME, DO;  Location: [facility] ENDOSCOPY;  Service: Pulmonary;;  HOT HEMOSTASIS N/A 05/07/2017  Procedure: HOT HEMOSTASIS (ARGON PLASMA COAGULATION/BICAP);  Surgeon: Sol Jerrell, Roth;  Location: [facility] ENDOSCOPY;  Service: Endoscopy;  Laterality: N/A;  IR CV LINE INJECTION  11/24/2023  IR IMAGING GUIDED PORT INSERTION  11/27/2023  IR REMOVAL TUN ACCESS W/ PORT W/O FL MOD SED  11/27/2023  IR REMOVE CV FIBRIN SHEATH  11/27/2023  IR TRANSCATH RETRIEVAL FB INCL GUIDANCE (MS)  09/02/2023  IR US  GUIDE VASC ACCESS RIGHT  09/02/2023  IR VENO/JUGULAR RIGHT  09/02/2023  PORTA CATH INSERTION    VIDEO BRONCHOSCOPY N/A 06/30/2023  Procedure: VIDEO BRONCHOSCOPY;  Surgeon: Brenna Adine CROME, DO;  Location: [facility] ENDOSCOPY;  Service: Pulmonary;  Laterality: N/A; Damien Blumenthal, M.A., CCC-SLP Speech Language Pathology, Acute Rehabilitation Services Secure Chat preferred [phone] 01/21/2024, 1:45 PM  CT Head Wo Contrast Result Date: 01/20/2024 CLINICAL DATA:  63 year old female with lung cancer. Beginning 5 days ago difficulty swallowing, aphasia, blurred vision on the right side. EXAM: CT HEAD WITHOUT CONTRAST TECHNIQUE: Contiguous axial images were obtained from the base of the skull through the vertex without intravenous contrast. RADIATION DOSE REDUCTION: This exam was performed according to the departmental dose-optimization program which includes automated exposure control, adjustment of the mA and/or kV according to patient size  and/or use of iterative reconstruction technique. COMPARISON:  Brain MRI 08/06/2023 [facility] FINDINGS: Brain: New from 08/06/2023 multifocal centrally cystic and rim hyperdense brain masses including left cerebellum (3.2 cm long axis), left frontal operculum (2.8 cm long axis), anterior right middle frontal gyrus (1.3 cm long axis). Additional hyperdense masslike density anterior left middle  frontal gyrus with subjacent vasogenic edema type white matter hypodensity (series 2, image 20 and coronal image 50, roughly estimated 1.9 cm). No other conspicuous vasogenic edema. Despite the multiple intracranial masses there is no significant midline shift. Only mild mass effect on the ventricles. Basilar cisterns remain patent. No ventriculomegaly. No acute intracranial hemorrhage identified. No cortically based acute infarct identified. Vascular: No suspicious intracranial vascular hyperdensity. Skull: Intact.  No acute or suspicious osseous lesion identified. Sinuses/Orbits: Visualized paranasal sinuses and mastoids are stable and well aerated. Other: Visualized orbits and scalp soft tissues are within normal limits. IMPRESSION: 1. Positive for multifocal Brain metastases, new from staging MRI in January. Largest mass visible by CT is in the left cerebellum (3.2 cm). Most pronounced vasogenic edema is in the anterior left middle frontal gyrus. 2. No significant intracranial mass effect or midline shift at this time. Electronically Signed   By: VEAR Hurst M.D.   On: 01/20/2024 11:20       The results of significant diagnostics from this hospitalization (including imaging, microbiology, ancillary and laboratory) are listed below for reference.     Microbiology: No results found for this or any previous visit (from the past 240 hours).   Labs:  CBC: Recent Labs  Lab 01/20/24 0916 01/20/24 1113  WBC 6.5 6.4  NEUTROABS 4.6 4.3  HGB 15.2* 15.0  HCT 47.0* 45.8  MCV 90.9 91.1  PLT 187 190   BMP &GFR Recent Labs  Lab 01/20/24 0916 01/20/24 1113 01/21/24 0706  NA 141 138 137  K 4.5 4.0 4.0  CL 104 104 103  CO2 25 24 25   GLUCOSE 92 94 104*  BUN 8 7* 7*  CREATININE 0.71 0.60 0.92  CALCIUM 10.0 9.6 9.6   Estimated Creatinine Clearance: 64.1 mL/min (by C-G formula based on SCr of 0.92 mg/dL). Liver & Pancreas: Recent Labs  Lab 01/20/24 0916 01/20/24 1113  AST 21 19  ALT 23 23   ALKPHOS 121 85  BILITOT 0.3 0.7  PROT 7.5 7.3  ALBUMIN 4.7 4.0   No results for input(s): LIPASE, AMYLASE in the last 168 hours. No results for input(s): AMMONIA in the last 168 hours. Diabetic: No results for input(s): HGBA1C in the last 72 hours. No results for input(s): GLUCAP in the last 168 hours. Cardiac Enzymes: No results for input(s): CKTOTAL, CKMB, CKMBINDEX, TROPONINI in the last 168 hours. No results for input(s): PROBNP in the last 8760 hours. Coagulation Profile: Recent Labs  Lab 01/20/24 1113  INR 0.9   Thyroid  Function Tests: No results for input(s): TSH, T4TOTAL, FREET4, T3FREE, THYROIDAB in the last 72 hours. Lipid Profile: No results for input(s): CHOL, HDL, LDLCALC, TRIG, CHOLHDL, LDLDIRECT in the last 72 hours. Anemia Panel: No results for input(s): VITAMINB12, FOLATE, FERRITIN, TIBC, IRON, RETICCTPCT in the last 72 hours. Urine analysis:    Component Value Date/Time   COLORURINE YELLOW 08/11/2023 1313   APPEARANCEUR HAZY (A) 08/11/2023 1313   LABSPEC 1.010 08/11/2023 1313   PHURINE 6.5 08/11/2023 1313   GLUCOSEU NEGATIVE 08/11/2023 1313   HGBUR NEGATIVE 08/11/2023 1313   BILIRUBINUR NEGATIVE 08/11/2023 1313   KETONESUR  NEGATIVE 08/11/2023 1313   PROTEINUR NEGATIVE 08/11/2023 1313   UROBILINOGEN 0.2 03/09/2007 1322   NITRITE NEGATIVE 08/11/2023 1313   LEUKOCYTESUR MODERATE (A) 08/11/2023 1313   Sepsis Labs: Invalid input(s): PROCALCITONIN, LACTICIDVEN   SIGNED:  Kamorah Nevils T Iliani Vejar, Roth  [facility] 01/21/2024, 2:50 PM

## 2024-01-21 NOTE — Care Management (Signed)
 Transition of Care [facility]) - Inpatient Brief Assessment   Patient Details  Name: Dana Roth MRN: [id number] Date of Birth: 04-22-1961  [facility] CM/SW Contact:    Corean JAYSON Canary, RN Phone Number: 01/21/2024, 2:07 PM   Clinical Narrative: Patient will be DC home, HH was recommended. Patient has [facility] [address], Reached out to several  agencies for [employer],  ( [place], [place], [place]) none take  the patients insurance. Follow up with her PCP if she desires OP services., Food insecurity, placed resources on AVS DC home   Transition of Care Asessment: Insurance and Status: Insurance coverage has been reviewed Patient has primary care physician: Yes Home environment has been reviewed: Lives alone has pets Prior level of function:: Independent   Social Drivers of Health Review: SDOH reviewed interventions complete Readmission risk has been reviewed: Yes Transition of care needs: transition of care needs identified, TOC will continue to follow

## 2024-01-25 ENCOUNTER — Telehealth: Payer: Self-pay

## 2024-01-25 ENCOUNTER — Ambulatory Visit
Admission: RE | Admit: 2024-01-25 | Discharge: 2024-01-25 | Disposition: A | Discharge status: Home / Self Care | Source: Ambulatory Visit | Attending: Radiation Oncology | Admitting: Radiation Oncology

## 2024-01-25 VITALS — BP 132/87 | HR 108 | Temp 98.2°F | Resp 18 | Ht 64.0 in | Wt 174.4 lb

## 2024-01-25 DIAGNOSIS — C3492 Malignant neoplasm of unspecified part of left bronchus or lung: Secondary | ICD-10-CM | POA: Diagnosis not present

## 2024-01-25 DIAGNOSIS — Z87891 Personal history of nicotine dependence: Secondary | ICD-10-CM | POA: Diagnosis not present

## 2024-01-25 DIAGNOSIS — Z510 Encounter for antineoplastic radiation therapy: Secondary | ICD-10-CM | POA: Insufficient documentation

## 2024-01-25 DIAGNOSIS — C7931 Secondary malignant neoplasm of brain: Secondary | ICD-10-CM | POA: Diagnosis present

## 2024-01-25 MED ORDER — DEXAMETHASONE 4 MG PO TABS: 4.0000 mg | ORAL_TABLET | Freq: Three times a day (TID) | ORAL | 0 refills | Status: DC

## 2024-01-25 NOTE — Telephone Encounter (Signed)
 Patient was discharged from the hospital with Dexamethasone  2 mg  tablets. Patient's pharmacy did not have enough to fill script. She did not start medication. Katrina D, RN called and spoke pharmacy, and they advised that they did have 4 mg tablets available. New script for Dexamethasone  4 mg three times a day has been sent to her pharmacy.

## 2024-01-26 ENCOUNTER — Other Ambulatory Visit: Payer: Self-pay

## 2024-01-26 DIAGNOSIS — Z510 Encounter for antineoplastic radiation therapy: Secondary | ICD-10-CM | POA: Diagnosis not present

## 2024-01-27 ENCOUNTER — Ambulatory Visit
Admission: RE | Admit: 2024-01-27 | Discharge: 2024-01-27 | Disposition: A | Discharge status: Home / Self Care | Source: Ambulatory Visit | Attending: Radiation Oncology | Admitting: Radiation Oncology

## 2024-01-27 ENCOUNTER — Other Ambulatory Visit: Payer: Self-pay

## 2024-01-27 DIAGNOSIS — Z510 Encounter for antineoplastic radiation therapy: Secondary | ICD-10-CM | POA: Diagnosis not present

## 2024-01-27 LAB — RAD ONC ARIA SESSION SUMMARY
Course Elapsed Days: 0
Plan Fractions Treated to Date: 1
Plan Prescribed Dose Per Fraction: 2.5 Gy
Plan Total Fractions Prescribed: 12
Plan Total Prescribed Dose: 30 Gy
Reference Point Dosage Given to Date: 2.5 Gy
Reference Point Session Dosage Given: 2.5 Gy
Session Number: 1

## 2024-01-28 ENCOUNTER — Other Ambulatory Visit: Payer: Self-pay

## 2024-01-28 ENCOUNTER — Ambulatory Visit
Admission: RE | Admit: 2024-01-28 | Discharge: 2024-01-28 | Disposition: A | Discharge status: Home / Self Care | Source: Ambulatory Visit | Attending: Radiation Oncology | Admitting: Radiation Oncology

## 2024-01-28 DIAGNOSIS — Z510 Encounter for antineoplastic radiation therapy: Secondary | ICD-10-CM | POA: Diagnosis not present

## 2024-01-28 LAB — RAD ONC ARIA SESSION SUMMARY
Course Elapsed Days: 1
Plan Fractions Treated to Date: 2
Plan Prescribed Dose Per Fraction: 2.5 Gy
Plan Total Fractions Prescribed: 12
Plan Total Prescribed Dose: 30 Gy
Reference Point Dosage Given to Date: 5 Gy
Reference Point Session Dosage Given: 2.5 Gy
Session Number: 2

## 2024-01-28 NOTE — Progress Notes (Signed)
 Radiation Oncology         [phone] ________________________________  Name: Dana Roth        MRN: [id number]  Date of Service: 01/25/2024 DOB: 1961/03/06  RR:Cjwdunmb, [name], PA       REFERRING PHYSICIAN: Ezzard Peters, MD   DIAGNOSIS: Metastatic small cell lung carcinoma   HISTORY OF PRESENT ILLNESS: Dana Roth is a 63 y.o. female seen at the request of Dr. Ezzard.  Earlier this year, she was diagnosed with metastatic small cell lung carcinoma, which included spread of disease to her liver.  She initially received systemic chemotherapy with carboplatin , etoposide , and atezolizumab ; more recently, she has been receiving treatment with maintenance atezolizumab .  Recently, she was noted to have dizziness and confusion; she and her family members initially thought that this perhaps represented a stroke.  Ultimately, CT imaging of the head was obtained, revealing multiple brain masses consistent with metastatic disease.  The largest mass was in the left cerebellum, and measured 3.2 cm.  Dexamethasone  was prescribed, but her pharmacy did not have it in stock, and she has not yet started it.  Consultation is requested regarding the potential role of radiation in her care.    PREVIOUS RADIATION THERAPY: No   PAST MEDICAL HISTORY:  Past Medical History:  Diagnosis Date   Arthritis    Complication of anesthesia    problem with B/P dropping   Depression    Dyslipidemia    GERD (gastroesophageal reflux disease)    HTN (hypertension)    only with MD visits, caused by anxiety,med taken caused chest pain-so stopped.   Multiple osteochondroma    Multiple bone surgeries   Needle phobia    PONV (postoperative nausea and vomiting)    Rectal bleeding    Small cell lung cancer (HCC)    Suspicious mole        PAST SURGICAL HISTORY: Past Surgical History:  Procedure Laterality Date   bone tumors     Multiple excisions-all over body occ. bones lock up   BRONCHIAL  BIOPSY  06/30/2023   Procedure: BRONCHIAL BIOPSIES;  Surgeon: Brenna Adine CROME, DO;  Location: [facility];  Service: Pulmonary;;   CHOLECYSTECTOMY     COLONOSCOPY WITH PROPOFOL  N/A 06/02/2013   Procedure: COLONOSCOPY WITH PROPOFOL ;  Surgeon: Jerrell KYM Sol, MD;  Location: [facility];  Service: Endoscopy;  Laterality: N/A;   COLONOSCOPY WITH PROPOFOL  N/A 05/07/2017   Procedure: COLONOSCOPY WITH PROPOFOL ;  Surgeon: Sol Jerrell, MD;  [facility] ENDOSCOPY;  Service: Endoscopy;  Laterality: N/A;   HEMOSTASIS CONTROL  06/30/2023   Procedure: HEMOSTASIS CONTROL;  Surgeon: Brenna Adine CROME, DO;  Location: [facility] ENDOSCOPY;  Service: Pulmonary;;   HOT HEMOSTASIS N/A 05/07/2017   Procedure: HOT HEMOSTASIS (ARGON PLASMA COAGULATION/BICAP);  Surgeon: Sol Jerrell, MD;  Location: [facility] ENDOSCOPY;  Service: Endoscopy;  Laterality: N/A;   IR CV LINE INJECTION  11/24/2023   IR IMAGING GUIDED PORT INSERTION  11/27/2023   IR REMOVAL TUN ACCESS W/ PORT W/O FL MOD SED  11/27/2023   IR REMOVE CV FIBRIN SHEATH  11/27/2023   IR TRANSCATH RETRIEVAL FB INCL GUIDANCE (MS)  09/02/2023   IR US  GUIDE VASC ACCESS RIGHT  09/02/2023   IR VENO/JUGULAR RIGHT  09/02/2023   PORTA CATH INSERTION     VIDEO BRONCHOSCOPY N/A 06/30/2023   Procedure: VIDEO BRONCHOSCOPY;  Surgeon: Brenna Adine CROME, DO;  Location: [facility] ENDOSCOPY;  Service: Pulmonary;  Laterality: N/A;     FAMILY HISTORY:  Family History  Problem Relation Age of Onset   Heart disease Brother        Hole in heart   CAD Sister      SOCIAL HISTORY:  reports that she has been smoking cigarettes. She has a 40 pack-year smoking history. She has never used smokeless tobacco. She reports current drug use. Frequency: 7.00 times per week. Drug: Marijuana. She reports that she does not drink alcohol.   ALLERGIES: Codeine   MEDICATIONS:  Current Outpatient Medications  Medication Sig Dispense Refill   cetirizine (ZYRTEC) 10 MG tablet Take 10 mg by mouth as  needed for allergies.     clonazePAM (KLONOPIN) 1 MG tablet Take 1 mg by mouth 3 (three) times daily as needed for anxiety.     dexamethasone  (DECADRON ) 4 MG tablet Take 1 tablet (4 mg total) by mouth 3 (three) times daily. 90 tablet 0   diphenhydramine -acetaminophen  (TYLENOL  PM) 25-500 MG TABS tablet Take 1 tablet by mouth as needed.     Fluticasone-Umeclidin-Vilant (TRELEGY ELLIPTA ) 100-62.5-25 MCG/ACT AEPB Inhale 1 puff into the lungs daily. (Patient taking differently: Inhale 1 puff into the lungs as needed.)     ipratropium-albuterol  (DUONEB) 0.5-2.5 (3) MG/3ML SOLN Take 3 mLs by nebulization every 6 (six) hours as needed. 360 mL 3   nicotine  (NICODERM CQ  - DOSED IN MG/24 HOURS) 14 mg/24hr patch Place 14 mg onto the skin daily.     No current facility-administered medications for this encounter.     REVIEW OF SYSTEMS: A 12 point review of systems was performed, and reviewed by me with the patient.  She notes moderate dizziness, especially when standing.  She reports occasional nausea, without vomiting.  She denies headaches.  She remains confused at times per her family.  She reports no vision changes.  She denies any chest pain, shortness of breath, cough, fevers, chills, night sweats, unintended weight changes.  She denies any new musculoskeletal or joint aches or pains. A complete review of systems is obtained and is otherwise negative.     PHYSICAL EXAM:  Wt Readings from Last 3 Encounters:  01/25/24 174 lb 6.4 oz (79.1 kg)  01/20/24 171 lb 15.3 oz (78 kg)  12/30/23 174 lb (78.9 kg)   Temp Readings from Last 3 Encounters:  01/25/24 98.2 F (36.8 C) (Oral)  01/21/24 97.8 F (36.6 C) (Oral)  12/30/23 98.1 F (36.7 C) (Oral)   BP Readings from Last 3 Encounters:  01/25/24 132/87  01/21/24 136/80  01/20/24 (!) 175/113   Pulse Readings from Last 3 Encounters:  01/25/24 (!) 108  01/21/24 90  01/20/24 (!) 106   Pain Assessment Pain Score: 3  Pain Loc: Head/10  She is in  no acute distress.  She's alert and oriented x4 and appropriate throughout the examination, though she struggles with word finding at times. Cardiopulmonary assessment is negative for acute distress and she exhibits normal effort.  No cervical or supraclavicular lymphadenopathy is appreciated.  Her oropharynx is clear.    ECOG = 2  0 - Asymptomatic (Fully active, able to carry on all predisease activities without restriction)  1 - Symptomatic but completely ambulatory (Restricted in physically strenuous activity but ambulatory and able to carry out work of a light or sedentary nature. For example, light housework, office work)  2 - Symptomatic, <50% in bed during the day (Ambulatory and capable of all self care but unable to carry out any work activities. Up and about more than 50% of waking hours)  3 - Symptomatic, >  50% in bed, but not bedbound (Capable of only limited self-care, confined to bed or chair 50% or more of waking hours)  4 - Bedbound (Completely disabled. Cannot carry on any self-care. Totally confined to bed or chair)  5 - Death   Dana Roth MM, Creech RH, Tormey DC, et al. [phone]). Toxicity and response criteria of the [facility]. Am. DOROTHA Bridges. Oncol. 5 (6): 649-55    LABORATORY DATA:  Lab Results  Component Value Date   WBC 6.4 01/20/2024   HGB 15.0 01/20/2024   HCT 45.8 01/20/2024   MCV 91.1 01/20/2024   PLT 190 01/20/2024   Lab Results  Component Value Date   NA 137 01/21/2024   K 4.0 01/21/2024   CL 103 01/21/2024   CO2 25 01/21/2024   Lab Results  Component Value Date   ALT 23 01/20/2024   AST 19 01/20/2024   ALKPHOS 85 01/20/2024   BILITOT 0.7 01/20/2024      RADIOGRAPHY: DG Swallowing Func-Speech Pathology Result Date: 01/21/2024 Table formatting from the original result was not included. Modified Barium Swallow Study Patient Details Name: Dana Roth MRN: [id number] Date of Birth: Sep 17, 1960 Today's Date: 01/21/2024 HPI/PMH: HPI:  Dana Roth is a 63 yo female presenting to ED from her OP oncologist for concern over deficits related to word finding and swallowing. CTH shows multifocal brain metastases, new from staging MRI 07/2023 with the largest in the L cerebellum. MRI pending. PMH includes SCLD, HTN, HLD, GERD, depression Clinical Impression: Clinical Impression: Pt presents with a primarily oral dysphagia, of which she demonstrates awareness and can effectively clear her R lateral sulcus with a lingual and/or digital sweep. Strength and coordination are adequate appearing, contributing to successful airway protection and complete pharyngeal clearance. No penetration or aspiration were observed throughout the study. She continues to swallow multiple times but this appears to be characterized by piecemeal swallowing, which pt appears to use as a rate control strategy. The 13 mm barium tablet was given with thin liquids and was overall WFL despite pt's complaints of globus sensation and eructation. Note, there is some perceived difficulty orally transiting the pill per the pt but she leans posteriorly and shakes her head laterally to clear it. Recommend regular solids with thin liquids. SLP will f/u at least briefly but post-acute intervention is not anticipated. Factors that may increase risk of adverse event in presence of aspiration Noe & Lianne 2021): Factors that may increase risk of adverse event in presence of aspiration Noe & Lianne 2021): Reduced cognitive function Recommendations/Plan: Swallowing Evaluation Recommendations Swallowing Evaluation Recommendations Recommendations: PO diet PO Diet Recommendation: Regular; Thin liquids (Level 0) Liquid Administration via: Cup; Straw Medication Administration: Whole meds with puree Supervision: Patient able to self-feed; Full supervision/cueing for swallowing strategies Swallowing strategies  : Minimize environmental distractions; Slow rate; Small bites/sips; Check for  pocketing or oral holding Postural changes: Position pt fully upright for meals Oral care recommendations: Oral care BID (2x/day) Treatment Plan Treatment Plan Treatment recommendations: Therapy as outlined in treatment plan below Follow-up recommendations: No SLP follow up Functional status assessment: Patient has had a recent decline in their functional status and demonstrates the ability to make significant improvements in function in a reasonable and predictable amount of time. Treatment frequency: Min 2x/week Treatment duration: 1 week Interventions: Aspiration precaution training; Oropharyngeal exercises; Compensatory techniques; Patient/family education Recommendations Recommendations for follow up therapy are one component of a multi-disciplinary discharge planning process, led by the attending physician.  Recommendations may be  updated based on patient status, additional functional criteria and insurance authorization. Assessment: Orofacial Exam: Orofacial Exam Oral Cavity: Oral Hygiene: WFL Oral Cavity - Dentition: Dentures, top; Dentures, bottom; Other (Comment) (pt has lower dentures but does not wear them) Orofacial Anatomy: WFL Oral Motor/Sensory Function: Suspected cranial nerve impairment CN V - Trigeminal: WFL CN VII - Facial: Right motor impairment CN IX - Glossopharyngeal, CN X - Vagus: Right motor impairment CN XII - Hypoglossal: Right motor impairment Anatomy: Anatomy: Suspected cervical osteophytes Boluses Administered: Boluses Administered Boluses Administered: Thin liquids (Level 0); Mildly thick liquids (Level 2, nectar thick); Moderately thick liquids (Level 3, honey thick); Puree; Solid  Oral Impairment Domain: Oral Impairment Domain Lip Closure: No labial escape Tongue control during bolus hold: Cohesive bolus between tongue to palatal seal Bolus preparation/mastication: Timely and efficient chewing and mashing Bolus transport/lingual motion: Brisk tongue motion Oral residue: Residue  collection on oral structures Location of oral residue : Lateral sulci Initiation of pharyngeal swallow : Valleculae  Pharyngeal Impairment Domain: Pharyngeal Impairment Domain Soft palate elevation: No bolus between soft palate (SP)/pharyngeal wall (PW) Laryngeal elevation: Complete superior movement of thyroid  cartilage with complete approximation of arytenoids to epiglottic petiole Anterior hyoid excursion: Complete anterior movement Epiglottic movement: Complete inversion Laryngeal vestibule closure: Complete, no air/contrast in laryngeal vestibule Pharyngeal stripping wave : Present - complete Pharyngeal contraction (A/P view only): N/A Pharyngoesophageal segment opening: Complete distension and complete duration, no obstruction of flow Tongue base retraction: No contrast between tongue base and posterior pharyngeal wall (PPW) Pharyngeal residue: Trace residue within or on pharyngeal structures Location of pharyngeal residue: Valleculae; Pyriform sinuses  Esophageal Impairment Domain: Esophageal Impairment Domain Esophageal clearance upright position: Complete clearance, esophageal coating Pill: Pill Consistency administered: Thin liquids (Level 0) Thin liquids (Level 0): [facility] Penetration/Aspiration Scale Score: Penetration/Aspiration Scale Score 1.  Material does not enter airway: Thin liquids (Level 0); Mildly thick liquids (Level 2, nectar thick); Moderately thick liquids (Level 3, honey thick); Puree; Solid; Pill Compensatory Strategies: Compensatory Strategies Compensatory strategies: No   General Information: Caregiver present: No  Diet Prior to this Study: NPO   Temperature : Normal   Respiratory Status: WFL   Supplemental O2: None (Room air)   History of Recent Intubation: No  Behavior/Cognition: Alert; Cooperative; Pleasant mood Self-Feeding Abilities: Able to self-feed Baseline vocal quality/speech: Normal Volitional Cough: Able to elicit Volitional Swallow: Able to elicit Exam Limitations: No  limitations Goal Planning: Prognosis for improved oropharyngeal function: Good Barriers to Reach Goals: Language deficits No data recorded Patient/Family Stated Goal: wants to eat/drink Consulted and agree with results and recommendations: Patient Pain: Pain Assessment Pain Assessment: No/denies pain End of Session: Start Time:SLP Start Time (ACUTE ONLY): 1248 Stop Time: SLP Stop Time (ACUTE ONLY): 1315 Time Calculation:SLP Time Calculation (min) (ACUTE ONLY): 27 min Charges: SLP Evaluations $ SLP Speech Visit: 1 Visit SLP Evaluations $BSS Swallow: 1 Procedure $MBS Swallow: 1 Procedure SLP visit diagnosis: SLP Visit Diagnosis: Dysphagia, oropharyngeal phase (R13.12) Past Medical History: Past Medical History: Diagnosis Date  Arthritis   Complication of anesthesia   problem with B/P dropping  Depression   Dyslipidemia   GERD (gastroesophageal reflux disease)   HTN (hypertension)   only with MD visits, caused by anxiety,med taken caused chest pain-so stopped.  Multiple osteochondroma   Multiple bone surgeries  Needle phobia   PONV (postoperative nausea and vomiting)   Rectal bleeding   Small cell lung cancer (HCC)   Suspicious mole  Past Surgical History: Past Surgical History: Procedure Laterality  Date  bone tumors    Multiple excisions-all over body occ. bones lock up  BRONCHIAL BIOPSY  06/30/2023  Procedure: BRONCHIAL BIOPSIES;  Surgeon: Brenna Adine CROME, DO;  Location: [facility] ENDOSCOPY;  Service: Pulmonary;;  CHOLECYSTECTOMY    COLONOSCOPY WITH PROPOFOL  N/A 06/02/2013  Procedure: COLONOSCOPY WITH PROPOFOL ;  Surgeon: Jerrell KYM Sol, MD;  Location: [facility] ENDOSCOPY;  Service: Endoscopy;  Laterality: N/A;  COLONOSCOPY WITH PROPOFOL  N/A 05/07/2017  Procedure: COLONOSCOPY WITH PROPOFOL ;  Surgeon: Sol Jerrell, MD;  Location: [facility] ENDOSCOPY;  Service: Endoscopy;  Laterality: N/A;  HEMOSTASIS CONTROL  06/30/2023  Procedure: HEMOSTASIS CONTROL;  Surgeon: Brenna Adine CROME, DO;  Location: [facility] ENDOSCOPY;  Service:  Pulmonary;;  HOT HEMOSTASIS N/A 05/07/2017  Procedure: HOT HEMOSTASIS (ARGON PLASMA COAGULATION/BICAP);  Surgeon: Sol Jerrell, MD;  Location: [facility] ENDOSCOPY;  Service: Endoscopy;  Laterality: N/A;  IR CV LINE INJECTION  11/24/2023  IR IMAGING GUIDED PORT INSERTION  11/27/2023  IR REMOVAL TUN ACCESS W/ PORT W/O FL MOD SED  11/27/2023  IR REMOVE CV FIBRIN SHEATH  11/27/2023  IR TRANSCATH RETRIEVAL FB INCL GUIDANCE (MS)  09/02/2023  IR US  GUIDE VASC ACCESS RIGHT  09/02/2023  IR VENO/JUGULAR RIGHT  09/02/2023  PORTA CATH INSERTION    VIDEO BRONCHOSCOPY N/A 06/30/2023  Procedure: VIDEO BRONCHOSCOPY;  Surgeon: Brenna Adine CROME, DO;  Location: [facility] ENDOSCOPY;  Service: Pulmonary;  Laterality: N/A; Damien Blumenthal, M.A., CCC-SLP Speech Language Pathology, Acute Rehabilitation Services Secure Chat preferred [phone] 01/21/2024, 1:45 PM  CT Head Wo Contrast Result Date: 01/20/2024 CLINICAL DATA:  63 year old female with lung cancer. Beginning 5 days ago difficulty swallowing, aphasia, blurred vision on the right side. EXAM: CT HEAD WITHOUT CONTRAST TECHNIQUE: Contiguous axial images were obtained from the base of the skull through the vertex without intravenous contrast. RADIATION DOSE REDUCTION: This exam was performed according to the departmental dose-optimization program which includes automated exposure control, adjustment of the mA and/or kV according to patient size and/or use of iterative reconstruction technique. COMPARISON:  Brain MRI 08/06/2023 [facility] FINDINGS: Brain: New from 08/06/2023 multifocal centrally cystic and rim hyperdense brain masses including left cerebellum (3.2 cm long axis), left frontal operculum (2.8 cm long axis), anterior right middle frontal gyrus (1.3 cm long axis). Additional hyperdense masslike density anterior left middle frontal gyrus with subjacent vasogenic edema type white matter hypodensity (series 2, image 20 and coronal image 50, roughly  estimated 1.9 cm). No other conspicuous vasogenic edema. Despite the multiple intracranial masses there is no significant midline shift. Only mild mass effect on the ventricles. Basilar cisterns remain patent. No ventriculomegaly. No acute intracranial hemorrhage identified. No cortically based acute infarct identified. Vascular: No suspicious intracranial vascular hyperdensity. Skull: Intact.  No acute or suspicious osseous lesion identified. Sinuses/Orbits: Visualized paranasal sinuses and mastoids are stable and well aerated. Other: Visualized orbits and scalp soft tissues are within normal limits. IMPRESSION: 1. Positive for multifocal Brain metastases, new from staging MRI in January. Largest mass visible by CT is in the left cerebellum (3.2 cm). Most pronounced vasogenic edema is in the anterior left middle frontal gyrus. 2. No significant intracranial mass effect or midline shift at this time. Electronically Signed   By: VEAR Hurst M.D.   On: 01/20/2024 11:20       IMPRESSION/PLAN: 1.  The patient is a 63 year old female with metastatic small cell lung carcinoma, now with at least 5 metastatic deposits in her brain.  I have recommended to the patient and her family  members that we proceed with palliative whole brain radiation; I reviewed the rationale behind this recommendation, as well as the potential side effects associated with whole brain radiation in her clinical scenario.  I explained these may include, but are not limited to, fatigue, hair loss, scalp irritation, and decreased appetite.  I explained that potential long-term side effects of radiation may include, but are not limited to, neurocognitive changes, and risk of permanent injury to cranial nerves and/or the pituitary gland.  She expressed understanding, and is agreeable to proceed.  Arrangements will be made for her to undergo simulation and treatment planning.   In a visit lasting 45 minutes, greater than 50% of the time was spent face  to face discussing the patient's condition, in preparation for the discussion, and coordinating the patient's care.    Yerik Zeringue A. Jomarie, MD   **Disclaimer: This note was dictated with voice recognition software. Similar sounding words can inadvertently be transcribed and this note may contain transcription errors which may not have been corrected upon publication of note.**

## 2024-01-29 ENCOUNTER — Encounter: Payer: Self-pay | Admitting: Oncology

## 2024-01-29 ENCOUNTER — Ambulatory Visit
Admission: RE | Admit: 2024-01-29 | Discharge: 2024-01-29 | Disposition: A | Discharge status: Home / Self Care | Source: Ambulatory Visit | Attending: Radiation Oncology | Admitting: Radiation Oncology

## 2024-01-29 ENCOUNTER — Other Ambulatory Visit: Payer: Self-pay

## 2024-01-29 ENCOUNTER — Other Ambulatory Visit: Payer: Self-pay | Admitting: Pharmacist

## 2024-01-29 DIAGNOSIS — Z510 Encounter for antineoplastic radiation therapy: Secondary | ICD-10-CM | POA: Diagnosis not present

## 2024-01-29 DIAGNOSIS — C3412 Malignant neoplasm of upper lobe, left bronchus or lung: Secondary | ICD-10-CM

## 2024-01-29 LAB — RAD ONC ARIA SESSION SUMMARY
Course Elapsed Days: 2
Plan Fractions Treated to Date: 3
Plan Prescribed Dose Per Fraction: 2.5 Gy
Plan Total Fractions Prescribed: 12
Plan Total Prescribed Dose: 30 Gy
Reference Point Dosage Given to Date: 7.5 Gy
Reference Point Session Dosage Given: 2.5 Gy
Session Number: 3

## 2024-02-01 ENCOUNTER — Other Ambulatory Visit: Payer: Self-pay

## 2024-02-01 ENCOUNTER — Ambulatory Visit
Admission: RE | Admit: 2024-02-01 | Discharge: 2024-02-01 | Disposition: A | Discharge status: Home / Self Care | Source: Ambulatory Visit | Attending: Radiation Oncology | Admitting: Radiation Oncology

## 2024-02-01 DIAGNOSIS — Z510 Encounter for antineoplastic radiation therapy: Secondary | ICD-10-CM | POA: Diagnosis not present

## 2024-02-01 LAB — RAD ONC ARIA SESSION SUMMARY
Course Elapsed Days: 5
Plan Fractions Treated to Date: 4
Plan Prescribed Dose Per Fraction: 2.5 Gy
Plan Total Fractions Prescribed: 12
Plan Total Prescribed Dose: 30 Gy
Reference Point Dosage Given to Date: 10 Gy
Reference Point Session Dosage Given: 2.5 Gy
Session Number: 4

## 2024-02-02 ENCOUNTER — Other Ambulatory Visit: Payer: Self-pay

## 2024-02-02 ENCOUNTER — Ambulatory Visit
Admission: RE | Admit: 2024-02-02 | Discharge: 2024-02-02 | Disposition: A | Discharge status: Home / Self Care | Source: Ambulatory Visit | Attending: Radiation Oncology | Admitting: Radiation Oncology

## 2024-02-02 DIAGNOSIS — Z510 Encounter for antineoplastic radiation therapy: Secondary | ICD-10-CM | POA: Diagnosis not present

## 2024-02-02 LAB — RAD ONC ARIA SESSION SUMMARY
Course Elapsed Days: 6
Plan Fractions Treated to Date: 5
Plan Prescribed Dose Per Fraction: 2.5 Gy
Plan Total Fractions Prescribed: 12
Plan Total Prescribed Dose: 30 Gy
Reference Point Dosage Given to Date: 12.5 Gy
Reference Point Session Dosage Given: 2.5 Gy
Session Number: 5

## 2024-02-02 MED ORDER — DEXAMETHASONE 4 MG PO TABS: 10.0000 mg | ORAL_TABLET | Freq: Every day | ORAL | 0 refills | Status: DC

## 2024-02-03 ENCOUNTER — Ambulatory Visit: Payer: Self-pay

## 2024-02-03 ENCOUNTER — Other Ambulatory Visit: Payer: Self-pay

## 2024-02-03 ENCOUNTER — Ambulatory Visit
Admission: RE | Admit: 2024-02-03 | Discharge: 2024-02-03 | Disposition: A | Discharge status: Home / Self Care | Source: Ambulatory Visit | Attending: Radiation Oncology | Admitting: Radiation Oncology

## 2024-02-03 DIAGNOSIS — Z510 Encounter for antineoplastic radiation therapy: Secondary | ICD-10-CM | POA: Diagnosis not present

## 2024-02-03 LAB — RAD ONC ARIA SESSION SUMMARY
Course Elapsed Days: 7
Plan Fractions Treated to Date: 6
Plan Prescribed Dose Per Fraction: 2.5 Gy
Plan Total Fractions Prescribed: 12
Plan Total Prescribed Dose: 30 Gy
Reference Point Dosage Given to Date: 15 Gy
Reference Point Session Dosage Given: 2.5 Gy
Session Number: 6

## 2024-02-03 NOTE — Telephone Encounter (Addendum)
 FYI Only or Action Required?: Action required by provider: medication refill request.  Patient was last seen in Pulmonary on 08/2023  Called Nurse Triage reporting pcp call .  Symptoms began today.  Interventions attempted: Prescription medications: (TRELEGY ELLIPTA ) 100-62.5-25 MCG/ACT AEPB samples. .  Symptoms are: stable.  Triage Disposition: Call PCP Now  Patient/caregiver understands and will follow disposition?: No, wishes to speak with PCP  **Please see note below** She reports that Lauraine Lites writes refills for her**                       Copied from CRM [id number]. Topic: Clinical - Red Word Triage >> Feb 03, 2024  4:43 PM Celestine FALCON wrote: Red Word that prompted transfer to Nurse Triage: Pt has chemo as her cancer has spread to her brain, but she is in need of more Fluticasone-Umeclidin-Vilant (TRELEGY ELLIPTA ) 100-62.5-25 MCG/ACT AEPB samples. I've already sent over a crm to the clinical pool. Pt is experiencing SOB currently when speaking to the pt. Reason for Disposition  [1] Caller requests to speak ONLY to PCP AND [2] URGENT question  Answer Assessment - Initial Assessment Questions 1. REASON FOR CALL or QUESTION: What is your reason for calling today? or How can I best   Patient would like the (TRELEGY ELLIPTA ) 100-62.5-25 MCG/ACT AEPB samples, as she has three puffs left. She is stating her insurance doesn't cover the medication. She is having SOB.  Protocols used: PCP Call - No Triage-A-AH

## 2024-02-04 ENCOUNTER — Telehealth: Payer: Self-pay

## 2024-02-04 ENCOUNTER — Ambulatory Visit
Admission: RE | Admit: 2024-02-04 | Discharge: 2024-02-04 | Disposition: A | Discharge status: Home / Self Care | Source: Ambulatory Visit | Attending: Radiation Oncology | Admitting: Radiation Oncology

## 2024-02-04 ENCOUNTER — Other Ambulatory Visit: Payer: Self-pay

## 2024-02-04 DIAGNOSIS — Z510 Encounter for antineoplastic radiation therapy: Secondary | ICD-10-CM | POA: Diagnosis not present

## 2024-02-04 LAB — RAD ONC ARIA SESSION SUMMARY
Course Elapsed Days: 8
Plan Fractions Treated to Date: 7
Plan Prescribed Dose Per Fraction: 2.5 Gy
Plan Total Fractions Prescribed: 12
Plan Total Prescribed Dose: 30 Gy
Reference Point Dosage Given to Date: 17.5 Gy
Reference Point Session Dosage Given: 2.5 Gy
Session Number: 7

## 2024-02-04 MED ORDER — TRELEGY ELLIPTA 100-62.5-25 MCG/ACT IN AEPB: 1.0000 | INHALATION_SPRAY | Freq: Every day | RESPIRATORY_TRACT | Status: AC

## 2024-02-04 NOTE — Telephone Encounter (Signed)
 I tried calling pt per CRM encounter. 2 samples of Trelegy are left in the front office along with patient assistant papers for her to fill out and mail back. NFN

## 2024-02-04 NOTE — Telephone Encounter (Signed)
 Tried calling the patient at [phone]. Call could not be completed so unable to leave vm.  Tried calling number provided in patients chart, vm not set up so also unable to leave vm.

## 2024-02-04 NOTE — Telephone Encounter (Signed)
 Copied from CRM [id number]. Topic: Clinical - Medication Question >> Feb 03, 2024  4:40 PM Dana Roth wrote: Reason for CRM: Pt is requesting samples of Fluticasone-Umeclidin-Vilant (TRELEGY ELLIPTA ) 100-62.5-25 MCG/ACT AEPB to be left at the front desk for her to pick up this coming Friday 02/05/2024. Pt stated her insurance won't cover the Fluticasone-Umeclidin-Vilant (TRELEGY ELLIPTA ) 100-62.5-25 MCG/ACT AEPB, but her provider assists with samples of the medication. Pt would like a confirmation call that the samples would be available Friday morning before she makes a trip to the clinic. The pt's phone number is [phone].   ATC X1. Unable to lvm as vm is not set up. I will leave 2 samples of Trelegy at the front desk but I will also leave PAP as well that will help her.

## 2024-02-05 ENCOUNTER — Other Ambulatory Visit: Payer: Self-pay

## 2024-02-05 ENCOUNTER — Ambulatory Visit
Admission: RE | Admit: 2024-02-05 | Discharge: 2024-02-05 | Disposition: A | Discharge status: Home / Self Care | Source: Ambulatory Visit | Attending: Radiation Oncology | Admitting: Radiation Oncology

## 2024-02-05 DIAGNOSIS — Z510 Encounter for antineoplastic radiation therapy: Secondary | ICD-10-CM | POA: Diagnosis not present

## 2024-02-05 LAB — RAD ONC ARIA SESSION SUMMARY
Course Elapsed Days: 9
Plan Fractions Treated to Date: 8
Plan Prescribed Dose Per Fraction: 2.5 Gy
Plan Total Fractions Prescribed: 12
Plan Total Prescribed Dose: 30 Gy
Reference Point Dosage Given to Date: 20 Gy
Reference Point Session Dosage Given: 2.5 Gy
Session Number: 8

## 2024-02-08 ENCOUNTER — Ambulatory Visit
Admission: RE | Admit: 2024-02-08 | Discharge: 2024-02-08 | Disposition: A | Discharge status: Home / Self Care | Source: Ambulatory Visit | Attending: Radiation Oncology | Admitting: Radiation Oncology

## 2024-02-08 ENCOUNTER — Ambulatory Visit
Admission: RE | Admit: 2024-02-08 | Discharge: 2024-02-08 | Disposition: A | Discharge status: Home / Self Care | Source: Ambulatory Visit | Attending: Radiation Oncology

## 2024-02-08 ENCOUNTER — Other Ambulatory Visit: Payer: Self-pay

## 2024-02-08 DIAGNOSIS — Z510 Encounter for antineoplastic radiation therapy: Secondary | ICD-10-CM | POA: Diagnosis not present

## 2024-02-08 LAB — RAD ONC ARIA SESSION SUMMARY
Course Elapsed Days: 12
Plan Fractions Treated to Date: 9
Plan Prescribed Dose Per Fraction: 2.5 Gy
Plan Total Fractions Prescribed: 12
Plan Total Prescribed Dose: 30 Gy
Reference Point Dosage Given to Date: 22.5 Gy
Reference Point Session Dosage Given: 2.5 Gy
Session Number: 9

## 2024-02-09 ENCOUNTER — Other Ambulatory Visit: Payer: Self-pay

## 2024-02-09 ENCOUNTER — Ambulatory Visit
Admission: RE | Admit: 2024-02-09 | Discharge: 2024-02-09 | Disposition: A | Discharge status: Home / Self Care | Source: Ambulatory Visit | Attending: Radiation Oncology | Admitting: Radiation Oncology

## 2024-02-09 ENCOUNTER — Ambulatory Visit: Admitting: Radiation Oncology

## 2024-02-09 DIAGNOSIS — Z510 Encounter for antineoplastic radiation therapy: Secondary | ICD-10-CM | POA: Diagnosis not present

## 2024-02-09 LAB — RAD ONC ARIA SESSION SUMMARY
Course Elapsed Days: 13
Plan Fractions Treated to Date: 10
Plan Prescribed Dose Per Fraction: 2.5 Gy
Plan Total Fractions Prescribed: 12
Plan Total Prescribed Dose: 30 Gy
Reference Point Dosage Given to Date: 25 Gy
Reference Point Session Dosage Given: 2.5 Gy
Session Number: 10

## 2024-02-09 NOTE — Progress Notes (Unsigned)
 [facility]  [address] [place],  [place]  [address] [phone]  Clinic Day:  02/10/2024  Referring physician: Rosalea [name], PA   HISTORY OF PRESENT ILLNESS:  The patient is a 63 y.o. female with extensive stage small cell lung cancer, which includes spread of disease to her liver.  Unfortunately, since her last visit, the patient presented with altered mental status and slurred speech.  A stat head CT done at that time showed multiple areas of metastatic brain lesions.  Based upon these findings, the patient has received palliative whole brain radiation over the past 2 weeks.  Furthermore, she is on Decadron , for which she takes 2-1/2 tablets daily.  She comes in today before heading into a potential fifth cycle of maintenance atezolizumab .  Overall, the patient claims to be doing okay.  Her speech and mentation have definitely improved since the steroids and radiation commenced.  Understandably, she is concerned if there are other areas of metastatic disease from her small cell lung cancer.    PHYSICAL EXAM:  Blood pressure (!) 144/61, pulse 79, temperature 98.2 F (36.8 C), temperature source Oral, resp. rate 16, height 5' 4 (1.626 m), weight 175 lb 11.2 oz (79.7 kg), SpO2 95%. Wt Readings from Last 3 Encounters:  02/10/24 175 lb 11.2 oz (79.7 kg)  01/25/24 174 lb 6.4 oz (79.1 kg)  01/20/24 171 lb 15.3 oz (78 kg)   Body mass index is 30.16 kg/m. Performance status (ECOG): 1 - Symptomatic but completely ambulatory Physical Exam Constitutional:      Appearance: Normal appearance. She is not ill-appearing.     Comments: An anxious appearing woman who is in a wheelchair.   HENT:     Mouth/Throat:     Mouth: Mucous membranes are moist.     Pharynx: Oropharynx is clear. No oropharyngeal exudate or posterior oropharyngeal erythema.  Cardiovascular:     Rate and Rhythm: Normal rate and regular rhythm.     Heart sounds: No murmur heard.     No friction rub. No gallop.  Pulmonary:     Effort: Pulmonary effort is normal. No respiratory distress.     Breath sounds: Decreased breath sounds present. No wheezing, rhonchi or rales.  Abdominal:     General: Bowel sounds are normal. There is no distension.     Palpations: Abdomen is soft. There is no mass.     Tenderness: There is no abdominal tenderness.  Musculoskeletal:        General: No swelling.     Right lower leg: No edema.     Left lower leg: No edema.  Lymphadenopathy:     Cervical: No cervical adenopathy.     Upper Body:     Right upper body: No supraclavicular or axillary adenopathy.     Left upper body: No supraclavicular or axillary adenopathy.     Lower Body: No right inguinal adenopathy. No left inguinal adenopathy.  Skin:    General: Skin is warm.     Coloration: Skin is not jaundiced.     Findings: No lesion or rash.  Neurological:     General: No focal deficit present.     Mental Status: She is alert and oriented to person, place, and time. Mental status is at baseline.  Psychiatric:        Mood and Affect: Mood normal.        Behavior: Behavior normal.        Thought Content: Thought content normal.  LABS:      Latest Ref Rng & Units 02/10/2024    9:16 AM 01/20/2024   11:13 AM 01/20/2024    9:16 AM  CBC  WBC 4.0 - 10.5 K/uL 17.1  6.4  6.5   Hemoglobin 12.0 - 15.0 g/dL 84.8  84.9  84.7   Hematocrit 36.0 - 46.0 % 46.9  45.8  47.0   Platelets 150 - 400 K/uL 136  190  187       Latest Ref Rng & Units 02/10/2024    9:16 AM 01/21/2024    7:06 AM 01/20/2024   11:13 AM  CMP  Glucose 70 - 99 mg/dL 870  895  94   BUN 8 - 23 mg/dL 24  7  7    Creatinine 0.44 - 1.00 mg/dL 9.31  9.07  9.39   Sodium 135 - 145 mmol/L 135  137  138   Potassium 3.5 - 5.1 mmol/L 4.6  4.0  4.0   Chloride 98 - 111 mmol/L 99  103  104   CO2 22 - 32 mmol/L 24  25  24    Calcium 8.9 - 10.3 mg/dL 9.6  9.6  9.6   Total Protein 6.5 - 8.1 g/dL 6.7   7.3   Total Bilirubin 0.0 - 1.2 mg/dL  0.3   0.7   Alkaline Phos 38 - 126 U/L 79   85   AST 15 - 41 U/L 16   19   ALT 0 - 44 U/L 44   23    ASSESSMENT & PLAN:  A 63 y.o. female with extensive stage small cell lung cancer, including spread of disease to her brain, liver and bones.  The patient is scheduled to finish her palliative whole brain radiation tomorrow.  With respect to her Decadron , I told her to decrease it to 2 pills daily over this next week.  She will then decrease it to 1 pill daily the following week, before discontinuing Decadron  altogether.  With respect to her maintenance atezolizumab , it will be held for now.  I do want to get CT scans of her chest/abdomen/pelvis within 2 weeks to to ensure there are no other areas of disease progression outside of her brain.  If so, I would switch her from maintenance atezolizumab  to another agent.  I will see her back in 2 weeks to go over her CT scan images and their implications.  The patient understands all the plans discussed today and is in agreement with them.  Saffron Busey DELENA Kerns, MD

## 2024-02-10 ENCOUNTER — Other Ambulatory Visit: Payer: Self-pay | Admitting: Oncology

## 2024-02-10 ENCOUNTER — Inpatient Hospital Stay: Admitting: Oncology

## 2024-02-10 ENCOUNTER — Ambulatory Visit
Admission: RE | Admit: 2024-02-10 | Discharge: 2024-02-10 | Disposition: A | Discharge status: Home / Self Care | Source: Ambulatory Visit | Attending: Radiation Oncology

## 2024-02-10 ENCOUNTER — Inpatient Hospital Stay

## 2024-02-10 ENCOUNTER — Other Ambulatory Visit: Payer: Self-pay

## 2024-02-10 ENCOUNTER — Encounter: Payer: Self-pay | Admitting: Oncology

## 2024-02-10 ENCOUNTER — Telehealth: Payer: Self-pay | Admitting: Oncology

## 2024-02-10 VITALS — BP 144/61 | HR 79 | Temp 98.2°F | Resp 16 | Ht 64.0 in | Wt 175.7 lb

## 2024-02-10 DIAGNOSIS — C3412 Malignant neoplasm of upper lobe, left bronchus or lung: Secondary | ICD-10-CM

## 2024-02-10 DIAGNOSIS — Z510 Encounter for antineoplastic radiation therapy: Secondary | ICD-10-CM | POA: Diagnosis not present

## 2024-02-10 LAB — RAD ONC ARIA SESSION SUMMARY
Course Elapsed Days: 14
Plan Fractions Treated to Date: 11
Plan Prescribed Dose Per Fraction: 2.5 Gy
Plan Total Fractions Prescribed: 12
Plan Total Prescribed Dose: 30 Gy
Reference Point Dosage Given to Date: 27.5 Gy
Reference Point Session Dosage Given: 2.5 Gy
Session Number: 11

## 2024-02-10 LAB — CMP (CANCER CENTER ONLY)
ALT: 44 U/L (ref 0–44)
AST: 16 U/L (ref 15–41)
Albumin: 4.1 g/dL (ref 3.5–5.0)
Alkaline Phosphatase: 79 U/L (ref 38–126)
Anion gap: 13 (ref 5–15)
BUN: 24 mg/dL — ABNORMAL HIGH (ref 8–23)
CO2: 24 mmol/L (ref 22–32)
Calcium: 9.6 mg/dL (ref 8.9–10.3)
Chloride: 99 mmol/L (ref 98–111)
Creatinine: 0.68 mg/dL (ref 0.44–1.00)
GFR, Estimated: >60 mL/min (ref >60)
Glucose, Bld: 129 mg/dL — ABNORMAL HIGH (ref 70–99)
Potassium: 4.6 mmol/L (ref 3.5–5.1)
Sodium: 135 mmol/L (ref 135–145)
Total Bilirubin: 0.3 mg/dL (ref 0.0–1.2)
Total Protein: 6.7 g/dL (ref 6.5–8.1)

## 2024-02-10 LAB — CBC WITH DIFFERENTIAL (CANCER CENTER ONLY)
Abs Immature Granulocytes: 0.89 K/uL — ABNORMAL HIGH (ref 0.00–0.07)
Basophils Absolute: 0.1 K/uL (ref 0.0–0.1)
Basophils Relative: 0 %
Eosinophils Absolute: 0 K/uL (ref 0.0–0.5)
Eosinophils Relative: 0 %
HCT: 46.9 % — ABNORMAL HIGH (ref 36.0–46.0)
Hemoglobin: 15.1 g/dL — ABNORMAL HIGH (ref 12.0–15.0)
Immature Granulocytes: 5 %
Immature Platelet Fraction: 3.6 % (ref 1.2–8.6)
Lymphocytes Relative: 3 %
Lymphs Abs: 0.5 K/uL — ABNORMAL LOW (ref 0.7–4.0)
MCH: 28.9 pg (ref 26.0–34.0)
MCHC: 32.2 g/dL (ref 30.0–36.0)
MCV: 89.7 fL (ref 80.0–100.0)
Monocytes Absolute: 0.5 K/uL (ref 0.1–1.0)
Monocytes Relative: 3 %
Neutro Abs: 15.1 K/uL — ABNORMAL HIGH (ref 1.7–7.7)
Neutrophils Relative %: 89 %
Platelet Count: 136 K/uL — ABNORMAL LOW (ref 150–400)
RBC: 5.23 MIL/uL — ABNORMAL HIGH (ref 3.87–5.11)
RDW: 13.5 % (ref 11.5–15.5)
WBC Count: 17.1 K/uL — ABNORMAL HIGH (ref 4.0–10.5)
nRBC: 0 % (ref 0.0–0.2)

## 2024-02-10 MED ORDER — SODIUM CHLORIDE 0.9% FLUSH
10.0000 mL | Freq: Once | INTRAVENOUS | Status: AC
Start: 2024-02-10 — End: 2024-02-10
  Administered 2024-02-10: 10 mL

## 2024-02-10 MED ORDER — HEPARIN SOD (PORK) LOCK FLUSH 100 UNIT/ML IV SOLN
500.0000 [IU] | Freq: Once | INTRAVENOUS | Status: AC
Start: 2024-02-10 — End: 2024-02-10
  Administered 2024-02-10: 500 [IU]

## 2024-02-10 NOTE — Telephone Encounter (Signed)
 Patient has been scheduled for follow-up visit per 02/10/24 LOS.  Pt given an appt calendar with date and time.

## 2024-02-11 ENCOUNTER — Other Ambulatory Visit: Payer: Self-pay

## 2024-02-11 ENCOUNTER — Ambulatory Visit
Admission: RE | Admit: 2024-02-11 | Discharge: 2024-02-11 | Disposition: A | Discharge status: Home / Self Care | Source: Ambulatory Visit | Attending: Radiation Oncology

## 2024-02-11 DIAGNOSIS — Z510 Encounter for antineoplastic radiation therapy: Secondary | ICD-10-CM | POA: Diagnosis not present

## 2024-02-11 LAB — RAD ONC ARIA SESSION SUMMARY
Course Elapsed Days: 15
Plan Fractions Treated to Date: 12
Plan Prescribed Dose Per Fraction: 2.5 Gy
Plan Total Fractions Prescribed: 12
Plan Total Prescribed Dose: 30 Gy
Reference Point Dosage Given to Date: 30 Gy
Reference Point Session Dosage Given: 2.5 Gy
Session Number: 12

## 2024-02-12 NOTE — Radiation Completion Notes (Signed)
 Patient Name: Dana Roth, Dana Roth MRN: [id number] Date of Birth: 1960-11-28 Referring Physician: ROSINA AMY, M.D. Date of Service: 2024-02-12 Radiation Oncologist: Lynwood Cedar, M.D. [facility]                             RADIATION ONCOLOGY END OF TREATMENT NOTE     Diagnosis: C79.31 Secondary malignant neoplasm of brain Intent: Palliative     ==========DELIVERED PLANS==========  First Treatment Date: 2024-01-27 Last Treatment Date: 2024-02-11   Plan Name: Brain_FIF Site: Brain Technique: Isodose Plan Mode: Photon Dose Per Fraction: 2.5 Gy Prescribed Dose (Delivered / Prescribed): 30 Gy / 30 Gy Prescribed Fxs (Delivered / Prescribed): 12 / 12     ==========ON TREATMENT VISIT DATES========== 2024-02-02, 2024-02-08     ==========UPCOMING VISITS==========       ==========APPENDIX - ON TREATMENT VISIT NOTES==========   See weekly On Treatment Notes in Epic for details in the Media tab (listed as Progress notes on the On Treatment Visit Dates listed above).

## 2024-02-15 ENCOUNTER — Other Ambulatory Visit: Payer: Self-pay

## 2024-02-22 ENCOUNTER — Telehealth: Payer: Self-pay | Admitting: Oncology

## 2024-02-22 ENCOUNTER — Inpatient Hospital Stay: Attending: Oncology

## 2024-02-22 DIAGNOSIS — C7931 Secondary malignant neoplasm of brain: Secondary | ICD-10-CM | POA: Diagnosis not present

## 2024-02-22 DIAGNOSIS — C3412 Malignant neoplasm of upper lobe, left bronchus or lung: Secondary | ICD-10-CM

## 2024-02-22 DIAGNOSIS — Z5112 Encounter for antineoplastic immunotherapy: Secondary | ICD-10-CM | POA: Insufficient documentation

## 2024-02-22 DIAGNOSIS — Z79620 Long term (current) use of immunosuppressive biologic: Secondary | ICD-10-CM | POA: Diagnosis not present

## 2024-02-22 DIAGNOSIS — C787 Secondary malignant neoplasm of liver and intrahepatic bile duct: Secondary | ICD-10-CM | POA: Diagnosis not present

## 2024-02-22 DIAGNOSIS — C3492 Malignant neoplasm of unspecified part of left bronchus or lung: Secondary | ICD-10-CM | POA: Insufficient documentation

## 2024-02-22 LAB — CMP (CANCER CENTER ONLY)
ALT: 43 U/L (ref 0–44)
AST: 20 U/L (ref 15–41)
Albumin: 4 g/dL (ref 3.5–5.0)
Alkaline Phosphatase: 63 U/L (ref 38–126)
Anion gap: 11 (ref 5–15)
BUN: 21 mg/dL (ref 8–23)
CO2: 27 mmol/L (ref 22–32)
Calcium: 9.2 mg/dL (ref 8.9–10.3)
Chloride: 101 mmol/L (ref 98–111)
Creatinine: 0.73 mg/dL (ref 0.44–1.00)
GFR, Estimated: >60 mL/min (ref >60)
Glucose, Bld: 120 mg/dL — ABNORMAL HIGH (ref 70–99)
Potassium: 4.3 mmol/L (ref 3.5–5.1)
Sodium: 140 mmol/L (ref 135–145)
Total Bilirubin: 0.4 mg/dL (ref 0.0–1.2)
Total Protein: 6.3 g/dL — ABNORMAL LOW (ref 6.5–8.1)

## 2024-02-22 LAB — CBC WITH DIFFERENTIAL (CANCER CENTER ONLY)
Abs Immature Granulocytes: 0.22 K/uL — ABNORMAL HIGH (ref 0.00–0.07)
Basophils Absolute: 0 K/uL (ref 0.0–0.1)
Basophils Relative: 0 %
Eosinophils Absolute: 0 K/uL (ref 0.0–0.5)
Eosinophils Relative: 0 %
HCT: 41.4 % (ref 36.0–46.0)
Hemoglobin: 13.5 g/dL (ref 12.0–15.0)
Immature Granulocytes: 3 %
Lymphocytes Relative: 5 %
Lymphs Abs: 0.4 K/uL — ABNORMAL LOW (ref 0.7–4.0)
MCH: 29.8 pg (ref 26.0–34.0)
MCHC: 32.6 g/dL (ref 30.0–36.0)
MCV: 91.4 fL (ref 80.0–100.0)
Monocytes Absolute: 0.2 K/uL (ref 0.1–1.0)
Monocytes Relative: 2 %
Neutro Abs: 7.5 K/uL (ref 1.7–7.7)
Neutrophils Relative %: 90 %
Platelet Count: 101 K/uL — ABNORMAL LOW (ref 150–400)
RBC: 4.53 MIL/uL (ref 3.87–5.11)
RDW: 15 % (ref 11.5–15.5)
WBC Count: 8.3 K/uL (ref 4.0–10.5)
nRBC: 0 % (ref 0.0–0.2)

## 2024-02-22 NOTE — Progress Notes (Unsigned)
 {  Select_TRH_Note:[id number]}

## 2024-02-22 NOTE — Telephone Encounter (Signed)
 02/22/24 Patient requesting port to be accessed before ct scans.Spoke with Bartley and she accessed port.

## 2024-02-23 NOTE — Progress Notes (Unsigned)
 [facility]  [address] [place],  [place]  [address] [phone]  Clinic Day:  02/23/2024  Referring physician: Rosalea [name], PA   HISTORY OF PRESENT ILLNESS:  The patient is a 63 y.o. 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  She recently completed palliative whole brain radiation.  Of note, the patient had completed 4 cycles of maintenance atezolizumab  before her metastatic brain lesions were found.  This was preceded by 4 cycles of carboplatin /etoposide /atezolizumab .  She essentially comes in today to go over her CT scans to ensure there has been no evidence of disease progression outside of the brain since being placed on maintenance atezolizumab .  The patient does have multiple complaints today, including a very dry mouth.  She does have intermittent headaches.  However, she feels her Decadron  is making her headaches and mentation somewhat worse.  She denies having any new symptoms or findings elsewhere which concern her for other sites of disease progression.  PHYSICAL EXAM:  Blood pressure 124/70, pulse 100, temperature 98.8 F (37.1 C), temperature source Oral, resp. rate 16, height 5' 4 (1.626 m), weight 187 lb 1.6 oz (84.9 kg), SpO2 100%. Wt Readings from Last 3 Encounters:  02/24/24 187 lb 1.6 oz (84.9 kg)  02/10/24 175 lb 11.2 oz (79.7 kg)  01/25/24 174 lb 6.4 oz (79.1 kg)   Body mass index is 32.12 kg/m. Performance status (ECOG): 1 - Symptomatic but completely ambulatory Physical Exam Constitutional:      Appearance: Normal appearance. She is not ill-appearing.     Comments: An anxious appearing woman who is in a wheelchair.   HENT:     Mouth/Throat:     Mouth: Mucous membranes are moist.     Pharynx: Oropharynx is clear. No oropharyngeal exudate or posterior oropharyngeal erythema.  Cardiovascular:     Rate and Rhythm: Normal rate and regular rhythm.      Heart sounds: No murmur heard.    No friction rub. No gallop.  Pulmonary:     Effort: Pulmonary effort is normal. No respiratory distress.     Breath sounds: Decreased breath sounds present. No wheezing, rhonchi or rales.  Abdominal:     General: Bowel sounds are normal. There is no distension.     Palpations: Abdomen is soft. There is no mass.     Tenderness: There is no abdominal tenderness.  Musculoskeletal:        General: No swelling.     Right lower leg: No edema.     Left lower leg: No edema.  Lymphadenopathy:     Cervical: No cervical adenopathy.     Upper Body:     Right upper body: No supraclavicular or axillary adenopathy.     Left upper body: No supraclavicular or axillary adenopathy.     Lower Body: No right inguinal adenopathy. No left inguinal adenopathy.  Skin:    General: Skin is warm.     Coloration: Skin is not jaundiced.     Findings: No lesion or rash.  Neurological:     General: No focal deficit present.     Mental Status: She is alert and oriented to person, place, and time. Mental status is at baseline.  Psychiatric:        Mood and Affect: Mood normal.        Behavior: Behavior normal.        Thought Content: Thought content normal.  LABS:      Latest Ref Rng & Units 02/22/2024   12:03 PM 02/10/2024    9:16 AM 01/20/2024   11:13 AM  CBC  WBC 4.0 - 10.5 K/uL 8.3  17.1  6.4   Hemoglobin 12.0 - 15.0 g/dL 86.4  84.8  84.9   Hematocrit 36.0 - 46.0 % 41.4  46.9  45.8   Platelets 150 - 400 K/uL 101  136  190       Latest Ref Rng & Units 02/22/2024   12:03 PM 02/10/2024    9:16 AM 01/21/2024    7:06 AM  CMP  Glucose 70 - 99 mg/dL 879  870  895   BUN 8 - 23 mg/dL 21  24  7    Creatinine 0.44 - 1.00 mg/dL 9.26  9.31  9.07   Sodium 135 - 145 mmol/L 140  135  137   Potassium 3.5 - 5.1 mmol/L 4.3  4.6  4.0   Chloride 98 - 111 mmol/L 101  99  103   CO2 22 - 32 mmol/L 27  24  25    Calcium 8.9 - 10.3 mg/dL 9.2  9.6  9.6   Total Protein 6.5 - 8.1 g/dL 6.3   6.7    Total Bilirubin 0.0 - 1.2 mg/dL 0.4  0.3    Alkaline Phos 38 - 126 U/L 63  79    AST 15 - 41 U/L 20  16    ALT 0 - 44 U/L 43  44     SCANS: CT scans of her chest/abdomen/pelvis on 02/23/2024 revealed the following:  ASSESSMENT & PLAN:  A 63 y.o. female with extensive stage small cell lung cancer, including spread of disease to her brain, liver and bones.  In clinic today, I went over all of her CT scan images with her, for which she could see that she still remains disease-free outside of her brain.  This suggests that her maintenance atezolizumab  has still been doing a good job with her disease management.  Based upon this, she will proceed with her fifth cycle of maintenance atezolizumab  next Wednesday, August 13th.  With respect to her dry mouth, she clearly has oral candidiasis, for which I will place her on fluconazole  for the next week.  I will also cut her Decadron  to half a tablet daily, which she will complete on Tuesday, August 12th.  I will also give her a liter of IV fluids today to ensure she is more euvolemic.  Otherwise, I will see this patient back in early September before she heads into her 5th cycle of maintenance atezolizumab  immunotherapy. The patient understands all the plans discussed today and is in agreement with them.  Nikhil Osei DELENA Kerns, MD

## 2024-02-24 ENCOUNTER — Inpatient Hospital Stay (HOSPITAL_BASED_OUTPATIENT_CLINIC_OR_DEPARTMENT_OTHER): Admitting: Oncology

## 2024-02-24 ENCOUNTER — Encounter: Payer: Self-pay | Admitting: Oncology

## 2024-02-24 ENCOUNTER — Inpatient Hospital Stay

## 2024-02-24 ENCOUNTER — Other Ambulatory Visit: Payer: Self-pay | Admitting: Oncology

## 2024-02-24 VITALS — BP 124/70 | HR 100 | Temp 98.8°F | Resp 16 | Ht 64.0 in | Wt 187.1 lb

## 2024-02-24 VITALS — BP 133/70 | HR 91 | Resp 18

## 2024-02-24 DIAGNOSIS — C3412 Malignant neoplasm of upper lobe, left bronchus or lung: Secondary | ICD-10-CM | POA: Diagnosis not present

## 2024-02-24 DIAGNOSIS — Z5112 Encounter for antineoplastic immunotherapy: Secondary | ICD-10-CM | POA: Diagnosis not present

## 2024-02-24 DIAGNOSIS — Z452 Encounter for adjustment and management of vascular access device: Secondary | ICD-10-CM

## 2024-02-24 MED ORDER — NICOTINE 14 MG/24HR TD PT24: 14.0000 mg | MEDICATED_PATCH | Freq: Every day | TRANSDERMAL | 1 refills | Status: AC

## 2024-02-24 MED ORDER — FLUCONAZOLE 100 MG PO TABS: ORAL_TABLET | ORAL | 0 refills | Status: DC

## 2024-02-24 MED ORDER — SODIUM CHLORIDE 0.9 % IV SOLN
Freq: Once | INTRAVENOUS | Status: AC
Start: 2024-02-24 — End: 2024-02-24

## 2024-02-24 NOTE — Patient Instructions (Signed)
 Dehydration, Adult Dehydration is a condition in which there is not enough water or other fluids in the body. This happens when a person loses more fluids than they take in. Important organs cannot work right without the right amount of fluids. Any loss of fluids from the body can cause dehydration. Dehydration can be mild, worse, or very bad. It should be treated right away to keep it from getting very bad. What are the causes? Conditions that cause loss of water in the body. They include: Watery poop (diarrhea). Vomiting. Sweating a lot. Fever. Infection. Peeing (urinating) a lot. Not drinking enough fluids. Certain medicines, such as medicines that take extra fluid out of the body (diuretics). Lack of safe drinking water. Not being able to get enough water and food. What increases the risk? Having a long-term (chronic) illness that has not been treated the right way, such as: Diabetes. Heart disease. Kidney disease. Being 25 years of age or older. Having a disability. Living in a place that is high above the ground or sea (high in altitude). The thinner, drier air causes more fluid loss. Doing exercises that put stress on your body for a long time. Being active when in hot places. What are the signs or symptoms? Symptoms of dehydration depend on how bad it is. Mild or worse dehydration Thirst. Dry lips or dry mouth. Feeling dizzy or light-headed. Muscle cramps. Passing little pee or dark pee. Pee may be the color of tea. Headache. Very bad dehydration Changes in skin. Skin may: Be cold to the touch (clammy). Be blotchy or pale. Not go back to normal right after you pinch it and let it go. Little or no tears, pee, or sweat. Fast breathing. Low blood pressure. Weak pulse. Pulse that is more than 100 beats a minute when you are sitting still. Other changes, such as: Feeling very thirsty. Eyes that look hollow (sunken). Cold hands and feet. Being confused. Being very  tired (lethargic) or having trouble waking from sleep. Losing weight. Loss of consciousness. How is this treated? Treatment for this condition depends on how bad your dehydration is. Treatment should start right away. Do not wait until your condition gets very bad. Very bad dehydration is an emergency. You will need to go to a hospital. Mild or worse dehydration can be treated at home. You may be asked to: Drink more fluids. Drink an oral rehydration solution (ORS). This drink gives you the right amount of fluids, salts, and minerals (electrolytes). Very bad dehydration can be treated: With fluids through an IV tube. By correcting low levels of electrolytes in the body. By treating the problem that caused your dehydration. Follow these instructions at home: Oral rehydration solution If told by your doctor, drink an ORS: Make an ORS. Use instructions on the package. Start by drinking small amounts, about  cup (120 mL) every 5-10 minutes. Slowly drink more until you have had the amount that your doctor said to have.  Eating and drinking  Drink enough clear fluid to keep your pee pale yellow. If you were told to drink an ORS, finish the ORS first. Then, start slowly drinking other clear fluids. Drink fluids such as: Water. Do not drink only water. Doing that can make the salt (sodium) level in your body get too low. Water from ice chips you suck on. Fruit juice that you have added water to (diluted). Low-calorie sports drinks. Eat foods that have the right amounts of salts and minerals, such as bananas, oranges, potatoes,  tomatoes, or spinach. Do not drink alcohol. Avoid drinks that have caffeine or sugar. These include:: High-calorie sports drinks. Fruit juice that you did not add water to. Soda. Coffee or energy drinks. Avoid foods that are greasy or have a lot of fat or sugar. General instructions Take over-the-counter and prescription medicines only as told by your doctor. Do  not take sodium tablets. Doing that can make the salt level in your body get too high. Return to your normal activities as told by your doctor. Ask your doctor what activities are safe for you. Keep all follow-up visits. Your doctor may check and change your treatment. Contact a doctor if: You have pain in your belly (abdomen) and the pain: Gets worse. Stays in one place. You have a rash. You have a stiff neck. You get angry or annoyed more easily than normal. You are more tired or have a harder time waking than normal. You feel weak or dizzy. You feel very thirsty. Get help right away if: You have any symptoms of very bad dehydration. You vomit every time you eat or drink. Your vomiting gets worse, does not go away, or you vomit blood or green stuff. You are getting treatment, but symptoms are getting worse. You have a fever. You have a very bad headache. You have: Diarrhea that gets worse or does not go away. Blood in your poop (stool). This may cause poop to look black and tarry. No pee in 6-8 hours. Only a small amount of pee in 6-8 hours, and the pee is very dark. You have trouble breathing. These symptoms may be an emergency. Get help right away. Call 911. Do not wait to see if the symptoms will go away. Do not drive yourself to the hospital. This information is not intended to replace advice given to you by your health care provider. Make sure you discuss any questions you have with your health care provider. Document Revised: 02/03/2022 Document Reviewed: 02/03/2022 Elsevier Patient Education  2024 [employer].

## 2024-02-25 ENCOUNTER — Other Ambulatory Visit: Payer: Self-pay

## 2024-03-01 ENCOUNTER — Other Ambulatory Visit: Payer: Self-pay | Admitting: Oncology

## 2024-03-01 DIAGNOSIS — C3412 Malignant neoplasm of upper lobe, left bronchus or lung: Secondary | ICD-10-CM

## 2024-03-02 ENCOUNTER — Inpatient Hospital Stay

## 2024-03-02 ENCOUNTER — Other Ambulatory Visit: Payer: Self-pay

## 2024-03-02 VITALS — BP 133/71 | HR 90 | Temp 98.0°F | Resp 18

## 2024-03-02 DIAGNOSIS — Z5112 Encounter for antineoplastic immunotherapy: Secondary | ICD-10-CM | POA: Diagnosis not present

## 2024-03-02 DIAGNOSIS — C3412 Malignant neoplasm of upper lobe, left bronchus or lung: Secondary | ICD-10-CM

## 2024-03-02 LAB — CBC WITH DIFFERENTIAL (CANCER CENTER ONLY)
Abs Immature Granulocytes: 0.3 K/uL — ABNORMAL HIGH (ref 0.00–0.07)
Basophils Absolute: 0 K/uL (ref 0.0–0.1)
Basophils Relative: 0 %
Eosinophils Absolute: 0 K/uL (ref 0.0–0.5)
Eosinophils Relative: 1 %
HCT: 39.9 % (ref 36.0–46.0)
Hemoglobin: 12.9 g/dL (ref 12.0–15.0)
Immature Granulocytes: 5 %
Lymphocytes Relative: 8 %
Lymphs Abs: 0.5 K/uL — ABNORMAL LOW (ref 0.7–4.0)
MCH: 29.9 pg (ref 26.0–34.0)
MCHC: 32.3 g/dL (ref 30.0–36.0)
MCV: 92.4 fL (ref 80.0–100.0)
Monocytes Absolute: 0.2 K/uL (ref 0.1–1.0)
Monocytes Relative: 3 %
Neutro Abs: 5.5 K/uL (ref 1.7–7.7)
Neutrophils Relative %: 83 %
Platelet Count: 108 K/uL — ABNORMAL LOW (ref 150–400)
RBC: 4.32 MIL/uL (ref 3.87–5.11)
RDW: 16.2 % — ABNORMAL HIGH (ref 11.5–15.5)
WBC Count: 6.6 K/uL (ref 4.0–10.5)
nRBC: 0.3 % — ABNORMAL HIGH (ref 0.0–0.2)

## 2024-03-02 LAB — CMP (CANCER CENTER ONLY)
ALT: 57 U/L — ABNORMAL HIGH (ref 0–44)
AST: 27 U/L (ref 15–41)
Albumin: 4.3 g/dL (ref 3.5–5.0)
Alkaline Phosphatase: 55 U/L (ref 38–126)
Anion gap: 11 (ref 5–15)
BUN: 26 mg/dL — ABNORMAL HIGH (ref 8–23)
CO2: 26 mmol/L (ref 22–32)
Calcium: 9.2 mg/dL (ref 8.9–10.3)
Chloride: 102 mmol/L (ref 98–111)
Creatinine: 0.6 mg/dL (ref 0.44–1.00)
GFR, Estimated: >60 mL/min (ref >60)
Glucose, Bld: 112 mg/dL — ABNORMAL HIGH (ref 70–99)
Potassium: 4.6 mmol/L (ref 3.5–5.1)
Sodium: 139 mmol/L (ref 135–145)
Total Bilirubin: 0.3 mg/dL (ref 0.0–1.2)
Total Protein: 6.4 g/dL — ABNORMAL LOW (ref 6.5–8.1)

## 2024-03-02 LAB — TSH: TSH: 4.18 u[IU]/mL (ref 0.350–4.500)

## 2024-03-02 MED ORDER — SODIUM CHLORIDE 0.9 % IV SOLN: INTRAVENOUS | Status: DC

## 2024-03-02 MED ORDER — SODIUM CHLORIDE 0.9% FLUSH: 10.0000 mL | INTRAVENOUS | Status: DC | PRN

## 2024-03-02 MED ORDER — SODIUM CHLORIDE 0.9 % IV SOLN
1200.0000 mg | Freq: Once | INTRAVENOUS | Status: AC
  Administered 2024-03-02 (×2): 1200 mg via INTRAVENOUS
  Filled 2024-03-02: qty 20

## 2024-03-02 NOTE — Patient Instructions (Signed)
 Atezolizumab  Injection What is this medication? ATEZOLIZUMAB  (a te zoe LIZ ue mab) treats some types of cancer. It works by helping your immune system slow or stop the spread of cancer cells. It is a monoclonal antibody. This medicine may be used for other purposes; ask your health care provider or pharmacist if you have questions. COMMON BRAND NAME(S): Tecentriq  What should I tell my care team before I take this medication? They need to know if you have any of these conditions: Allogeneic stem cell transplant (uses someone else's stem cells) Autoimmune diseases, such as Crohn disease, ulcerative colitis, lupus History of chest radiation Nervous system problems, such as Guillain-Barre syndrome, myasthenia gravis Organ transplant An unusual or allergic reaction to atezolizumab , other medications, foods, dyes, or preservatives Pregnant or trying to get pregnant Breast-feeding How should I use this medication? This medication is injected into a vein. It is given by your care team in a hospital or clinic setting. A special MedGuide will be given to you before each treatment. Be sure to read this information carefully each time. Talk to your care team about the use of this medication in children. While it may be prescribed for children as young as 2 years for selected conditions, precautions do apply. Overdosage: If you think you have taken too much of this medicine contact a poison control center or emergency room at once. NOTE: This medicine is only for you. Do not share this medicine with others. What if I miss a dose? Keep appointments for follow-up doses. It is important not to miss your dose. Call your care team if you are unable to keep an appointment. What may interact with this medication? Interactions have not been studied. This list may not describe all possible interactions. Give your health care provider a list of all the medicines, herbs, non-prescription drugs, or dietary  supplements you use. Also tell them if you smoke, drink alcohol, or use illegal drugs. Some items may interact with your medicine. What should I watch for while using this medication? Your condition will be monitored carefully while you are receiving this medication. You may need blood work done while you are taking this medication. This medication may cause serious skin reactions. They can happen weeks to months after starting the medication. Contact your care team right away if you notice fevers or flu-like symptoms with a rash. The rash may be red or purple and then turn into blisters or peeling of the skin. You may also notice a red rash with swelling of the face, lips, or lymph nodes in your neck or under your arms. Talk to your care team if you may be pregnant. Serious birth defects can occur if you take this medication during pregnancy and for 5 months after the last dose. You will need a negative pregnancy test before starting this medication. Contraception is recommended while taking this medication and for 5 months after the last dose. Your care team can help you find the option that works for you. Do not breastfeed while taking this medication and for 5 months after the last dose. This medication may cause infertility. Talk to your care team if you are concerned about your fertility. What side effects may I notice from receiving this medication? Side effects that you should report to your doctor or health care professional as soon as possible: Allergic reactions--skin rash, itching, hives, swelling of the face, lips, tongue, or throat Dry cough, shortness of breath or trouble breathing Eye pain, redness, irritation, or discharge  with blurry or decreased vision Heart muscle inflammation--unusual weakness or fatigue, shortness of breath, chest pain, fast or irregular heartbeat, dizziness, swelling of the ankles, feet, or hands Hormone gland problems--headache, sensitivity to light, unusual  weakness or fatigue, dizziness, fast or irregular heartbeat, increased sensitivity to cold or heat, excessive sweating, constipation, hair loss, increased thirst or amount of urine, tremors or shaking, irritability Infusion reactions--chest pain, shortness of breath or trouble breathing, feeling faint or lightheaded Kidney injury (glomerulonephritis)--decrease in the amount of urine, red or dark brown urine, foamy or bubbly urine, swelling of the ankles, hands, or feet Liver injury--right upper belly pain, loss of appetite, nausea, light-colored stool, dark yellow or brown urine, yellowing skin or eyes, unusual weakness or fatigue Pain, tingling, or numbness in the hands or feet, muscle weakness, change in vision, confusion or trouble speaking, loss of balance or coordination, trouble walking, seizures Rash, fever, and swollen lymph nodes Redness, blistering, peeling, or loosening of the skin, including inside the mouth Sudden or severe stomach pain, bloody diarrhea, fever, nausea, vomiting Side effects that usually do not require medical attention (report to your doctor or health care professional if they continue or are bothersome): Bone, joint, or muscle pain Diarrhea Fatigue Loss of appetite Nausea Skin rash This list may not describe all possible side effects. Call your doctor for medical advice about side effects. You may report side effects to FDA at 1-800-FDA-1088. Where should I keep my medication? This medication is given in a hospital or clinic. It will not be stored at home. NOTE: This sheet is a summary. It may not cover all possible information. If you have questions about this medicine, talk to your doctor, pharmacist, or health care provider.  2024 Elsevier/Gold Standard (2023-04-22 00:00:00)

## 2024-03-03 ENCOUNTER — Inpatient Hospital Stay

## 2024-03-03 LAB — T4: T4, Total: 5.3 ug/dL (ref 4.5–12.0)

## 2024-03-03 NOTE — Progress Notes (Signed)
 [facility] CSW Progress Note  Due to patient's history of being overwhelmed with medical / insurance related documents. Patient periodically drops off documents for Clinical Social Worker to review. CSW sorted through documents and placed in separate stacks for patient. CSW also provided new [facility] Financial Application for patient to sign at next appointment. CSW placed documents at behind registration desk for patient to pick up.    Lizbeth Sprague, LCSW Clinical Social Worker [facility]

## 2024-03-17 ENCOUNTER — Other Ambulatory Visit: Payer: Self-pay | Admitting: Hematology and Oncology

## 2024-03-17 ENCOUNTER — Inpatient Hospital Stay (HOSPITAL_BASED_OUTPATIENT_CLINIC_OR_DEPARTMENT_OTHER): Admitting: Hematology and Oncology

## 2024-03-17 ENCOUNTER — Ambulatory Visit

## 2024-03-17 ENCOUNTER — Inpatient Hospital Stay

## 2024-03-17 ENCOUNTER — Telehealth: Payer: Self-pay

## 2024-03-17 ENCOUNTER — Other Ambulatory Visit: Payer: Self-pay

## 2024-03-17 ENCOUNTER — Encounter (HOSPITAL_COMMUNITY): Payer: Self-pay

## 2024-03-17 ENCOUNTER — Emergency Department (HOSPITAL_COMMUNITY)
Admission: EM | Admit: 2024-03-17 | Discharge: 2024-03-18 | Discharge status: Against Medical Advice | Source: Ambulatory Visit | Attending: Emergency Medicine | Admitting: Emergency Medicine

## 2024-03-17 VITALS — BP 156/90 | HR 90 | Temp 98.2°F | Resp 20

## 2024-03-17 DIAGNOSIS — R410 Disorientation, unspecified: Secondary | ICD-10-CM | POA: Insufficient documentation

## 2024-03-17 DIAGNOSIS — Z85118 Personal history of other malignant neoplasm of bronchus and lung: Secondary | ICD-10-CM | POA: Insufficient documentation

## 2024-03-17 DIAGNOSIS — Z79899 Other long term (current) drug therapy: Secondary | ICD-10-CM | POA: Diagnosis not present

## 2024-03-17 DIAGNOSIS — C3412 Malignant neoplasm of upper lobe, left bronchus or lung: Secondary | ICD-10-CM | POA: Diagnosis not present

## 2024-03-17 DIAGNOSIS — R519 Headache, unspecified: Secondary | ICD-10-CM | POA: Insufficient documentation

## 2024-03-17 DIAGNOSIS — Z5112 Encounter for antineoplastic immunotherapy: Secondary | ICD-10-CM | POA: Diagnosis not present

## 2024-03-17 DIAGNOSIS — Z452 Encounter for adjustment and management of vascular access device: Secondary | ICD-10-CM

## 2024-03-17 LAB — CBC WITH DIFFERENTIAL/PLATELET
Abs Immature Granulocytes: 0.07 K/uL (ref 0.00–0.07)
Basophils Absolute: 0 K/uL (ref 0.0–0.1)
Basophils Relative: 0 %
Eosinophils Absolute: 0 K/uL (ref 0.0–0.5)
Eosinophils Relative: 1 %
HCT: 41 % (ref 36.0–46.0)
Hemoglobin: 12.6 g/dL (ref 12.0–15.0)
Immature Granulocytes: 1 %
Lymphocytes Relative: 16 %
Lymphs Abs: 1 K/uL (ref 0.7–4.0)
MCH: 29.5 pg (ref 26.0–34.0)
MCHC: 30.7 g/dL (ref 30.0–36.0)
MCV: 96 fL (ref 80.0–100.0)
Monocytes Absolute: 0.3 K/uL (ref 0.1–1.0)
Monocytes Relative: 4 %
Neutro Abs: 5.1 K/uL (ref 1.7–7.7)
Neutrophils Relative %: 78 %
Platelets: 201 K/uL (ref 150–400)
RBC: 4.27 MIL/uL (ref 3.87–5.11)
RDW: 16.6 % — ABNORMAL HIGH (ref 11.5–15.5)
WBC: 6.5 K/uL (ref 4.0–10.5)
nRBC: 0 % (ref 0.0–0.2)

## 2024-03-17 LAB — CMP (CANCER CENTER ONLY)
ALT: 32 U/L (ref 0–44)
AST: 18 U/L (ref 15–41)
Albumin: 4.2 g/dL (ref 3.5–5.0)
Alkaline Phosphatase: 80 U/L (ref 38–126)
Anion gap: 12 (ref 5–15)
BUN: 14 mg/dL (ref 8–23)
CO2: 27 mmol/L (ref 22–32)
Calcium: 9.8 mg/dL (ref 8.9–10.3)
Chloride: 101 mmol/L (ref 98–111)
Creatinine: 0.65 mg/dL (ref 0.44–1.00)
GFR, Estimated: >60 mL/min (ref >60)
Glucose, Bld: 91 mg/dL (ref 70–99)
Potassium: 3.8 mmol/L (ref 3.5–5.1)
Sodium: 140 mmol/L (ref 135–145)
Total Bilirubin: 0.3 mg/dL (ref 0.0–1.2)
Total Protein: 7.1 g/dL (ref 6.5–8.1)

## 2024-03-17 LAB — CBC WITH DIFFERENTIAL (CANCER CENTER ONLY)
Abs Immature Granulocytes: 0.06 K/uL (ref 0.00–0.07)
Basophils Absolute: 0 K/uL (ref 0.0–0.1)
Basophils Relative: 0 %
Eosinophils Absolute: 0.1 K/uL (ref 0.0–0.5)
Eosinophils Relative: 1 %
HCT: 39 % (ref 36.0–46.0)
Hemoglobin: 12.6 g/dL (ref 12.0–15.0)
Immature Granulocytes: 1 %
Lymphocytes Relative: 15 %
Lymphs Abs: 0.9 K/uL (ref 0.7–4.0)
MCH: 30.4 pg (ref 26.0–34.0)
MCHC: 32.3 g/dL (ref 30.0–36.0)
MCV: 94 fL (ref 80.0–100.0)
Monocytes Absolute: 0.3 K/uL (ref 0.1–1.0)
Monocytes Relative: 5 %
Neutro Abs: 4.6 K/uL (ref 1.7–7.7)
Neutrophils Relative %: 78 %
Platelet Count: 185 K/uL (ref 150–400)
RBC: 4.15 MIL/uL (ref 3.87–5.11)
RDW: 16.7 % — ABNORMAL HIGH (ref 11.5–15.5)
WBC Count: 5.9 K/uL (ref 4.0–10.5)
nRBC: 0 % (ref 0.0–0.2)

## 2024-03-17 LAB — URINALYSIS, ROUTINE W REFLEX MICROSCOPIC
Bilirubin Urine: NEGATIVE
Glucose, UA: NEGATIVE mg/dL
Hgb urine dipstick: NEGATIVE
Ketones, ur: NEGATIVE mg/dL
Leukocytes,Ua: NEGATIVE
Nitrite: NEGATIVE
Protein, ur: NEGATIVE mg/dL
Specific Gravity, Urine: 1.014 (ref 1.005–1.030)
pH: 5 (ref 5.0–8.0)

## 2024-03-17 LAB — COMPREHENSIVE METABOLIC PANEL WITH GFR
ALT: 33 U/L (ref 0–44)
AST: 22 U/L (ref 15–41)
Albumin: 4.1 g/dL (ref 3.5–5.0)
Alkaline Phosphatase: 78 U/L (ref 38–126)
Anion gap: 11 (ref 5–15)
BUN: 12 mg/dL (ref 8–23)
CO2: 26 mmol/L (ref 22–32)
Calcium: 9.4 mg/dL (ref 8.9–10.3)
Chloride: 102 mmol/L (ref 98–111)
Creatinine, Ser: 0.64 mg/dL (ref 0.44–1.00)
GFR, Estimated: >60 mL/min (ref >60)
Glucose, Bld: 88 mg/dL (ref 70–99)
Potassium: 4.1 mmol/L (ref 3.5–5.1)
Sodium: 138 mmol/L (ref 135–145)
Total Bilirubin: 0.4 mg/dL (ref 0.0–1.2)
Total Protein: 6.8 g/dL (ref 6.5–8.1)

## 2024-03-17 LAB — CBG MONITORING, ED: Glucose-Capillary: 89 mg/dL (ref 70–99)

## 2024-03-17 MED ORDER — SODIUM CHLORIDE 0.9 % IV SOLN: Freq: Once | INTRAVENOUS | Status: AC

## 2024-03-17 MED ORDER — TRAMADOL HCL 50 MG PO TABS: 50.0000 mg | ORAL_TABLET | Freq: Four times a day (QID) | ORAL | 0 refills | Status: DC | PRN

## 2024-03-17 MED ORDER — HYDROMORPHONE HCL 1 MG/ML IJ SOLN
1.0000 mg | Freq: Once | INTRAMUSCULAR | Status: AC
  Administered 2024-03-17: 1 mg via INTRAVENOUS
  Filled 2024-03-17: qty 1

## 2024-03-17 NOTE — ED Triage Notes (Signed)
 Pt presents to ED from home C/O headaches X 3 days. Reports hx lung CA, with mets to brain.   Pt and family member stating, the cancer center in [place] sent us  here for a brain scan.  However, the note from oncology today reports that the patient is on a palliative treatment plan and does not need further scans to confirm diagnosis/prognosis. Per the note from oncology today, family is requesting further scans of brain.

## 2024-03-17 NOTE — Telephone Encounter (Signed)
 She is weak, hasn't felt good for last 3 days. She is having to use the walker in the house (new). Req an appt to see provider. No fever, chills, N/V, cough, or congestion. Decreased appetite, & fluid intake.

## 2024-03-17 NOTE — ED Triage Notes (Signed)
 Patient sent from [facility] for MRI.

## 2024-03-17 NOTE — Progress Notes (Signed)
 [facility]  [address] [place],  [place]  [address] [phone]  Clinic Day:  02/23/2024  Referring physician: Rosalea [name], PA   HISTORY OF PRESENT ILLNESS:  The patient is a 63 y.o. 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  She recently completed palliative whole brain radiation.  Of note, the patient had completed 4 cycles of maintenance atezolizumab  before her metastatic brain lesions were found.  This was preceded by 4 cycles of carboplatin /etoposide /atezolizumab .  She essentially comes in today to go over her CT scans to ensure there has been no evidence of disease progression outside of the brain since being placed on maintenance atezolizumab .  The patient does have multiple complaints today, including a very dry mouth.  She does have intermittent headaches.  However, she feels her Decadron  is making her headaches and mentation somewhat worse.  She denies having any new symptoms or findings elsewhere which concern her for other sites of disease progression.  PHYSICAL EXAM:  There were no vitals taken for this visit. Wt Readings from Last 3 Encounters:  02/24/24 187 lb 1.6 oz (84.9 kg)  02/10/24 175 lb 11.2 oz (79.7 kg)  01/25/24 174 lb 6.4 oz (79.1 kg)   There is no height or weight on file to calculate BMI. Performance status (ECOG): 1 - Symptomatic but completely ambulatory Physical Exam Constitutional:      Appearance: Normal appearance. She is not ill-appearing.     Comments: An anxious appearing woman who is in a wheelchair.   HENT:     Mouth/Throat:     Mouth: Mucous membranes are moist.     Pharynx: Oropharynx is clear. No oropharyngeal exudate or posterior oropharyngeal erythema.  Cardiovascular:     Rate and Rhythm: Normal rate and regular rhythm.     Heart sounds: No murmur heard.    No friction rub. No gallop.  Pulmonary:     Effort: Pulmonary effort is  normal. No respiratory distress.     Breath sounds: Decreased breath sounds present. No wheezing, rhonchi or rales.  Abdominal:     General: Bowel sounds are normal. There is no distension.     Palpations: Abdomen is soft. There is no mass.     Tenderness: There is no abdominal tenderness.  Musculoskeletal:        General: No swelling.     Right lower leg: No edema.     Left lower leg: No edema.  Lymphadenopathy:     Cervical: No cervical adenopathy.     Upper Body:     Right upper body: No supraclavicular or axillary adenopathy.     Left upper body: No supraclavicular or axillary adenopathy.     Lower Body: No right inguinal adenopathy. No left inguinal adenopathy.  Skin:    General: Skin is warm.     Coloration: Skin is not jaundiced.     Findings: No lesion or rash.  Neurological:     General: No focal deficit present.     Mental Status: She is alert and oriented to person, place, and time. Mental status is at baseline.  Psychiatric:        Mood and Affect: Mood normal.        Behavior: Behavior normal.        Thought Content: Thought content normal.    LABS:      Latest Ref Rng & Units 03/17/2024  2:01 PM 03/02/2024   10:13 AM 02/22/2024   12:03 PM  CBC  WBC 4.0 - 10.5 K/uL 5.9  6.6  8.3   Hemoglobin 12.0 - 15.0 g/dL 87.3  87.0  86.4   Hematocrit 36.0 - 46.0 % 39.0  39.9  41.4   Platelets 150 - 400 K/uL 185  108  101       Latest Ref Rng & Units 03/17/2024    2:01 PM 03/02/2024   10:13 AM 02/22/2024   12:03 PM  CMP  Glucose 70 - 99 mg/dL 91  887  879   BUN 8 - 23 mg/dL 14  26  21    Creatinine 0.44 - 1.00 mg/dL 9.34  9.39  9.26   Sodium 135 - 145 mmol/L 140  139  140   Potassium 3.5 - 5.1 mmol/L 3.8  4.6  4.3   Chloride 98 - 111 mmol/L 101  102  101   CO2 22 - 32 mmol/L 27  26  27    Calcium 8.9 - 10.3 mg/dL 9.8  9.2  9.2   Total Protein 6.5 - 8.1 g/dL 7.1  6.4  6.3   Total Bilirubin 0.0 - 1.2 mg/dL 0.3  0.3  0.4   Alkaline Phos 38 - 126 U/L 80  55  63   AST 15  - 41 U/L 18  27  20    ALT 0 - 44 U/L 32  57  43    SCANS: CT scans of her chest/abdomen/pelvis on 02/23/2024 revealed the following:  ASSESSMENT & PLAN:  A 63 y.o. female with extensive stage small cell lung cancer, including spread of disease to her brain, liver and bones.  She comes in to clinic today for symptom management with complaints of not being able to eat or drink with joint aches and headache. She also notes swelling to her face. She has stopped her steroids and is currently on Atezolizumab . We will plan for IV fluids with pain meds. She will return to clinic as scheduled for visit with Dr. Ezzard and treatment.   Addendum: Caretaker wishes to take patient to ED at [facility] due to headaches for possible scan.    Eleanor DELENA Bach, NP

## 2024-03-17 NOTE — ED Notes (Signed)
 Report given to ED Charge Nurse. Pt sen via POV to [facility] with [facility] port accessed for MRI.

## 2024-03-17 NOTE — ED Provider Notes (Signed)
 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  She has had worsening headaches and dizziness.  She went to her oncologist today and was sent to [facility] for MRI.  MRI unable to be completed at [facility], so she was sent here.  MRI pending at shift change.   Dean Clarity, MD 03/26/24 1215

## 2024-03-17 NOTE — ED Provider Notes (Signed)
 [facility] Provider Note   CSN: [id number] Arrival date & time: 03/17/24  1732     Patient presents with: Headache   Dana Roth is a 63 y.o. female who presents emergency department chief complaint of headaches and vision changes.  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  History is given by the patient's cousin and healthcare power of attorney.  She notes that the patient has had progressively worsening confusion over the last few weeks.  She previously was on oral Decadron  which he was tapered off of.  She had palliative radiation to her brain metastasis and is currently on atezolizumab .  Her most recent scans show no progression of disease.  Patient has had progressively worsening headaches, vision change and mentation changes over the last week.  She was seen in her cancer clinic today and was treated with fluids and pain medications presented to the emergency department for brain MRI.    Headache      Prior to Admission medications   Medication Sig Start Date End Date Taking? Authorizing Provider  cetirizine (ZYRTEC) 10 MG tablet Take 10 mg by mouth as needed for allergies. 11/10/23   [provider]  clonazePAM (KLONOPIN) 1 MG tablet Take 1 mg by mouth 3 (three) times daily as needed for anxiety.    [provider]  dexamethasone  (DECADRON ) 4 MG tablet Take 2.5 tablets (10 mg total) by mouth daily. 02/02/24   Jomarie Agent, MD  diphenhydramine -acetaminophen  (TYLENOL  PM) 25-500 MG TABS tablet Take 1 tablet by mouth as needed.    [provider]  fluconazole  (DIFLUCAN ) 100 MG tablet 2 pills PO day 1, 1 pill PO days 2-7 02/24/24   Ezzard Peters A, MD  Fluticasone-Umeclidin-Vilant (TRELEGY ELLIPTA ) 100-62.5-25 MCG/ACT AEPB Inhale 1 puff into the lungs daily. Patient taking differently: Inhale 1 puff into the lungs as needed.  09/01/23   Ruthell Lauraine FALCON, NP  Fluticasone-Umeclidin-Vilant (TRELEGY ELLIPTA ) 100-62.5-25 MCG/ACT AEPB Inhale 1 puff into the lungs daily. 02/04/24   Ruthell Lauraine FALCON, NP  ipratropium-albuterol  (DUONEB) 0.5-2.5 (3) MG/3ML SOLN Take 3 mLs by nebulization every 6 (six) hours as needed. 10/15/23   Lewis, Dequincy A, MD  nicotine  (NICODERM CQ  - DOSED IN MG/24 HOURS) 14 mg/24hr patch Place 1 patch (14 mg total) onto the skin daily. 02/24/24   Lewis, Dequincy A, MD  traMADol  (ULTRAM ) 50 MG tablet Take 1-2 tablets (50-100 mg total) by mouth every 6 (six) hours as needed. 03/17/24   Harl Eleanor LABOR, NP    Allergies: Codeine    Review of Systems  Neurological:  Positive for headaches.    Updated Vital Signs BP 135/84 (BP Location: Left Arm)   Pulse (!) 101   Temp 98.3 F (36.8 C) (Oral)   Resp 20   Ht 5' 4 (1.626 m)   Wt 86.2 kg   SpO2 100%   BMI 32.61 kg/m   Physical Exam Vitals and nursing note reviewed.  Constitutional:      General: She is not in acute distress.    Appearance: She is well-developed. She is not diaphoretic.  HENT:     Head: Normocephalic and atraumatic.     Comments: Moon facies    Right Ear: External ear normal.     Left Ear: External ear normal.     Nose: Nose normal.     Mouth/Throat:  Mouth: Mucous membranes are moist.  Eyes:     General: No scleral icterus.    Conjunctiva/sclera: Conjunctivae normal.  Cardiovascular:     Rate and Rhythm: Normal rate and regular rhythm.     Heart sounds: Normal heart sounds. No murmur heard.    No friction rub. No gallop.  Pulmonary:     Effort: Pulmonary effort is normal. No respiratory distress.     Breath sounds: Normal breath sounds.  Abdominal:     General: Bowel sounds are normal. There is no distension.     Palpations: Abdomen is soft. There is no mass.     Tenderness: There is no abdominal tenderness. There is no guarding.  Musculoskeletal:     Cervical back: Normal range of motion.  Skin:    General: Skin  is warm and dry.  Neurological:     Mental Status: She is confused.     Cranial Nerves: Cranial nerves 2-12 are intact.     Motor: Motor function is intact.     Coordination: Coordination is intact.  Psychiatric:        Behavior: Behavior normal.    Moon facies labs drawn here in the emergency department.  Unfortunately (all labs ordered are listed, but only abnormal results are displayed) Labs Reviewed - No data to display  EKG: None  Radiology: No results found.   Procedures   Medications Ordered in the ED - No data to display                                  Medical Decision Making Patient presented here for worsening headaches and mental status with known brain metastatic disease from small cell lung carcinoma.  Unfortunately we do not offer MRI at this facility at this hour.  I have discussed the case with multiple physicians at [facility] including Dr. Pamella, Dr. Dean, and Dr. Bari Bough who has accepted the patient in transfer for MRI brain with and without.  I have also drawn labs which will be pending.  Patient and her healthcare power of attorney are in agreement to transfer to [facility] for MRI brain with without.  The patient is stable and appropriate for transfer if she is not significantly somnolent protecting her airway and only has mild confusion at this time.  Amount and/or Complexity of Data Reviewed Labs: ordered. Radiology: ordered.       Final diagnoses:  None    ED Discharge Orders     None          Arloa Chroman, PA-C 03/17/24 1932    Doretha Folks, MD 03/17/24 2122

## 2024-03-17 NOTE — ED Triage Notes (Incomplete)
 Pt presents to ED from C/O headaches X 3 days. Reports hx lung CA with mets to brain. Per note from oncology, caregiver

## 2024-03-18 NOTE — ED Notes (Signed)
 Chief Complaint  Patient presents with   Headache   Past Medical History:  Diagnosis Date   Arthritis    Complication of anesthesia    problem with B/P dropping   Depression    Dyslipidemia    GERD (gastroesophageal reflux disease)    HTN (hypertension)    only with MD visits, caused by anxiety,med taken caused chest pain-so stopped.   Multiple osteochondroma    Multiple bone surgeries   Needle phobia    PONV (postoperative nausea and vomiting)    Rectal bleeding    Small cell lung cancer (HCC)    Suspicious mole     BP 103/82   Pulse 100   Temp 98.2 F (36.8 C) (Oral)   Resp (!) 25   Ht 5' 4 (1.626 m)   Wt 86.2 kg   SpO2 95%   BMI 32.61 kg/m   Pt was very upset and waiting for MRI, called MRI they said they had attempted to contact me at 2300, I took over assignment at 2300, but no contacts made through [facility] or phone, I called MRI at 0219. Per MRI they were the next in line but MRI got a code-stroke patient and they were bumped again, pt upset and wanted to leave ama.   Dana Roth, Dana Roth

## 2024-03-18 NOTE — ED Notes (Signed)
 Pt left AMA. Stated she has been waiting a long time for MRI. Asked pt if it was ok for me to contact the provider along with MRI to see what her process would be like. Pt refused and left anyway

## 2024-03-22 NOTE — Progress Notes (Unsigned)
 [facility]  [address] [place],  [place]  [address] [phone]  Clinic Day:  02/23/2024  Referring physician: Rosalea [name], PA   HISTORY OF PRESENT ILLNESS:  The patient is a 63 y.o. 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  She recently completed palliative whole brain radiation.  Of note, the patient had completed 4 cycles of maintenance atezolizumab  before her metastatic brain lesions were found.  This was preceded by 4 cycles of carboplatin /etoposide /atezolizumab .  She essentially comes in today to go over her CT scans to ensure there has been no evidence of disease progression outside of the brain since being placed on maintenance atezolizumab .  The patient does have multiple complaints today, including a very dry mouth.  She does have intermittent headaches.  However, she feels her Decadron  is making her headaches and mentation somewhat worse.  She denies having any new symptoms or findings elsewhere which concern her for other sites of disease progression.  PHYSICAL EXAM:  There were no vitals taken for this visit. Wt Readings from Last 3 Encounters:  03/17/24 190 lb (86.2 kg)  02/24/24 187 lb 1.6 oz (84.9 kg)  02/10/24 175 lb 11.2 oz (79.7 kg)   There is no height or weight on file to calculate BMI. Performance status (ECOG): 1 - Symptomatic but completely ambulatory Physical Exam Constitutional:      Appearance: Normal appearance. She is not ill-appearing.     Comments: An anxious appearing woman who is in a wheelchair.   HENT:     Mouth/Throat:     Mouth: Mucous membranes are moist.     Pharynx: Oropharynx is clear. No oropharyngeal exudate or posterior oropharyngeal erythema.  Cardiovascular:     Rate and Rhythm: Normal rate and regular rhythm.     Heart sounds: No murmur heard.    No friction rub. No gallop.  Pulmonary:     Effort: Pulmonary effort is  normal. No respiratory distress.     Breath sounds: Decreased breath sounds present. No wheezing, rhonchi or rales.  Abdominal:     General: Bowel sounds are normal. There is no distension.     Palpations: Abdomen is soft. There is no mass.     Tenderness: There is no abdominal tenderness.  Musculoskeletal:        General: No swelling.     Right lower leg: No edema.     Left lower leg: No edema.  Lymphadenopathy:     Cervical: No cervical adenopathy.     Upper Body:     Right upper body: No supraclavicular or axillary adenopathy.     Left upper body: No supraclavicular or axillary adenopathy.     Lower Body: No right inguinal adenopathy. No left inguinal adenopathy.  Skin:    General: Skin is warm.     Coloration: Skin is not jaundiced.     Findings: No lesion or rash.  Neurological:     General: No focal deficit present.     Mental Status: She is alert and oriented to person, place, and time. Mental status is at baseline.  Psychiatric:        Mood and Affect: Mood normal.        Behavior: Behavior normal.        Thought Content: Thought content normal.    LABS:      Latest Ref Rng & Units 03/17/2024    7:35 PM  03/17/2024    2:01 PM 03/02/2024   10:13 AM  CBC  WBC 4.0 - 10.5 K/uL 6.5  5.9  6.6   Hemoglobin 12.0 - 15.0 g/dL 87.3  87.3  87.0   Hematocrit 36.0 - 46.0 % 41.0  39.0  39.9   Platelets 150 - 400 K/uL 201  185  108       Latest Ref Rng & Units 03/17/2024    7:35 PM 03/17/2024    2:01 PM 03/02/2024   10:13 AM  CMP  Glucose 70 - 99 mg/dL 88  91  887   BUN 8 - 23 mg/dL 12  14  26    Creatinine 0.44 - 1.00 mg/dL 9.35  9.34  9.39   Sodium 135 - 145 mmol/L 138  140  139   Potassium 3.5 - 5.1 mmol/L 4.1  3.8  4.6   Chloride 98 - 111 mmol/L 102  101  102   CO2 22 - 32 mmol/L 26  27  26    Calcium 8.9 - 10.3 mg/dL 9.4  9.8  9.2   Total Protein 6.5 - 8.1 g/dL 6.8  7.1  6.4   Total Bilirubin 0.0 - 1.2 mg/dL 0.4  0.3  0.3   Alkaline Phos 38 - 126 U/L 78  80  55   AST 15  - 41 U/L 22  18  27    ALT 0 - 44 U/L 33  32  57    SCANS: CT scans of her chest/abdomen/pelvis on 02/23/2024 revealed the following:  ASSESSMENT & PLAN:  A 63 y.o. female with extensive stage small cell lung cancer, including spread of disease to her brain, liver and bones.  In clinic today, I went over all of her CT scan images with her, for which she could see that she still remains disease-free outside of her brain.  This suggests that her maintenance atezolizumab  has still been doing a good job with her disease management.  Based upon this, she will proceed with her fifth cycle of maintenance atezolizumab  next Wednesday, August 13th.  With respect to her dry mouth, she clearly has oral candidiasis, for which I will place her on fluconazole  for the next week.  I will also cut her Decadron  to half a tablet daily, which she will complete on Tuesday, August 12th.  I will also give her a liter of IV fluids today to ensure she is more euvolemic.  Otherwise, I will see this patient back in early September before she heads into her 5th cycle of maintenance atezolizumab  immunotherapy. The patient understands all the plans discussed today and is in agreement with them.  Verginia Toohey DELENA Kerns, MD

## 2024-03-23 ENCOUNTER — Encounter: Payer: Self-pay | Admitting: Oncology

## 2024-03-23 ENCOUNTER — Inpatient Hospital Stay: Attending: Oncology | Admitting: Oncology

## 2024-03-23 ENCOUNTER — Other Ambulatory Visit: Payer: Self-pay | Admitting: Oncology

## 2024-03-23 ENCOUNTER — Ambulatory Visit: Admission: RE | Admit: 2024-03-23 | Payer: Self-pay | Source: Ambulatory Visit | Admitting: Radiation Oncology

## 2024-03-23 ENCOUNTER — Inpatient Hospital Stay

## 2024-03-23 VITALS — BP 112/64 | HR 104 | Temp 98.5°F | Resp 16 | Ht 64.0 in | Wt 192.5 lb

## 2024-03-23 VITALS — BP 131/70 | HR 103 | Temp 98.2°F | Resp 18

## 2024-03-23 DIAGNOSIS — B370 Candidal stomatitis: Secondary | ICD-10-CM | POA: Diagnosis not present

## 2024-03-23 DIAGNOSIS — C787 Secondary malignant neoplasm of liver and intrahepatic bile duct: Secondary | ICD-10-CM | POA: Diagnosis not present

## 2024-03-23 DIAGNOSIS — R519 Headache, unspecified: Secondary | ICD-10-CM | POA: Diagnosis not present

## 2024-03-23 DIAGNOSIS — Z79620 Long term (current) use of immunosuppressive biologic: Secondary | ICD-10-CM | POA: Insufficient documentation

## 2024-03-23 DIAGNOSIS — C3492 Malignant neoplasm of unspecified part of left bronchus or lung: Secondary | ICD-10-CM | POA: Diagnosis not present

## 2024-03-23 DIAGNOSIS — C7951 Secondary malignant neoplasm of bone: Secondary | ICD-10-CM | POA: Insufficient documentation

## 2024-03-23 DIAGNOSIS — C3412 Malignant neoplasm of upper lobe, left bronchus or lung: Secondary | ICD-10-CM

## 2024-03-23 DIAGNOSIS — Z5112 Encounter for antineoplastic immunotherapy: Secondary | ICD-10-CM | POA: Diagnosis present

## 2024-03-23 DIAGNOSIS — C7931 Secondary malignant neoplasm of brain: Secondary | ICD-10-CM | POA: Diagnosis not present

## 2024-03-23 LAB — CMP (CANCER CENTER ONLY)
ALT: 32 U/L (ref 0–44)
AST: 19 U/L (ref 15–41)
Albumin: 4.3 g/dL (ref 3.5–5.0)
Alkaline Phosphatase: 82 U/L (ref 38–126)
Anion gap: 10 (ref 5–15)
BUN: 12 mg/dL (ref 8–23)
CO2: 28 mmol/L (ref 22–32)
Calcium: 9.5 mg/dL (ref 8.9–10.3)
Chloride: 99 mmol/L (ref 98–111)
Creatinine: 0.68 mg/dL (ref 0.44–1.00)
GFR, Estimated: >60 mL/min (ref >60)
Glucose, Bld: 117 mg/dL — ABNORMAL HIGH (ref 70–99)
Potassium: 4 mmol/L (ref 3.5–5.1)
Sodium: 137 mmol/L (ref 135–145)
Total Bilirubin: 0.4 mg/dL (ref 0.0–1.2)
Total Protein: 7.1 g/dL (ref 6.5–8.1)

## 2024-03-23 LAB — CBC WITH DIFFERENTIAL (CANCER CENTER ONLY)
Abs Immature Granulocytes: 0.08 K/uL — ABNORMAL HIGH (ref 0.00–0.07)
Basophils Absolute: 0 K/uL (ref 0.0–0.1)
Basophils Relative: 0 %
Eosinophils Absolute: 0.1 K/uL (ref 0.0–0.5)
Eosinophils Relative: 1 %
HCT: 38.5 % (ref 36.0–46.0)
Hemoglobin: 12.5 g/dL (ref 12.0–15.0)
Immature Granulocytes: 1 %
Lymphocytes Relative: 10 %
Lymphs Abs: 0.8 K/uL (ref 0.7–4.0)
MCH: 30.6 pg (ref 26.0–34.0)
MCHC: 32.5 g/dL (ref 30.0–36.0)
MCV: 94.4 fL (ref 80.0–100.0)
Monocytes Absolute: 0.4 K/uL (ref 0.1–1.0)
Monocytes Relative: 6 %
Neutro Abs: 6.2 K/uL (ref 1.7–7.7)
Neutrophils Relative %: 82 %
Platelet Count: 215 K/uL (ref 150–400)
RBC: 4.08 MIL/uL (ref 3.87–5.11)
RDW: 16.4 % — ABNORMAL HIGH (ref 11.5–15.5)
WBC Count: 7.6 K/uL (ref 4.0–10.5)
nRBC: 0 % (ref 0.0–0.2)

## 2024-03-23 MED ORDER — SODIUM CHLORIDE 0.9 % IV SOLN: Freq: Once | INTRAVENOUS | Status: AC

## 2024-03-23 MED ORDER — SODIUM CHLORIDE 0.9 % IV SOLN: Freq: Once | INTRAVENOUS | Status: DC

## 2024-03-23 MED ORDER — SODIUM CHLORIDE 0.9 % IV SOLN
1200.0000 mg | Freq: Once | INTRAVENOUS | Status: AC
  Administered 2024-03-23: 1200 mg via INTRAVENOUS
  Filled 2024-03-23: qty 20

## 2024-03-23 MED ORDER — SODIUM CHLORIDE 0.9 % IV SOLN: INTRAVENOUS | Status: DC

## 2024-03-23 MED ORDER — SODIUM CHLORIDE 0.9% FLUSH: 10.0000 mL | INTRAVENOUS | Status: DC | PRN

## 2024-03-23 MED ORDER — FLUCONAZOLE 100 MG PO TABS: ORAL_TABLET | ORAL | 0 refills | Status: AC

## 2024-03-23 NOTE — Patient Instructions (Signed)
 Atezolizumab  Injection What is this medication? ATEZOLIZUMAB  (a te zoe LIZ ue mab) treats some types of cancer. It works by helping your immune system slow or stop the spread of cancer cells. It is a monoclonal antibody. This medicine may be used for other purposes; ask your health care provider or pharmacist if you have questions. COMMON BRAND NAME(S): Tecentriq  What should I tell my care team before I take this medication? They need to know if you have any of these conditions: Allogeneic stem cell transplant (uses someone else's stem cells) Autoimmune diseases, such as Crohn disease, ulcerative colitis, lupus History of chest radiation Nervous system problems, such as Guillain-Barre syndrome, myasthenia gravis Organ transplant An unusual or allergic reaction to atezolizumab , other medications, foods, dyes, or preservatives Pregnant or trying to get pregnant Breast-feeding How should I use this medication? This medication is injected into a vein. It is given by your care team in a hospital or clinic setting. A special MedGuide will be given to you before each treatment. Be sure to read this information carefully each time. Talk to your care team about the use of this medication in children. While it may be prescribed for children as young as 2 years for selected conditions, precautions do apply. Overdosage: If you think you have taken too much of this medicine contact a poison control center or emergency room at once. NOTE: This medicine is only for you. Do not share this medicine with others. What if I miss a dose? Keep appointments for follow-up doses. It is important not to miss your dose. Call your care team if you are unable to keep an appointment. What may interact with this medication? Interactions have not been studied. This list may not describe all possible interactions. Give your health care provider a list of all the medicines, herbs, non-prescription drugs, or dietary  supplements you use. Also tell them if you smoke, drink alcohol, or use illegal drugs. Some items may interact with your medicine. What should I watch for while using this medication? Your condition will be monitored carefully while you are receiving this medication. You may need blood work done while you are taking this medication. This medication may cause serious skin reactions. They can happen weeks to months after starting the medication. Contact your care team right away if you notice fevers or flu-like symptoms with a rash. The rash may be red or purple and then turn into blisters or peeling of the skin. You may also notice a red rash with swelling of the face, lips, or lymph nodes in your neck or under your arms. Talk to your care team if you may be pregnant. Serious birth defects can occur if you take this medication during pregnancy and for 5 months after the last dose. You will need a negative pregnancy test before starting this medication. Contraception is recommended while taking this medication and for 5 months after the last dose. Your care team can help you find the option that works for you. Do not breastfeed while taking this medication and for 5 months after the last dose. This medication may cause infertility. Talk to your care team if you are concerned about your fertility. What side effects may I notice from receiving this medication? Side effects that you should report to your doctor or health care professional as soon as possible: Allergic reactions--skin rash, itching, hives, swelling of the face, lips, tongue, or throat Dry cough, shortness of breath or trouble breathing Eye pain, redness, irritation, or discharge  with blurry or decreased vision Heart muscle inflammation--unusual weakness or fatigue, shortness of breath, chest pain, fast or irregular heartbeat, dizziness, swelling of the ankles, feet, or hands Hormone gland problems--headache, sensitivity to light, unusual  weakness or fatigue, dizziness, fast or irregular heartbeat, increased sensitivity to cold or heat, excessive sweating, constipation, hair loss, increased thirst or amount of urine, tremors or shaking, irritability Infusion reactions--chest pain, shortness of breath or trouble breathing, feeling faint or lightheaded Kidney injury (glomerulonephritis)--decrease in the amount of urine, red or dark brown urine, foamy or bubbly urine, swelling of the ankles, hands, or feet Liver injury--right upper belly pain, loss of appetite, nausea, light-colored stool, dark yellow or brown urine, yellowing skin or eyes, unusual weakness or fatigue Pain, tingling, or numbness in the hands or feet, muscle weakness, change in vision, confusion or trouble speaking, loss of balance or coordination, trouble walking, seizures Rash, fever, and swollen lymph nodes Redness, blistering, peeling, or loosening of the skin, including inside the mouth Sudden or severe stomach pain, bloody diarrhea, fever, nausea, vomiting Side effects that usually do not require medical attention (report to your doctor or health care professional if they continue or are bothersome): Bone, joint, or muscle pain Diarrhea Fatigue Loss of appetite Nausea Skin rash This list may not describe all possible side effects. Call your doctor for medical advice about side effects. You may report side effects to FDA at 1-800-FDA-1088. Where should I keep my medication? This medication is given in a hospital or clinic. It will not be stored at home. NOTE: This sheet is a summary. It may not cover all possible information. If you have questions about this medicine, talk to your doctor, pharmacist, or health care provider.  2024 Elsevier/Gold Standard (2023-04-22 00:00:00)

## 2024-03-24 ENCOUNTER — Other Ambulatory Visit: Payer: Self-pay

## 2024-03-25 ENCOUNTER — Telehealth: Payer: Self-pay

## 2024-03-25 NOTE — Telephone Encounter (Signed)
 Pt's cousin called to ask if MRI results are back yet. States EJ keeps asking her over and over.  Dr Ezzard: tell them the scan actually looks better than the CT in July. We can talk to Dr Jomarie to see if they can do any type of stereotactic radiation to any of the lesions that are still there to help with headaches.

## 2024-03-26 NOTE — H&P (Signed)
 ------------------------------------------------------------------------------- Attestation signed by Polly Cordella RAMAN, MD at 03/27/24 [phone] Attending attestation: I saw and evaluated the patient, participating in the key portions of the service.  I reviewed the resident's note.  I agree with the resident's findings and plan.    Cordella RAMAN Polly, MD March 27, 2024 9:03 AM   -------------------------------------------------------------------------------  [facility] HOSPITALIST SERVICE ADMISSION H&P   Dana Roth is a 63 y.o. female with Sepsis due to pneumonia    (CMS-HCC) for acute admission on 03/26/2024.  Assessment/Plan:  Principal Problem:   Sepsis due to pneumonia    (CMS-HCC) Active Problems:   Generalized anxiety disorder   Malignant neoplasm metastatic to brain    (CMS-HCC)   Small cell carcinoma of lung    (CMS-HCC)   COPD (chronic obstructive pulmonary disease)    (CMS-HCC)   Anxiety   Tobacco use disorder   ## Sepsis I CAP I COPD I dehydration Resented with tachycardia, altered mental status, increased respiratory rate with new oxygen requirement.  Chest x-ray notable for right basilar consolidation and bibasilar atelectasis concerning for pneumonia as primary cause.  UA with moderate bacteria though 8 squames and only 8 white blood cells making it more likely to be a contaminant and not true urinary tract infection.  RPP negative.  CBC with leukocytosis to 15.3 otherwise without lactic acidosis on labs.  Given immunocompromise status, vital signs and imaging findings she likely has sepsis secondary to pneumonia.  Additionally carries a diagnosis of COPD though per cousin report does not utilize her inhaler at home. - Start antibiosis with vancomycin, Zosyn, and azithromycin - DuoNebs every 6 hours for airway clearance - Status post 1 L LR in ED, will give additional liter and placed on maintenance. 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  She is currently seen by oncology at [facility] health in [place].  Recently received palliative whole brain radiation.  She is on maintenance atezolizumab .  Unclear prognosis at this time.  Will continue to follow-up.  Postdischarge with oncology team.  Per cousin she is more altered at this time.  Possibly related to brain metastases but could also be secondary to sepsis.  Will continue to monitor throughout course of illness.  #Preseptal cellulitis vs conjunctivitis Erythema crusting over the right eye with conjunctival erythema.  Per cousin's report this appeared this morning.  Possibly in the setting of viral illness preceding pneumonia.  Appropriate treatment with antibiotics as listed above.  Continue to monitor progression and if worsening signs of continued ocular involvement will consider further imaging. - Antibiotics as above - Consider further imaging for strength  ## Anxiety I Depression Significant history of anxiety and depression.  Continuing Home medications - Continue venlafaxine 75 mg ER  - Continue klonopin TID prn  ## Tobacco Use Disorder Smokes 1 pack/day. -NRT  ## Thrush Persistent thrush status post steroids in July 2025 upon diagnosis of brain metastases.  Currently prescribed fluconazole  by outpatient. -Continue fluconazole  100 mg daily  #HTN Noted history of hypertension.  Unclear medication history but no recent fill of hypertensive medications - Monitor vital signs  # FEN/GI: - IVF status post 2 L LR will plan for MIVF - Check electrolytes as indicated, replete as needed. - Diet Regular  #  PPX:  - DVT: SQ Lovenox    Code Status: No Order  Disposition:   Pending assessments  Estimated Date of Discharge (EDD) :   03/28/24     ATTENDING CERTIFICATION: I  certify that, in my clinical judgment, the patient may reasonably be expected to be discharged or transferred to a hospital within 96 hours after admission to [facility]   History of Present Illness:  PCP: [facility] Date of Admission: 03/26/2024 11:07 AM Patient seen in  Emergency Department by hospital provider.  Chief Complaint: Chief Complaint  Patient presents with  . Failure To Thrive     HPI  History of Present Illness Dana Roth is a 63 year old female with a past medical history of small cell lung cancer with brain metastases, COPD, anxiety, depression, COPD, HTN who presents with dehydration and suspected pneumonia. She is accompanied by her cousin, Naomi, who is her medical power of attorney.  She was brought to the hospital by ambulance due to concerns of dehydration. She has been sleeping more than usual and had difficulty getting up and down from bed the previous day. She was unable to eat or drink much, leading to concerns of dehydration.  On the day of admission, she was found sitting on the floor after having pushed her mattress over and almost falling. She had not eaten or drunk much the previous day and was very thirsty upon arrival at the hospital. She had a fever of 100.67F at 2:30 PM and 101.67F later in the evening, for which she was given Tylenol .  She has a history of brain cancer and underwent chemotherapy on March 23, 2024. She has been experiencing severe headaches and double vision. Her mental status has been fluctuating, with episodes of confusion and difficulty holding conversations. She also experienced shaking and weakness, making it difficult for her to stand or walk without assistance.  Her medication regimen includes Klonopin as needed for anxiety, and she was recently prescribed tramadol  for pain. She has a history of using a breathing machine and inhalers for COPD, although she has not used her inhaler in the past two  weeks. She smokes approximately one pack of cigarettes per day and has nicotine  patches at home.  Her right eye appeared red and crusted over on the morning of admission. She has been experiencing nausea and has vomited, although she has not been able to eat or drink much.  She uses a walker for mobility. Carlene is her medical power of attorney and is responsible for managing her healthcare decisions.    ED Course:   Allergies: Codeine  Medications:  Prior to Admission medications  Not on File    Medical History: Past Medical History[1]  Surgical History: Past Surgical History[2]  Social History: Social History[3] Tobacco use:   reports that she has been smoking cigarettes. She has been exposed to tobacco smoke. She has never used smokeless tobacco. Alcohol use:   reports that she does not currently use alcohol. Drug use:  reports current drug use. Drug: Marijuana. Lives arrangements: lives alone. @SDOHALCH @  Family History: The patient's family history is not on file..  10 system ROS negative except as marked above and in HPI.  Objective:  ED Triage Vitals [03/26/24 1114]  Enc Vitals Group     BP 145/61     Pulse 111     SpO2 Pulse      Resp (!) 34     Temp 36.3 C (97.4 F)  Temp src      SpO2 98 %     Weight 88.4 kg (194 lb 14.2 oz)     Height 1.676 m (5' 6)     Head Circumference      Peak Flow      Pain Score      Pain Loc      Pain Education      Exclude from Growth Chart    Temp:  [36.3 C (97.4 F)] 36.3 C (97.4 F) Pulse:  [108-115] 108 Resp:  [24-34] 26 BP: (97-145)/(61-102) 97/63 MAP (mmHg):  [91] 91 SpO2:  [96 %-98 %] 96 % BMI (Calculated):  [31.47] 31.47 No intake/output data recorded. Weight:  Wt Readings from Last 4 Encounters:  03/26/24 88.4 kg (194 lb 14.2 oz)  02/24/23 77.1 kg (170 lb)  11/24/22 81.6 kg (180 lb)    Physical Exam Constitutional:      Comments: Chronically ill-appearing, fatigued  HENT:     Nose: Nose  normal.     Mouth/Throat:     Mouth: Mucous membranes are dry.     Comments: Dry mucous membranes Eyes:     General:        Left eye: No discharge.     Pupils: Pupils are equal, round, and reactive to light.     Comments: Right conjunctiva with erythema.  Right eye with area of surrounding erythema without clearly defined borders and yellowish crusting over the eyelids  Cardiovascular:     Rate and Rhythm: Regular rhythm.     Pulses: Normal pulses.     Comments: Tachycardic Pulmonary:     Comments: Tachypneic, rhonchorous throughout with decreased breath sounds in the lower right lobe Abdominal:     General: There is no distension.     Tenderness: There is no abdominal tenderness.  Skin:    Capillary Refill: Capillary refill takes more than 3 seconds.      Test Results Data Review:   I have reviewed the following labs and studies from the last 24 hours. I have personally interpreted the findings.   Recent Labs    Units 03/26/24 1202  WBC 10*9/L 15.3*  HGB g/dL 87.2  HCT % 62.6  MCV fL 90.9  PLT 10*9/L 196   Recent Labs    Units 03/26/24 1202  NA mmol/L 139  K mmol/L 3.9  CL mmol/L 101  CO2 mmol/L 31.0  BUN mg/dL 16  CREATININE mg/dL 9.09  GLU mg/dL 888*  CALCIUM mg/dL 9.5  MG mg/dL 2.1  ALBUMIN g/dL 4.2  ALT U/L 27  AST U/L 36  ALKPHOS U/L 76  BILITOT mg/dL 0.6  PROT g/dL 7.3   Recent Labs    Units 03/26/24 1205  LACTATE mmol/L 1.7   No results for input(s): INR, PT, APTT in the last 168 hours. No results for input(s): TROPONINI, CKMB, PROBNP in the last 168 hours.  Invalid input(s): CK No results for input(s): O2SOUR, FIO2ART, PHART, PCO2ART, PO2ART, HCO3ART, O2SATART, BEART in the last 168 hours. No results for input(s): PHVEN, PCO2VEN, PO2VEN, HCO3VEN, O2SATVEN, BEVEN in the last 168 hours.  Recent Labs    03/26/24 1426  WBCUA 8*  NITRITE Negative  LEUKOCYTESUR Negative  BACTERIA Moderate*  RBCUA  6*  BLOODU Large*  GLUCOSEU Negative  PROTEINUA 100 mg/dL*  KETONESU Negative   No results for input(s): PREGTESTUR in the last 72 hours. No results for input(s): OPIAU, BENZU, TRICYCLIC, PCPU, AMPHU, COCAU, CANNAU, BARBU, ETOH, ACETAMIN, SALICYLATE in the last 72  hours. Microbiology Results (last day)     Procedure Component Value Date/Time Date/Time   Blood Culture #1 [[id number]] Collected: 03/26/24 1226   Lab Status: In process Specimen: Blood from 1 Peripheral Draw Updated: 03/26/24 1226   Blood Culture #2 [[id number]] Collected: 03/26/24 1201   Lab Status: In process Specimen: Blood from 1 Peripheral Draw Updated: 03/26/24 1226       Pending Labs: Pending Labs     Order Current Status   ED Extra Tubes Collected (03/26/24 1230)   LIGHT BLUE CITRATE EXTRA TUBE Collected (03/26/24 1230)   Blood Culture #1 In process   Blood Culture #2 In process       Imaging: Radiology studies were personally reviewed XR Chest Portable Result Date: 03/26/2024 EXAM: XR CHEST PORTABLE ACCESSION: [id number] [facility] REPORT DATE: 03/26/2024 1:11 PM CLINICAL INDICATION: ALTERED MENTAL STATUS  TECHNIQUE: Single View AP Chest Radiograph. COMPARISON: None FINDINGS: Right chest port catheter with distal tip less than ideally visualized overlying cavoatrial junction. Right basilar consolidation. Bibasilar atelectasis. No pleural effusion or pneumothorax. Normal heart size and mediastinal contours. Chronic deformity of the proximal right humerus.   Right basilar consolidation and bibasilar atelectasis, right greater than left         [1] Past Medical History: Diagnosis Date  . Anxiety   . Depression   . Lung cancer metastatic to brain    (CMS-HCC)   [2] No past surgical history on file. [3] Social History Socioeconomic History  . Marital status: Divorced    Spouse name: None  . Number of children: None  . Years of education: None  . Highest education level: None   Tobacco Use  . Smoking status: Every Day    Current packs/day: 0.50    Types: Cigarettes    Passive exposure: Current  . Smokeless tobacco: Never  Substance and Sexual Activity  . Alcohol use: Not Currently  . Drug use: Yes    Types: Marijuana   Social Drivers of Health   Food Insecurity: Food Insecurity Present (01/20/2024)   Received from [facility]   Hunger Vital Sign   . Within the past 12 months, you worried that your food would run out before you got the money to buy more.: Sometimes true   . Within the past 12 months, the food you bought just didn't last and you didn't have money to get more.: Sometimes true  Transportation Needs: No Transportation Needs (01/20/2024)   Received from [facility] - Transportation   . In the past 12 months, has lack of transportation kept you from medical appointments or from getting medications?: No   . In the past 12 months, has lack of transportation kept you from meetings, work, or from getting things needed for daily living?: No

## 2024-03-26 NOTE — ED Provider Notes (Addendum)
 [facility] EMERGENCY DEPARTMENT ENCOUNTER     CLINICAL IMPRESSION Final diagnoses:  Pneumonia of right lower lobe due to infectious organism (Primary)    Emergency Department Provider Note   Medical Decision Making    Dana Roth is a 63 y.o. female with a past medical history of extensive stage small cell lung cancer which includes spread of disease to her liver, bones and brain who presents with fever and altered mental status.  Differential diagnoses considered on initial evaluation include but are not limited to sepsis, pneumonia, UTI, dehydration, electrolyte abnormality.  On presentation, vitals were notable for blood pressure 126/67, pulse 115, temp 97.4, respiratory rate 29, sat 97% on 3 L. Physical exam revealed a chronically ill-appearing female.  Mucous membranes very dry with thrush present on the tongue.  Lungs with diminished breath sounds.  Heart tachycardic.  Abdomen is soft and nontender..   ED laboratory evaluation notable for an elevated white blood cell count of 15.3.  Chest x-ray shows a right basilar consolidation.  I feel that patient's presentation is most consistent with a right lower lobe pneumonia.  I have initiated treatment with IV ceftriaxone and azithromycin.  I discussed her presentation with Dr. Polly, hospitalist, who accepts patient for admission.  Medical Decision Making     ED Course as of 03/26/24 1438  Sat Mar 26, 2024  1437 WBC(!): 15.3  1437 Absolute Neutrophils(!): 14.5  1438 IMPRESSION: Right basilar consolidation and bibasilar atelectasis, right greater than left       Procedures     Discussion with other professionals: Admitting team - discussed with Dr. Artice, hospitalist, who accepts patient for admission. Independent interpretation: X-ray(s) - chest x-ray shows a right lower lobe infiltrate. I have reviewed recent and relavant previous record, including: I reviewed patient's most recent oncology note dated 03/23/2024.  She  is currently receiving chemotherapy every 3 weeks.     _______________________________________________________________  Time seen: March 26, 2024 11:24 AM  I have reviewed the triage vital signs and the nursing notes.  CHIEF COMPLAINT   Chief Complaint  Patient presents with  . Failure To Thrive     HPI  Dana Roth is a 63 y.o. female with a past medical history of metastatic small cell lung cancer who presents by EMS with history of significant decline over the last 24 hours.  She has not had anything to eat or drink.  She also had a fever as high as 101.2 yesterday.  She has mild altered mental status.  Majority of history is obtained from patient's POA at the bedside.  Patient says that she is not having any pain.  History of Present Illness     PAST MEDICAL HISTORY   Past Medical History[1]  SURGICAL HISTORY   Past Surgical History[2]  CURRENT MEDICATIONS    Current Facility-Administered Medications:  .  azithromycin (ZITHROMAX) 500 mg in sodium chloride  (NS) 0.9 % 250 mL IVPB-vialmate, 500 mg, Intravenous, Once, Hester, Leita Caldron, MD, Last Rate: 250 mL/hr at 03/26/24 1420, 500 mg at 03/26/24 1420 .  lactated ringers  bolus 1,000 mL, 1,000 mL, Intravenous, Once, Elgart, Redell HERO, MD .  piperacillin-tazobactam (ZOSYN) 4.5 g in sodium chloride  0.9 % (NS) 100 mL IVPB-MBP, 4.5 g, Intravenous, Once **AND** piperacillin-tazobactam (ZOSYN) 4.5 g in sodium chloride  0.9 % (NS) 100 mL IVPB-MBP, 4.5 g, Intravenous, Q6H, Elgart, Redell HERO, MD .  vancomycin (VANCOCIN) 2,000 mg in sodium chloride  (NS) 0.9 % 500 mL IVPB, 2,000 mg, Intravenous, Once, Elgart, Redell  M, MD No current outpatient medications on file.  ALLERGIES   Allergies[3]  FAMILY HISTORY   Family History[4]  SOCIAL HISTORY Social History[5]   PHYSICAL EXAM    VITAL SIGNS:   ED Triage Vitals [03/26/24 1114]  Enc Vitals Group     BP 145/61     Pulse 111     SpO2 Pulse      Resp (!) 34     Temp  36.3 C (97.4 F)     Temp src      SpO2 98 %     Weight 88.4 kg (194 lb 14.2 oz)     Height 1.676 m (5' 6)     Head Circumference      Peak Flow      Pain Score      Pain Loc      Pain Education      Exclude from Growth Chart     Constitutional: Appears tired.  Speech is difficult to understand. Eyes: Conjunctivae are normal.  ENT      Head: Normocephalic and atraumatic.      Nose: No congestion.      Mouth/Throat: Mucous membranes are dry.  Thrush on tongue.      Neck: No stridor. Hematological/Lymphatic/Immunilogical: No cervical lymphadenopathy. Cardiovascular: Tachycardic. Respiratory: Tachypneic.  Diminished breath sounds. Gastrointestinal: Soft and nontender. There is no CVA tenderness. Musculoskeletal: No tenderness to palpation in the lower extremities.  No edema. Neurologic: Patient is sleepy but can be awakened.  She does say some appropriate things such as asking to have the head of bed raised.  She moves her arms and legs appropriately.  No obvious facial asymmetry. Skin: Skin is warm, dry and intact. No rash noted. Psychiatric: Unable to assess at this time.   RADIOLOGY   XR Chest Portable Result Date: 03/26/2024 EXAM: XR CHEST PORTABLE ACCESSION: [id number] [facility] REPORT DATE: 03/26/2024 1:11 PM CLINICAL INDICATION: ALTERED MENTAL STATUS  TECHNIQUE: Single View AP Chest Radiograph. COMPARISON: None FINDINGS: Right chest port catheter with distal tip less than ideally visualized overlying cavoatrial junction. Right basilar consolidation. Bibasilar atelectasis. No pleural effusion or pneumothorax. Normal heart size and mediastinal contours. Chronic deformity of the proximal right humerus.   Right basilar consolidation and bibasilar atelectasis, right greater than left    LABS Labs Reviewed  COMPREHENSIVE METABOLIC PANEL - Abnormal; Notable for the following components:      Result Value   Glucose 111 (*)    All other components within normal limits  CBC W/ AUTO DIFF  - Abnormal; Notable for the following components:   WBC 15.3 (*)    RDW 17.1 (*)    Absolute Neutrophils 14.5 (*)    Absolute Lymphocytes 0.2 (*)    All other components within normal limits  LACTATE SEPSIS - Normal  MAGNESIUM - Normal  BLOOD CULTURE  BLOOD CULTURE  RESPIRATORY PATHOGEN PANEL  CBC W/ DIFFERENTIAL   Narrative:    The following orders were created for panel order CBC w/ Differential.                 Procedure                               Abnormality         Status                                    ---------                               -----------         ------  CBC w/ Differential[[id number]]         Abnormal            Final result                                               Please view results for these tests on the individual orders.  URINALYSIS WITH MICROSCOPY  EXTRA TUBES   Narrative:    The following orders were created for panel order ED Extra Tubes.                 Procedure                               Abnormality         Status                                    ---------                               -----------         ------                                    LIGHT BLUE CITRATE EXTR.SABRASABRA[[id number]]                                                                                 Please view results for these tests on the individual orders.  LIGHT BLUE CITRATE EXTRA TUBE       ED COURSE Pertinent labs & imaging results that were available during my care of the patient were reviewed by me (see chart for details).   ATTESTATIONS   Portions of this record have been created using Dragon dictation software. Dictation errors have been sought, but may not have been identified and corrected.    Cristopher Leita Caldron, MD 03/26/24 1444       [1] Past Medical History: Diagnosis Date  . Anxiety   . Depression   . Lung cancer metastatic to brain    (CMS-HCC)   [2] No past surgical history on  file. [3] Allergies Allergen Reactions  . Codeine   [4] History reviewed. No pertinent family history. [5] Social History Socioeconomic History  . Marital status: Divorced    Spouse name: None  . Number of children: None  . Years of education: None  . Highest education level: None  Tobacco Use  . Smoking status: Every Day    Current packs/day: 0.50    Types: Cigarettes    Passive exposure: Current  . Smokeless tobacco: Never  Substance and Sexual Activity  . Alcohol use: Not Currently  . Drug use: Yes    Types: Marijuana   Social Drivers of Health   Food Insecurity: Food Insecurity Present (01/20/2024)  Received from [facility]   Hunger Vital Sign   . Within the past 12 months, you worried that your food would run out before you got the money to buy more.: Sometimes true   . Within the past 12 months, the food you bought just didn't last and you didn't have money to get more.: Sometimes true  Transportation Needs: No Transportation Needs (01/20/2024)   Received from [facility] - Transportation   . In the past 12 months, has lack of transportation kept you from medical appointments or from getting medications?: No   . In the past 12 months, has lack of transportation kept you from meetings, work, or from getting things needed for daily living?: No   Cristopher Leita Caldron, MD 03/26/24 1446

## 2024-03-26 NOTE — ED Notes (Signed)
 Bed: 04 Expected date: 03/26/24 Expected time:  Means of arrival:  Comments: EMS 70f AMS

## 2024-03-26 NOTE — ED Triage Notes (Addendum)
 Patient sent by family via [facility] for AMS and not eating x 3 days. Per EMS, family states that patient is currently a full code and they would like to discuss end of life care when they arrive. Patient has Lung Cancer with metastasis to the brain. Has been undergoing palliative radiation/chemo at [facility] and [facility] to this point.  POA- Naomi Blush [phone] and [phone]  Work of breathing noticeable at time of triage. RR 30-36, Tachy 110-120, Sats on Room air 87%- placed on 3L. Afebrile. Patient lethargic at time of triage.

## 2024-03-27 NOTE — Care Plan (Signed)
 Shift Summary Pain increased overnight and acetaminophen  was administered at the end of shift after initial refusal, with non-pharmacologic comfort measures maintained. Fall prevention interventions were consistently implemented, and no falls or injuries were documented during the shift. Mobility and activity levels remained unchanged, with continued need for moderate assistance and bedrest due to generalized weakness. Antibiotic therapy with piperacillin-tazobactam (ZOSYN) was administered per schedule during the shift. Overall, the patient required ongoing support for comfort, safety, and mobility needs throughout the shift.  Optimal Comfort and Wellbeing: Pain in the head increased from 0 to 3 on the 0-10 scale by the end of shift, with aching, intermittent, and gradual onset reported; acetaminophen  was administered at the end of shift after initial refusal, and non-pharmacologic comfort measures were maintained throughout the night.  Absence of Fall and Fall-Related Injury: Fall prevention strategies such as bed alarms, low bed, and safety devices for transfers were consistently used, and hourly visual checks confirmed the patient remained in bed with no reported falls or injuries during the shift.  Improved Ability to Complete Activities of Daily Living: Mobility and activity remained very limited with bedrest and moderate assistance required for all activities, and generalized weakness persisted throughout the shift.

## 2024-03-28 NOTE — Consults (Signed)
 Vancomycin Therapeutic Monitoring Pharmacy Note  Dana Roth is a 63 y.o. female continuing vancomycin. Date of therapy initiation: 03/26/2024  Indication: Bacteremia/Sepsis and Pneumonia  Prior Dosing Information: Vancomycin IV 2,000 mg IV given 9/06 at 1547, followed by 1,250 mg IV given 9/7 at 1204 (about 21 hours later), and then 1,250mg  IV 9/7 at 2104   Goals: Therapeutic Drug Levels Vancomycin trough goal: 10-15 mg/L  Additional Clinical Monitoring/Outcomes Renal function, volume status (intake and output)  Results: Vancomycin level: 11.9 mg/L, drawn 9/8 at 0911 with dose prior given at 2104 (true trough 12.01 mg/L); Time to infusion based on dose prior was -0.12 hour.  Wt Readings from Last 1 Encounters:  03/26/24 86.9 kg (191 lb 9.3 oz)   Creatinine  Date Value Ref Range Status  03/28/2024 0.70 0.60 - 1.00 mg/dL Final  90/92/7974 9.29 0.60 - 1.00 mg/dL Final  90/93/7974 9.09 0.60 - 1.00 mg/dL Final     Pharmacokinetic Considerations and Significant Drug Interactions: Adult (calculated on 03/28/24): Vd = 64.99 L, ke = 0.0823 hr-1 Concurrent nephrotoxic meds: not applicable  Assessment/Plan: Recommendation(s) Continue current regimen of vancomycin 1,250 mg IV every 12 hours Estimated trough on recommended regimen: 12 mg/L  Follow-up Level due: prior to fourth or fifth dose since doses have not been given on consistent schedule yet. Monitor Cr closely in case vancomycin level is needed earlier.  A pharmacist will continue to monitor and order levels as appropriate  Please page service pharmacist with questions/clarifications.  Corean LITTIE Collet, PharmD

## 2024-03-28 NOTE — Consults (Signed)
 Care Management Initial Transition Planning Assessment            General Care Manager / Social Worker assessed the patient by : In person interview with patient, Medical record review, Discussion with Clinical Care team Orientation Level: Disoriented to situation, Disoriented to time Reason for referral: Discharge Planning  Contact/Decision Maker Extended Emergency Contact Information Primary Emergency Contact: Wilson,Carlene Mobile Phone: [phone] Relation: Relative Secondary Emergency Contact: Ray,Robert Address: unknown Mobile Phone: [phone] Relation: Brother Preferred language: ENGLISH Interpreter needed? No  Legal Next of Kin / Guardian / POA / Advance Directives    Advance Directive (Medical Treatment) Does patient have an advance directive covering medical treatment?: Patient does not have advance directive covering medical treatment. Reason patient does not have an advance directive covering medical treatment:: Patient does not wish to complete one at this time.  Health Care Decision Maker [HCDM] (Medical & Mental Health Treatment) Healthcare Decision Maker: HCDM documented in the HCDM/Contact Info section. Information offered on HCDM, Medical & Mental Health advance directives:: Patient given information.     Readmission Information  Have you been hospitalized in the last 30 days?: No                    Did the following happen with your discharge?                                Patient Information Lives with: Alone  Type of Residence: Private residence     Location/Detail: Patient address [address]  [address]  [place] [address]  Support Systems/Concerns: Family Members  Responsibilities/Dependents at home?: No  Home Care services in place prior to admission?: No           Equipment Currently Used at Home: none    Currently receiving outpatient dialysis?: No    Financial Information    Need for  financial assistance?: No    Social Determinants of Health Social Drivers of Health   Food Insecurity: Food Insecurity Present (01/20/2024)   Received from [facility]   Hunger Vital Sign   . Within the past 12 months, you worried that your food would run out before you got the money to buy more.: Sometimes true   . Within the past 12 months, the food you bought just didn't last and you didn't have money to get more.: Sometimes true  Tobacco Use: High Risk (03/26/2024)   Patient History   . Smoking Tobacco Use: Every Day   . Smokeless Tobacco Use: Never   . Passive Exposure: Current  Transportation Needs: No Transportation Needs (01/20/2024)   Received from [facility] - Transportation   . In the past 12 months, has lack of transportation kept you from medical appointments or from getting medications?: No   . In the past 12 months, has lack of transportation kept you from meetings, work, or from getting things needed for daily living?: No  Alcohol Use: Not on file  Housing: Not on file  Physical Activity: Not on file  Utilities: Not At Risk (01/20/2024)   Received from [facility]   . In the past 12 months has the electric, gas, oil, or water company threatened to shut off services in your home?: No  Stress: Not on file  Interpersonal Safety: Not At Risk (03/27/2024)   Interpersonal Safety   . Unsafe Where You Currently Live: No   .  Physically Hurt by Anyone: No   . Abused by Anyone: No  Substance Use: Not on file (05/27/2023)  Intimate Partner Violence: Not At Risk (01/20/2024)   Received from [facility]   Humiliation, Afraid, Rape, and Kick questionnaire   . Within the last year, have you been afraid of your partner or ex-partner?: No   . Within the last year, have you been humiliated or emotionally abused in other ways by your partner or ex-partner?: No   . Within the last year, have you been kicked, hit, slapped, or otherwise physically hurt by your partner  or ex-partner?: No   . Within the last year, have you been raped or forced to have any kind of sexual activity by your partner or ex-partner?: No  Social Connections: Not on file  Financial Resource Strain: Not on file  Health Literacy: Not on file  Internet Connectivity: Not on file    Complex Discharge Information  Is patient identified as a difficult/complex discharge?: No                                       Interventions:    Discharge Needs Assessment Concerns to be Addressed: discharge planning, home safety, adjustment to diagnosis/illness  Clinical Risk Factors: New Diagnosis, Principal Diagnosis: Cancer, Stroke, COPD, Heart Failure, AMI, Pneumonia, Joint Replacment, Multiple Diagnoses (Chronic), Lives Alone or Absence of Caregiver to Assist with Discharge and Home Care, Palliative Care: Does this patient have an advanced or progressive serious illness?  Barriers to taking medications: No  Prior overnight hospital stay or ED visit in last 90 days: Yes        Anticipated Changes Related to Illness: other (see comments) (TBD)  Equipment Needed After Discharge: other (see comments) (TBD)  Discharge Facility/Level of Care Needs:    Readmission Risk of Unplanned Readmission Score: UNPLANNED READMISSION SCORE: 17.55% Predictive Model Details        18% (Medium)  Factor Value   Calculated 03/28/2024 12:08 22% Diagnosis of drug abuse present   [facility] Risk of Unplanned Readmission Model 18% Number of active inpatient medication orders 24    13% Charlson Comorbidity Index 10    11% Diagnosis of cancer present    10% ECG/EKG order present in last 6 months    7% Imaging order present in last 6 months    6% Number of ED visits in last six months 1    5% Age 18    5% Active anticoagulant inpatient medication order present    2% Current length of stay 1.882 days    Readmitted Within the Last 30 Days? (No if blank)  Patient at risk for readmission?:  Yes  Discharge Plan Screen findings are: Discharge planning needs identified or anticipated (Comment).  Expected Discharge Date: 03/30/2024  Expected Transfer from Critical Care:    Quality data for continuing care services shared with patient and/or representative?: Yes Patient and/or family were provided with choice of facilities / services that are available and appropriate to meet post hospital care needs?: Yes  List choices in order highest to lowest preferred, if applicable. : TBD  Initial Assessment complete?: Yes

## 2024-03-30 ENCOUNTER — Encounter: Payer: Self-pay | Admitting: Oncology

## 2024-03-30 NOTE — Discharge Summary (Signed)
 [facility] Service Physician Discharge Summary   Admit date: 03/26/2024 Discharge date: 03/30/2024  Discharge Service: [facility] Admitting Provider: Cordella GORMAN Idler, MD Discharge Attending Physician: Lynwood Mitchell Rake, MD Discharge to: Home with Home Health and/or PT/OT Consults:  PCP: [facility]  Discharge Diagnoses:  Principal Problem:   Sepsis due to pneumonia    (CMS-HCC) Active Problems:   Generalized anxiety disorder   Malignant neoplasm metastatic to brain    (CMS-HCC)   Small cell carcinoma of lung    (CMS-HCC)   COPD (chronic obstructive pulmonary disease)    (CMS-HCC)   Anxiety   Tobacco use disorder  Outpatient Follow Up - Ensure respiratory status has improved  Hospital Course:   Dana Roth is a 63 year old female with a past medical history of small cell lung cancer with brain metastases, COPD, anxiety, depression,  HTN who presents with pneumonia.   Sepsis secondary to pneumonia I COPD Presented with tachycardia, altered mental status, increased respiratory rate with new oxygen requirement of 2 L nasal cannula.  Chest x-ray concerning for right basilar pneumonia.  Started on vancomycin, Zosyn, and azithromycin for treatment of septic shock secondary to pneumonia and immunocompromise patient.  Additionally was given DuoNebs due to underlying COPD.  Her respiratory status continued to improve and she was weanedto room air 9/9.  Her MRSA screen was negative and therefore she was transitioned to Levaquin for oral antibiosis.She will complete 7 day course 9/7-9/13.   Thrush Persistent thrush status post steroids in July 2025 upon diagnosis of brain metastases.  Currently prescribed fluconazole  by outpatient.  EKG without prolonged QTc in the setting of multiple QTc prolonged medication.  Continued on fluconazole  while inpatient with improvement in thrush to take 2 additional days following discharge.   Procedures:    None  ___________________________________________________________________ Discharge Day Services:   Pt seen on the day of discharge and determined appropriate for discharge.  Day of Discharge Services:  Subjective:  Interval History: No events overnight. Feeling well this morning sitting in the chair. Looking forward to going home.    ROS negative for fever, chills, night sweats, nausea, vomiting, diarrhea, constipation, chest pain, SOB, DOE, Abdominal pain, dysuria, rash, vision changes, headache, unilateral weakness, numbness or tingling  Objective:  Vital signs in last 24 hours: Temp:  [36.7 C (98.1 F)-36.9 C (98.4 F)] 36.9 C (98.4 F) Pulse:  [98-108] 98 Resp:  [16-20] 20 BP: (145-163)/(79-88) 145/88 MAP (mmHg):  [100-104] 104 SpO2:  [89 %-95 %] 93 %  Intake/Output last 3 shifts: I/O last 3 completed shifts: In: 120 [P.O.:120] Out: -   Weight:  Wt Readings from Last 4 Encounters:  03/26/24 86.9 kg (191 lb 9.3 oz)  02/24/23 77.1 kg (170 lb)  11/24/22 81.6 kg (180 lb)    Physical Exam:  General:  No acute distress, cooperative, laying in bed     Cardiovascular: Regular Rate and Rhythm.  S1 and S2 normal  Lungs: Clear to auscultation bilaterally, without                           Wheezes/crackles/rhonchi   Abdomen:  Normoactive bowel sounds, abdomen soft  Extremities:  No bilateral cyanosis, clubbing or edema.  No rash, lesions,  or petechiae.    Condition at Discharge: good  Length of Discharge: I spent greater than 30 mins in the discharge of this patient. ___________________________________________________________________ Discharge Medications:      Your Medication  List     START taking these medications    fluconazole  100 MG tablet Commonly known as: DIFLUCAN  Take 1 tablet (100 mg total) by mouth daily for 2 days. Start taking on: March 31, 2024   levoFLOXacin 750 MG tablet Commonly known as: LEVAQUIN Take 1 tablet (750 mg total) by  mouth daily. Take in afternoon/evening       CONTINUE taking these medications    clonazePAM 1 MG tablet Commonly known as: KlonoPIN Take 1 tablet (1 mg total) by mouth Three (3) times a day as needed for anxiety.   traMADol  50 mg tablet Commonly known as: ULTRAM  Take 1-2 tablets (50-100 mg total) by mouth every six (6) hours as needed for pain.   TRELEGY ELLIPTA  100-62.5-25 mcg inhaler Generic drug: fluticasone-umeclidin-vilanter Inhale 1 puff daily.   venlafaxine 75 MG 24 hr capsule Commonly known as: EFFEXOR-XR Take 1 capsule (75 mg total) by mouth daily.       ___________________________________________________________________ Hospital Tests/Labs:   Pending Test Results (if blank, then none): Pending Labs     Order Current Status   Blood Culture #1 Preliminary result   Blood Culture #2 Preliminary result      Hospital Radiology: CT Chest Wo Contrast Result Date: 03/28/2024 EXAM: CT CHEST WO CONTRAST ACCESSION: [id number] [facility] REPORT DATE: 03/28/2024 8:14 PM CLINICAL INDICATION: worsening hypoxia with pneumonia, lung cancer history TECHNIQUE: Contiguous noncontrast axial images were reconstructed through the chest following a single breath hold helical acquisition. Images were reformatted in the axial. coronal, and sagittal planes. MIP slabs were also constructed. COMPARISON: Chest radiograph 03/26/2024, CT abdomen pelvis 02/24/2023 FINDINGS: LUNGS/AIRWAYS/PLEURA: Trachea and large airways are patent. Confluent centrilobular and paraseptal emphysema. Multifocal areas of groundglass with associated intralobular septal thickening most prominently within the right upper and right lower lobes with areas of consolidation noted within the right lung base with with air bronchograms. There are also scattered areas of groundglass noted within the seen within left upper and lower lobes. Trace left and small right pleural effusions with associated passive atelectasis. MEDIASTINUM/THORACIC  INLET: 1.3 cm lower paratracheal lymph node. 1.0 cm right lower paratracheal lymph node. Ill-defined AP window infiltrative mass which is poorly characterized on this noncontrast study but measures 3.5 x 2.8 cm in the axial dimension (2:70). HEART/VASCULATURE: Cardiac chambers are normal in size. No pericardial effusion. Aorta is normal in caliber. Main pulmonary artery is normal in size. UPPER ABDOMEN: Grossly similar adrenal nodules CHEST WALL/BONES: No enlarged axillary lymph nodes. Sclerotic focus within the left clavicular head. Mild degenerative changes of the thoracic spine. DEVICES: Right internal jugular port catheter tip is at the level of the superior cavoatrial junction.   1. There is minimal residual evidence of the patient's reported metastatic lung cancer. The most relevant findings include ill-defined soft soft tissue and borderline prominent lymph nodes within the mediastinum, notably stations 5 and L4. Recommend correlation with recent prior outside imaging. 2. With regards to possible infectious disease within the thorax, there is a large nonspecific focus of groundglass opacification enveloping intralobar septal thickening (crazy paving) in the upper lobe of the right lung. In this situation, the differential includes infection, edema, and neoplastic disease. 3. Diffuse interlobular septal thickening throughout both lungs likely relates to pulmonary edema. Possibly related, small effusions reside within the dependent aspect of the pleural spaces. ==================== MODIFIED REPORT: (03/28/2024 9:10 PM) This report has been modified from its preliminary version; you may check the prior versions of radiology report, results history link for prior report versions (  if they were previously visible in Epic). -----------------------------------------------   ECG 12 Lead Result Date: 03/27/2024 NORMAL SINUS RHYTHM NORMAL ECG WHEN COMPARED WITH ECG OF 26-Mar-2024 17:15, NO SIGNIFICANT CHANGE WAS  FOUND  ECG 12 Lead Result Date: 03/26/2024 NORMAL SINUS RHYTHM NORMAL ECG WHEN COMPARED WITH ECG OF 24-Feb-2023 21:26, NO SIGNIFICANT CHANGE WAS FOUND  XR Chest Portable Result Date: 03/26/2024 EXAM: XR CHEST PORTABLE ACCESSION: [id number] [facility] REPORT DATE: 03/26/2024 1:11 PM CLINICAL INDICATION: ALTERED MENTAL STATUS  TECHNIQUE: Single View AP Chest Radiograph. COMPARISON: None FINDINGS: Right chest port catheter with distal tip less than ideally visualized overlying cavoatrial junction. Right basilar consolidation. Bibasilar atelectasis. No pleural effusion or pneumothorax. Normal heart size and mediastinal contours. Chronic deformity of the proximal right humerus.   Right basilar consolidation and bibasilar atelectasis, right greater than left    Most Recent Labs: Recent Labs    Units 03/28/24 0910 03/29/24 0940 03/30/24 1020  WBC 10*9/L 8.5 5.1 6.2  HGB g/dL 88.6 89.5* 87.4  HCT % 32.9* 30.3* 36.8  MCV fL 90.3 90.2 89.5  PLT 10*9/L 147* 138* 185   Recent Labs    Units 03/26/24 1202 03/27/24 0841 03/28/24 0911 03/29/24 0940 03/30/24 1020  NA mmol/L 139   < > 142 141 140  K mmol/L 3.9   < > 3.3* 2.9* 3.9  CL mmol/L 101   < > 105 108* 104  CO2 mmol/L 31.0   < > 31.0 28.0 28.0  BUN mg/dL 16   < > 7 3* 7  CREATININE mg/dL 9.09   < > 9.29 9.49* 9.39  GLU mg/dL 888*   < > 97 78 97  CALCIUM mg/dL 9.5   < > 9.0 7.8* 9.5  MG mg/dL 2.1  --   --  1.8  --   ALBUMIN g/dL 4.2   < > 3.4 2.9* 4.0  ALT U/L 27   < > 32 26 33  AST U/L 36   < > 65* 49* 52*  ALKPHOS U/L 76   < > 68 55 69  BILITOT mg/dL 0.6   < > 0.9 0.4 0.7  PROT g/dL 7.3   < > 6.0* 5.4* 7.0   < > = values in this interval not displayed.   Recent Labs    Units 03/26/24 1205  LACTATE mmol/L 1.7   No results for input(s): INR, PT, APTT in the last 168 hours. No results for input(s): TROPONINI, CKMB, PROBNP in the last 168 hours.  Invalid input(s): CK No results for input(s): O2SOUR, FIO2ART, PHART,  PCO2ART, PO2ART, HCO3ART, O2SATART, BEART in the last 168 hours. No results for input(s): PHVEN, PCO2VEN, PO2VEN, HCO3VEN, O2SATVEN, BEVEN in the last 168 hours.  No results for input(s): WBCUA, NITRITE, LEUKOCYTESUR, BACTERIA, RBCUA, BLOODU, GLUCOSEU, PROTEINUA, KETONESU, KETUR in the last 72 hours. No results for input(s): PREGTESTUR in the last 72 hours. No results for input(s): OPIAU, BENZU, TRICYCLIC, PCPU, AMPHU, COCAU, CANNAU, BARBU, ETOH, ACETAMIN, SALICYLATE in the last 72 hours. Microbiology Results (last day)     Procedure Component Value Date/Time Date/Time   Blood Culture #1 [[id number]]  (Normal) Collected: 03/26/24 1201   Lab Status: Preliminary result Specimen: Blood from 1 Peripheral Draw Updated: 03/29/24 1415    Blood Culture, Routine No Growth at 72 hours   Blood Culture #2 [[id number]]  (Normal) Collected: 03/26/24 1411   Lab Status: Preliminary result Specimen: Blood from 1 Peripheral Draw Updated: 03/29/24 1415    Blood Culture, Routine No Growth at 72 hours  ___________________________________________________________________ Discharge Instructions     Other Instructions     Discharge instructions     Please take 2 additional doses of fluconazole  once daily for thrush  You were diagnosed with pneumonia and clinically improved throughout your stay. You have 3 additional days of your antibiotic levaquin to take. Please take around 6pm. If you have any issues with this medicine, please reach out to your medical provider or this hospital.   If after completing antibiotics, you experience worsening cough, shortness of breath or fevers, please be seen at a medical facility.   Discharge instructions to patient: Call your primary care doctor and make an appointment to see them:     Within 2 weeks from the time you are discharged from the hospital      Follow Up instructions and Outpatient Referrals     Ambulatory Referral to Home Health     Reason for referral: therapy recommendations   Physician to follow patient's care: PCP   Disciplines requested:  Physical Therapy Occupational Therapy     Physical Therapy requested:  Home safety evaluation Ambulation training Transfer training Evaluate and treat Strengthening exercises     Occupational Therapy Requested:  Home safety evaluation ADL or IADL training Evaluate and treat Transfer training Strengthening exercises     Requested [facility] Date:  Comment - 2-3 days from discharge   Discharge instructions       ___________________________________________________________________

## 2024-03-30 NOTE — Care Plan (Signed)
 Shift Summary Acetaminophen  was offered and refused during the shift, with temperature remaining stable and no acute changes in infection-related assessments. Fall prevention strategies, including frequent checks and use of safety devices, were maintained without incident. Skin integrity was supported through regular repositioning, use of pillows, and pressure reduction, though petechiae and flushed skin persisted. Moderate assistance was required for mobility and ADLs, with some extremity weakness and probable inadequate nutrition noted. Overall, the shift was stable with ongoing support for infection prevention, fall risk, skin integrity, and mobility needs.  Absence of Infection Signs and Symptoms: Temperature remained within normal range throughout the shift, and cognition and speech were appropriate; no new respiratory findings were documented, though flushed skin and petechiae were noted on assessment. Acetaminophen  was refused during the shift.  Absence of Fall and Fall-Related Injury: Hourly visual checks, safety devices, and fall reduction interventions were consistently maintained, with the room door kept open and moderate assistance provided for mobility; no falls or injuries were documented during the shift.  Skin Health and Integrity: Petechiae and flushed skin were present, and frequent repositioning, use of pillows, and pressure reduction techniques were implemented to support skin integrity; skin remained occasionally moist with potential friction and shear concerns noted.  Improved Ability to Complete Activities of Daily Living: Moderate assistance was required for mobility throughout the shift, with occasional ambulation and up in chair activities; upper and lower extremity movement remained limited in some areas, and nutrition was probably inadequate.

## 2024-04-01 NOTE — Care Plan (Signed)
 Transition of Care Encounter Data   Call attempt: 2 Admission date: 03/26/24 Discharge date: 03/30/24 Discharge diagnosis: Sepsis due to pneumonia Do you have a hospital follow up appointment?: Yes - Within 14 Days, Yes with Primary PCP clinic F/U Date: 04/08/24 F/U Provider: Rosina Amy P.A Patient post discharge: Medications:      SABRA   [facility]: [phone]:  .  [facility]: [phone]:  .  Other: Contact PCP:         [facility] TRANSITIONAL CASE MANAGEMENT SUMMARY NOTE   Attempted to contact patient today at Cell to complete Transitional Case Management call from [employer]. Left message for patient to return call; direct phone number included in message left for patient; 2nd attempt.         DAWNYETTA L TAYLOR, RN

## 2024-04-12 ENCOUNTER — Encounter: Payer: Self-pay | Admitting: Oncology

## 2024-04-12 NOTE — Progress Notes (Unsigned)
 [facility] at [facility] [address] [place],  [place]  [address] [phone]  Clinic Day:  03/23/2024  Referring physician: Rosalea [name], PA   HISTORY OF PRESENT ILLNESS:  The patient is a 63 y.o. 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  She recently completed palliative whole brain radiation.  The patient comes in today to be evaluated before heading into her sixth cycle of maintenance atezolizumab .  Of note, this was preceded by 4 cycles of carboplatin /etoposide /atezolizumab .  The patient still complains of having headaches intermittently.  She also complains of double vision.  Her caregiver also says that she sometimes complains of objects being immediately in her visual field when they are not.  This patient did undergo whole brain radiation recently after a brain MRI taken after 4 cycles of maintenance atezolizumab  immunotherapy showed new brain mets.  PHYSICAL EXAM:  There were no vitals taken for this visit. Wt Readings from Last 3 Encounters:  03/23/24 192 lb 8 oz (87.3 kg)  03/17/24 190 lb (86.2 kg)  02/24/24 187 lb 1.6 oz (84.9 kg)   There is no height or weight on file to calculate BMI. Performance status (ECOG): 1 - Symptomatic but completely ambulatory Physical Exam Constitutional:      Appearance: Normal appearance. She is not ill-appearing.     Comments: An anxious appearing woman who is in a wheelchair.   HENT:     Mouth/Throat:     Mouth: Mucous membranes are moist. Oral lesions (Areas of thrush appreciated) present.     Tongue: Lesions (Persistent thrush) present.     Pharynx: Oropharynx is clear. No oropharyngeal exudate or posterior oropharyngeal erythema.  Cardiovascular:     Rate and Rhythm: Normal rate and regular rhythm.     Heart sounds: No murmur heard.    No friction rub. No gallop.  Pulmonary:     Effort: Pulmonary effort is normal. No respiratory  distress.     Breath sounds: No decreased breath sounds, wheezing, rhonchi or rales.  Abdominal:     General: Bowel sounds are normal. There is no distension.     Palpations: Abdomen is soft. There is no mass.     Tenderness: There is no abdominal tenderness.  Musculoskeletal:        General: No swelling.     Right lower leg: No edema.     Left lower leg: No edema.  Lymphadenopathy:     Cervical: No cervical adenopathy.     Upper Body:     Right upper body: No supraclavicular or axillary adenopathy.     Left upper body: No supraclavicular or axillary adenopathy.     Lower Body: No right inguinal adenopathy. No left inguinal adenopathy.  Skin:    General: Skin is warm.     Coloration: Skin is not jaundiced.     Findings: No lesion or rash.  Neurological:     General: No focal deficit present.     Mental Status: She is alert and oriented to person, place, and time. Mental status is at baseline.  Psychiatric:        Mood and Affect: Mood normal.        Behavior: Behavior normal.        Thought Content: Thought content normal.    LABS:      Latest Ref Rng & Units 03/23/2024    9:34 AM 03/17/2024    7:35 PM  03/17/2024    2:01 PM  CBC  WBC 4.0 - 10.5 K/uL 7.6  6.5  5.9   Hemoglobin 12.0 - 15.0 g/dL 87.4  87.3  87.3   Hematocrit 36.0 - 46.0 % 38.5  41.0  39.0   Platelets 150 - 400 K/uL 215  201  185       Latest Ref Rng & Units 03/23/2024    9:34 AM 03/17/2024    7:35 PM 03/17/2024    2:01 PM  CMP  Glucose 70 - 99 mg/dL 882  88  91   BUN 8 - 23 mg/dL 12  12  14    Creatinine 0.44 - 1.00 mg/dL 9.31  9.35  9.34   Sodium 135 - 145 mmol/L 137  138  140   Potassium 3.5 - 5.1 mmol/L 4.0  4.1  3.8   Chloride 98 - 111 mmol/L 99  102  101   CO2 22 - 32 mmol/L 28  26  27    Calcium 8.9 - 10.3 mg/dL 9.5  9.4  9.8   Total Protein 6.5 - 8.1 g/dL 7.1  6.8  7.1   Total Bilirubin 0.0 - 1.2 mg/dL 0.4  0.4  0.3   Alkaline Phos 38 - 126 U/L 82  78  80   AST 15 - 41 U/L 19  22  18    ALT 0 - 44  U/L 32  33  32    ASSESSMENT & PLAN:  A 63 y.o. female with extensive stage small cell lung cancer, including spread of disease to her brain, liver and bones.  The patient will proceed with her 6th cycle of maintenance atezolizumab  today.  I remain very concerned with her CNS symptoms, which suggest her metastatic brain disease may have not completely responded to her recent whole brain radiation.  Based upon this, I will arrange for her to undergo a brain MRI tomorrow.  Of note, the patient was scheduled to undergo a brain MRI in [place] last week, but was unable to get it done due to apparently poor communication issues she had with the staffs there.   I will notify the patient of her brain MRI images as soon as they become available.  Her physical exam today was also pertinent for persistent thrush.  Based upon this, she will take a repeat course of fluconazole  over this next week.  She understands her thrush likely developed from the prolonged course of steroids she received to minimize the brain swelling from her CNS metastasis.  As she complains of possible dehydration, I will arrange for her to receive 1.5 L of normal saline today.  Otherwise, I will see this patient back in 3 weeks before she heads into her 7th cycle of maintenance atezolizumab  immunotherapy.  That visit may change depending on what her brain MRI images reveal.  The patient understands all the plans discussed today and is in agreement with them.  Hideo Googe DELENA Kerns, MD

## 2024-04-13 ENCOUNTER — Inpatient Hospital Stay

## 2024-04-13 ENCOUNTER — Inpatient Hospital Stay (HOSPITAL_BASED_OUTPATIENT_CLINIC_OR_DEPARTMENT_OTHER): Admitting: Oncology

## 2024-04-13 ENCOUNTER — Other Ambulatory Visit: Payer: Self-pay

## 2024-04-13 VITALS — HR 93

## 2024-04-13 VITALS — BP 115/83 | HR 104 | Temp 98.1°F | Resp 16 | Ht 64.0 in

## 2024-04-13 DIAGNOSIS — C3412 Malignant neoplasm of upper lobe, left bronchus or lung: Secondary | ICD-10-CM | POA: Diagnosis not present

## 2024-04-13 DIAGNOSIS — Z5112 Encounter for antineoplastic immunotherapy: Secondary | ICD-10-CM | POA: Diagnosis not present

## 2024-04-13 LAB — CBC WITH DIFFERENTIAL (CANCER CENTER ONLY)
Abs Immature Granulocytes: 0.02 K/uL (ref 0.00–0.07)
Basophils Absolute: 0 K/uL (ref 0.0–0.1)
Basophils Relative: 1 %
Eosinophils Absolute: 0.2 K/uL (ref 0.0–0.5)
Eosinophils Relative: 3 %
HCT: 45.3 % (ref 36.0–46.0)
Hemoglobin: 14.4 g/dL (ref 12.0–15.0)
Immature Granulocytes: 0 %
Lymphocytes Relative: 11 %
Lymphs Abs: 0.8 K/uL (ref 0.7–4.0)
MCH: 30.3 pg (ref 26.0–34.0)
MCHC: 31.8 g/dL (ref 30.0–36.0)
MCV: 95.4 fL (ref 80.0–100.0)
Monocytes Absolute: 0.3 K/uL (ref 0.1–1.0)
Monocytes Relative: 5 %
Neutro Abs: 5.3 K/uL (ref 1.7–7.7)
Neutrophils Relative %: 80 %
Platelet Count: 166 K/uL (ref 150–400)
RBC: 4.75 MIL/uL (ref 3.87–5.11)
RDW: 14.4 % (ref 11.5–15.5)
WBC Count: 6.6 K/uL (ref 4.0–10.5)
nRBC: 0 % (ref 0.0–0.2)

## 2024-04-13 LAB — CMP (CANCER CENTER ONLY)
ALT: 56 U/L — ABNORMAL HIGH (ref 0–44)
AST: 33 U/L (ref 15–41)
Albumin: 4.2 g/dL (ref 3.5–5.0)
Alkaline Phosphatase: 80 U/L (ref 38–126)
Anion gap: 13 (ref 5–15)
BUN: 8 mg/dL (ref 8–23)
CO2: 25 mmol/L (ref 22–32)
Calcium: 10.1 mg/dL (ref 8.9–10.3)
Chloride: 103 mmol/L (ref 98–111)
Creatinine: 0.78 mg/dL (ref 0.44–1.00)
GFR, Estimated: >60 mL/min (ref >60)
Glucose, Bld: 116 mg/dL — ABNORMAL HIGH (ref 70–99)
Potassium: 4 mmol/L (ref 3.5–5.1)
Sodium: 140 mmol/L (ref 135–145)
Total Bilirubin: 0.5 mg/dL (ref 0.0–1.2)
Total Protein: 7.3 g/dL (ref 6.5–8.1)

## 2024-04-13 LAB — TSH: TSH: 0.93 u[IU]/mL (ref 0.350–4.500)

## 2024-04-13 MED ORDER — SODIUM CHLORIDE 0.9 % IV SOLN: INTRAVENOUS | Status: DC

## 2024-04-13 MED ORDER — SODIUM CHLORIDE 0.9 % IV SOLN
1200.0000 mg | Freq: Once | INTRAVENOUS | Status: AC
  Administered 2024-04-13: 1200 mg via INTRAVENOUS
  Filled 2024-04-13: qty 20

## 2024-04-13 NOTE — Patient Instructions (Signed)
 [facility] [place] - A DEPT OF [facility]  Discharge Instructions: Thank you for choosing [facility] to provide your oncology and hematology care.  If you have a lab appointment with the Cancer Center, please go directly to the Cancer Center and check in at the registration area.   Wear comfortable clothing and clothing appropriate for easy access to any Portacath or PICC line.   We strive to give you quality time with your provider. You may need to reschedule your appointment if you arrive late (15 or more minutes).  Arriving late affects you and other patients whose appointments are after yours.  Also, if you miss three or more appointments without notifying the office, you may be dismissed from the clinic at the provider's discretion.      For prescription refill requests, have your pharmacy contact our office and allow 72 hours for refills to be completed.    Today you received the following chemotherapy and/or immunotherapy agents Atezolizumab      To help prevent nausea and vomiting after your treatment, we encourage you to take your nausea medication as directed.  BELOW ARE SYMPTOMS THAT SHOULD BE REPORTED IMMEDIATELY: *FEVER GREATER THAN 100.4 F (38 C) OR HIGHER *CHILLS OR SWEATING *NAUSEA AND VOMITING THAT IS NOT CONTROLLED WITH YOUR NAUSEA MEDICATION *UNUSUAL SHORTNESS OF BREATH *UNUSUAL BRUISING OR BLEEDING *URINARY PROBLEMS (pain or burning when urinating, or frequent urination) *BOWEL PROBLEMS (unusual diarrhea, constipation, pain near the anus) TENDERNESS IN MOUTH AND THROAT WITH OR WITHOUT PRESENCE OF ULCERS (sore throat, sores in mouth, or a toothache) UNUSUAL RASH, SWELLING OR PAIN  UNUSUAL VAGINAL DISCHARGE OR ITCHING   Items with * indicate a potential emergency and should be followed up as soon as possible or go to the Emergency Department if any problems should occur.  Please show the CHEMOTHERAPY ALERT CARD or IMMUNOTHERAPY  ALERT CARD at check-in to the Emergency Department and triage nurse.  Should you have questions after your visit or need to cancel or reschedule your appointment, please contact [facility] [place] - A DEPT OF [facility]  Dept: [phone]  and follow the prompts.  Office hours are 8:00 a.m. to 4:30 p.m. Monday - Friday. Please note that voicemails left after 4:00 p.m. may not be returned until the following business day.  We are closed weekends and major holidays. You have access to a nurse at all times for urgent questions. Please call the main number to the clinic Dept: [phone] and follow the prompts.  For any non-urgent questions, you may also contact your provider using MyChart. We now offer e-Visits for anyone 67 and older to request care online for non-urgent symptoms. For details visit [email or web address].   Also download the MyChart app! Go to the app store, search "MyChart", open the app, select [place], and log in with your MyChart username and password.

## 2024-04-14 ENCOUNTER — Other Ambulatory Visit: Payer: Self-pay

## 2024-04-15 LAB — T4: T4, Total: 6.7 ug/dL (ref 4.5–12.0)

## 2024-05-01 ENCOUNTER — Other Ambulatory Visit: Payer: Self-pay

## 2024-05-02 ENCOUNTER — Telehealth: Payer: Self-pay | Admitting: Acute Care

## 2024-05-02 MED ORDER — TRELEGY ELLIPTA 100-62.5-25 MCG/ACT IN AEPB: 1.0000 | INHALATION_SPRAY | Freq: Every day | RESPIRATORY_TRACT | Status: AC

## 2024-05-02 NOTE — Telephone Encounter (Signed)
 Per admin staff, the pt's daughter called inquiring about Trelegy 100 samples  Per chart review, the daughter is not on DPR  I reviewed past phone encounters and it looks like she has received samples of this and PAP forms as well  Also, per last ov note, pt was supposed to be taking spiriva  and symbicort   I called the pt to discuss meds and there was no answer- LMTCB

## 2024-05-02 NOTE — Telephone Encounter (Signed)
 Pt's cousin and healthcare POA (on [place]) came to ask about samples  She states pt has remained on trelegy since she was here last  She says that the symbicort  and spiriva  were not effective so she wanted to remain on trelegy  She is unsure about the PAP forms that were given to the pt last time samples were picked up  She says that the pt is undergoing tx for metastatic brain mets and can not remember things recently  I advised will provide 1 more trelegy sample, and I have also provided her with more PAP forms  She will bring completed forms back to office and we will fax to [employer]  Nothing further needed at this time

## 2024-05-03 ENCOUNTER — Telehealth: Payer: Self-pay

## 2024-05-03 NOTE — Telephone Encounter (Signed)
 Copied from CRM [id number]. Topic: General - Other >> Apr 29, 2024  1:36 PM Rilla B wrote: Reason for CRM: Patient uses Trilegy inhaler. She cannot afford the inhaler so the office always has one for her at the front desk.  Calling to see if she can pick up a sample inhaler as she is out of this mediation.  Called [facility]. Please give you [phone]

## 2024-05-03 NOTE — Progress Notes (Unsigned)
 [facility] at [facility] [address] [place],  [place]  [address] [phone]  Clinic Day:  04/13/2024  Referring physician: Rosalea [name], PA   HISTORY OF PRESENT ILLNESS:  The patient is a 63 y.o. 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  She recently completed palliative whole brain radiation.  The patient comes in today to be evaluated before heading into her 8th cycle of maintenance atezolizumab .  Of note, this was preceded by 4 cycles of carboplatin /etoposide /atezolizumab .  The patient still complains of having headaches intermittently, but are not as prominent as they were previously.   Of note, her brain MRI done at her last visit showed a significant reduction in the metastatic CNS disease burden from her small cell lung cancer since she completed her whole brain radiation.  PHYSICAL EXAM:  There were no vitals taken for this visit. Wt Readings from Last 3 Encounters:  03/23/24 192 lb 8 oz (87.3 kg)  03/17/24 190 lb (86.2 kg)  02/24/24 187 lb 1.6 oz (84.9 kg)   There is no height or weight on file to calculate BMI. Performance status (ECOG): 1 - Symptomatic but completely ambulatory Physical Exam Constitutional:      Appearance: Normal appearance. She is not ill-appearing.     Comments: A chronically ill-appearing woman in a wheelchair.  She does appear mildly confused.  She does smell of marijuana smoke.   HENT:     Mouth/Throat:     Mouth: Mucous membranes are moist. Oral lesions: Areas of thrush appreciated.     Tongue: No lesions (Persistent thrush).     Pharynx: Oropharynx is clear. No oropharyngeal exudate or posterior oropharyngeal erythema.  Cardiovascular:     Rate and Rhythm: Normal rate and regular rhythm.     Heart sounds: No murmur heard.    No friction rub. No gallop.  Pulmonary:     Effort: Pulmonary effort is normal. No respiratory distress.     Breath sounds:  No decreased breath sounds, wheezing, rhonchi or rales.  Abdominal:     General: Bowel sounds are normal. There is no distension.     Palpations: Abdomen is soft. There is no mass.     Tenderness: There is no abdominal tenderness.  Musculoskeletal:        General: No swelling.     Right lower leg: No edema.     Left lower leg: No edema.  Lymphadenopathy:     Cervical: No cervical adenopathy.     Upper Body:     Right upper body: No supraclavicular or axillary adenopathy.     Left upper body: No supraclavicular or axillary adenopathy.     Lower Body: No right inguinal adenopathy. No left inguinal adenopathy.  Skin:    General: Skin is warm.     Coloration: Skin is not jaundiced.     Findings: No lesion or rash.  Neurological:     General: No focal deficit present.     Mental Status: She is alert and oriented to person, place, and time. Mental status is at baseline.  Psychiatric:        Mood and Affect: Mood normal.        Behavior: Behavior normal.        Thought Content: Thought content normal.    LABS:      Latest Ref Rng & Units 04/13/2024   10:15 AM 03/23/2024    9:34 AM  03/17/2024    7:35 PM  CBC  WBC 4.0 - 10.5 K/uL 6.6  7.6  6.5   Hemoglobin 12.0 - 15.0 g/dL 85.5  87.4  87.3   Hematocrit 36.0 - 46.0 % 45.3  38.5  41.0   Platelets 150 - 400 K/uL 166  215  201       Latest Ref Rng & Units 04/13/2024   10:15 AM 03/23/2024    9:34 AM 03/17/2024    7:35 PM  CMP  Glucose 70 - 99 mg/dL 883  882  88   BUN 8 - 23 mg/dL 8  12  12    Creatinine 0.44 - 1.00 mg/dL 9.21  9.31  9.35   Sodium 135 - 145 mmol/L 140  137  138   Potassium 3.5 - 5.1 mmol/L 4.0  4.0  4.1   Chloride 98 - 111 mmol/L 103  99  102   CO2 22 - 32 mmol/L 25  28  26    Calcium 8.9 - 10.3 mg/dL 89.8  9.5  9.4   Total Protein 6.5 - 8.1 g/dL 7.3  7.1  6.8   Total Bilirubin 0.0 - 1.2 mg/dL 0.5  0.4  0.4   Alkaline Phos 38 - 126 U/L 80  82  78   AST 15 - 41 U/L 33  19  22   ALT 0 - 44 U/L 56  32  33     ASSESSMENT & PLAN:  A 63 y.o. female with extensive stage small cell lung cancer, including spread of disease to her brain, liver and bones.  The patient will proceed with her 7th cycle of maintenance atezolizumab  today.  The patient is slightly more confused today than what she was previously.  However, she did smoke marijuana before she came to clinic today, which part of her confusion could be attributed.  Otherwise, I will see this patient back in 3 weeks before she heads into her 8th cycle of maintenance atezolizumab  immunotherapy.  The patient understands all the plans discussed today and is in agreement with them.  Sacoya Mcgourty DELENA Kerns, MD

## 2024-05-04 ENCOUNTER — Inpatient Hospital Stay: Attending: Oncology

## 2024-05-04 ENCOUNTER — Inpatient Hospital Stay

## 2024-05-04 ENCOUNTER — Telehealth: Payer: Self-pay | Admitting: Oncology

## 2024-05-04 ENCOUNTER — Encounter: Payer: Self-pay | Admitting: Oncology

## 2024-05-04 ENCOUNTER — Inpatient Hospital Stay (HOSPITAL_BASED_OUTPATIENT_CLINIC_OR_DEPARTMENT_OTHER): Admitting: Oncology

## 2024-05-04 VITALS — BP 120/79 | HR 92 | Temp 98.3°F | Resp 14 | Ht 64.0 in | Wt 176.8 lb

## 2024-05-04 DIAGNOSIS — C3412 Malignant neoplasm of upper lobe, left bronchus or lung: Secondary | ICD-10-CM

## 2024-05-04 DIAGNOSIS — C3492 Malignant neoplasm of unspecified part of left bronchus or lung: Secondary | ICD-10-CM | POA: Diagnosis not present

## 2024-05-04 DIAGNOSIS — Z79620 Long term (current) use of immunosuppressive biologic: Secondary | ICD-10-CM | POA: Insufficient documentation

## 2024-05-04 DIAGNOSIS — C7931 Secondary malignant neoplasm of brain: Secondary | ICD-10-CM | POA: Diagnosis not present

## 2024-05-04 DIAGNOSIS — Z5112 Encounter for antineoplastic immunotherapy: Secondary | ICD-10-CM | POA: Diagnosis present

## 2024-05-04 DIAGNOSIS — C787 Secondary malignant neoplasm of liver and intrahepatic bile duct: Secondary | ICD-10-CM | POA: Diagnosis not present

## 2024-05-04 DIAGNOSIS — C7951 Secondary malignant neoplasm of bone: Secondary | ICD-10-CM | POA: Diagnosis not present

## 2024-05-04 LAB — CMP (CANCER CENTER ONLY)
ALT: 53 U/L — ABNORMAL HIGH (ref 0–44)
AST: 44 U/L — ABNORMAL HIGH (ref 15–41)
Albumin: 4.3 g/dL (ref 3.5–5.0)
Alkaline Phosphatase: 77 U/L (ref 38–126)
Anion gap: 11 (ref 5–15)
BUN: 6 mg/dL — ABNORMAL LOW (ref 8–23)
CO2: 26 mmol/L (ref 22–32)
Calcium: 10.1 mg/dL (ref 8.9–10.3)
Chloride: 104 mmol/L (ref 98–111)
Creatinine: 0.63 mg/dL (ref 0.44–1.00)
GFR, Estimated: >60 mL/min (ref >60)
Glucose, Bld: 94 mg/dL (ref 70–99)
Potassium: 4 mmol/L (ref 3.5–5.1)
Sodium: 141 mmol/L (ref 135–145)
Total Bilirubin: 0.4 mg/dL (ref 0.0–1.2)
Total Protein: 7 g/dL (ref 6.5–8.1)

## 2024-05-04 LAB — CBC WITH DIFFERENTIAL (CANCER CENTER ONLY)
Abs Immature Granulocytes: 0.02 K/uL (ref 0.00–0.07)
Basophils Absolute: 0 K/uL (ref 0.0–0.1)
Basophils Relative: 0 %
Eosinophils Absolute: 0.2 K/uL (ref 0.0–0.5)
Eosinophils Relative: 3 %
HCT: 43.1 % (ref 36.0–46.0)
Hemoglobin: 13.8 g/dL (ref 12.0–15.0)
Immature Granulocytes: 0 %
Lymphocytes Relative: 17 %
Lymphs Abs: 0.8 K/uL (ref 0.7–4.0)
MCH: 29.7 pg (ref 26.0–34.0)
MCHC: 32 g/dL (ref 30.0–36.0)
MCV: 92.7 fL (ref 80.0–100.0)
Monocytes Absolute: 0.3 K/uL (ref 0.1–1.0)
Monocytes Relative: 6 %
Neutro Abs: 3.7 K/uL (ref 1.7–7.7)
Neutrophils Relative %: 74 %
Platelet Count: 161 K/uL (ref 150–400)
RBC: 4.65 MIL/uL (ref 3.87–5.11)
RDW: 13.3 % (ref 11.5–15.5)
WBC Count: 5 K/uL (ref 4.0–10.5)
nRBC: 0 % (ref 0.0–0.2)

## 2024-05-04 MED ORDER — SODIUM CHLORIDE 0.9 % IV SOLN: INTRAVENOUS | Status: DC

## 2024-05-04 MED ORDER — SODIUM CHLORIDE 0.9 % IV SOLN
1200.0000 mg | Freq: Once | INTRAVENOUS | Status: AC
  Administered 2024-05-04: 1200 mg via INTRAVENOUS
  Filled 2024-05-04: qty 20

## 2024-05-04 NOTE — Telephone Encounter (Signed)
 Patient has been scheduled for follow-up visit per 05/04/24 LOS.  Pt given an appt calendar with date and time.

## 2024-05-04 NOTE — Patient Instructions (Signed)
 Atezolizumab  Injection What is this medication? ATEZOLIZUMAB  (a te zoe LIZ ue mab) treats some types of cancer. It works by helping your immune system slow or stop the spread of cancer cells. It is a monoclonal antibody. This medicine may be used for other purposes; ask your health care provider or pharmacist if you have questions. COMMON BRAND NAME(S): Tecentriq  What should I tell my care team before I take this medication? They need to know if you have any of these conditions: Allogeneic stem cell transplant (uses someone else's stem cells) Autoimmune diseases, such as Crohn disease, ulcerative colitis, lupus History of chest radiation Nervous system problems, such as Guillain-Barre syndrome, myasthenia gravis Organ transplant An unusual or allergic reaction to atezolizumab , other medications, foods, dyes, or preservatives Pregnant or trying to get pregnant Breast-feeding How should I use this medication? This medication is injected into a vein. It is given by your care team in a hospital or clinic setting. A special MedGuide will be given to you before each treatment. Be sure to read this information carefully each time. Talk to your care team about the use of this medication in children. While it may be prescribed for children as young as 2 years for selected conditions, precautions do apply. Overdosage: If you think you have taken too much of this medicine contact a poison control center or emergency room at once. NOTE: This medicine is only for you. Do not share this medicine with others. What if I miss a dose? Keep appointments for follow-up doses. It is important not to miss your dose. Call your care team if you are unable to keep an appointment. What may interact with this medication? Interactions have not been studied. This list may not describe all possible interactions. Give your health care provider a list of all the medicines, herbs, non-prescription drugs, or dietary  supplements you use. Also tell them if you smoke, drink alcohol, or use illegal drugs. Some items may interact with your medicine. What should I watch for while using this medication? Your condition will be monitored carefully while you are receiving this medication. You may need blood work done while you are taking this medication. This medication may cause serious skin reactions. They can happen weeks to months after starting the medication. Contact your care team right away if you notice fevers or flu-like symptoms with a rash. The rash may be red or purple and then turn into blisters or peeling of the skin. You may also notice a red rash with swelling of the face, lips, or lymph nodes in your neck or under your arms. Talk to your care team if you may be pregnant. Serious birth defects can occur if you take this medication during pregnancy and for 5 months after the last dose. You will need a negative pregnancy test before starting this medication. Contraception is recommended while taking this medication and for 5 months after the last dose. Your care team can help you find the option that works for you. Do not breastfeed while taking this medication and for 5 months after the last dose. This medication may cause infertility. Talk to your care team if you are concerned about your fertility. What side effects may I notice from receiving this medication? Side effects that you should report to your doctor or health care professional as soon as possible: Allergic reactions--skin rash, itching, hives, swelling of the face, lips, tongue, or throat Dry cough, shortness of breath or trouble breathing Eye pain, redness, irritation, or discharge  with blurry or decreased vision Heart muscle inflammation--unusual weakness or fatigue, shortness of breath, chest pain, fast or irregular heartbeat, dizziness, swelling of the ankles, feet, or hands Hormone gland problems--headache, sensitivity to light, unusual  weakness or fatigue, dizziness, fast or irregular heartbeat, increased sensitivity to cold or heat, excessive sweating, constipation, hair loss, increased thirst or amount of urine, tremors or shaking, irritability Infusion reactions--chest pain, shortness of breath or trouble breathing, feeling faint or lightheaded Kidney injury (glomerulonephritis)--decrease in the amount of urine, red or dark brown urine, foamy or bubbly urine, swelling of the ankles, hands, or feet Liver injury--right upper belly pain, loss of appetite, nausea, light-colored stool, dark yellow or brown urine, yellowing skin or eyes, unusual weakness or fatigue Pain, tingling, or numbness in the hands or feet, muscle weakness, change in vision, confusion or trouble speaking, loss of balance or coordination, trouble walking, seizures Rash, fever, and swollen lymph nodes Redness, blistering, peeling, or loosening of the skin, including inside the mouth Sudden or severe stomach pain, bloody diarrhea, fever, nausea, vomiting Side effects that usually do not require medical attention (report to your doctor or health care professional if they continue or are bothersome): Bone, joint, or muscle pain Diarrhea Fatigue Loss of appetite Nausea Skin rash This list may not describe all possible side effects. Call your doctor for medical advice about side effects. You may report side effects to FDA at 1-800-FDA-1088. Where should I keep my medication? This medication is given in a hospital or clinic. It will not be stored at home. NOTE: This sheet is a summary. It may not cover all possible information. If you have questions about this medicine, talk to your doctor, pharmacist, or health care provider.  2024 Elsevier/Gold Standard (2023-04-22 00:00:00)[facility] [place] - A DEPT OF [facility]  Discharge Instructions: Thank you for choosing [facility] to provide your oncology and hematology care.  If  you have a lab appointment with the Cancer Center, please go directly to the Cancer Center and check in at the registration area.   Wear comfortable clothing and clothing appropriate for easy access to any Portacath or PICC line.   We strive to give you quality time with your provider. You may need to reschedule your appointment if you arrive late (15 or more minutes).  Arriving late affects you and other patients whose appointments are after yours.  Also, if you miss three or more appointments without notifying the office, you may be dismissed from the clinic at the provider's discretion.      For prescription refill requests, have your pharmacy contact our office and allow 72 hours for refills to be completed.    Today you received the following chemotherapy and/or immunotherapy agents atezolizumab       To help prevent nausea and vomiting after your treatment, we encourage you to take your nausea medication as directed.  BELOW ARE SYMPTOMS THAT SHOULD BE REPORTED IMMEDIATELY: *FEVER GREATER THAN 100.4 F (38 C) OR HIGHER *CHILLS OR SWEATING *NAUSEA AND VOMITING THAT IS NOT CONTROLLED WITH YOUR NAUSEA MEDICATION *UNUSUAL SHORTNESS OF BREATH *UNUSUAL BRUISING OR BLEEDING *URINARY PROBLEMS (pain or burning when urinating, or frequent urination) *BOWEL PROBLEMS (unusual diarrhea, constipation, pain near the anus) TENDERNESS IN MOUTH AND THROAT WITH OR WITHOUT PRESENCE OF ULCERS (sore throat, sores in mouth, or a toothache) UNUSUAL RASH, SWELLING OR PAIN  UNUSUAL VAGINAL DISCHARGE OR ITCHING   Items with * indicate a potential emergency and should be followed  up as soon as possible or go to the Emergency Department if any problems should occur.  Please show the CHEMOTHERAPY ALERT CARD or IMMUNOTHERAPY ALERT CARD at check-in to the Emergency Department and triage nurse.  Should you have questions after your visit or need to cancel or reschedule your appointment, please contact [facility]  [place] - [facility]  Dept: [phone]  and follow the prompts.  Office hours are 8:00 a.m. to 4:30 p.m. Monday - Friday. Please note that voicemails left after 4:00 p.m. may not be returned until the following business day.  We are closed weekends and major holidays. You have access to a nurse at all times for urgent questions. Please call the main number to the clinic Dept: [phone] and follow the prompts.  For any non-urgent questions, you may also contact your provider using MyChart. We now offer e-Visits for anyone 97 and older to request care online for non-urgent symptoms. For details visit [email or web address].   Also download the MyChart app! Go to the app store, search MyChart, open the app, select [place], and log in with your MyChart username and password.

## 2024-05-05 ENCOUNTER — Other Ambulatory Visit: Payer: Self-pay

## 2024-05-06 ENCOUNTER — Other Ambulatory Visit: Payer: Self-pay | Admitting: Hematology and Oncology

## 2024-05-06 DIAGNOSIS — M5126 Other intervertebral disc displacement, lumbar region: Secondary | ICD-10-CM

## 2024-05-06 DIAGNOSIS — M159 Polyosteoarthritis, unspecified: Secondary | ICD-10-CM

## 2024-05-06 DIAGNOSIS — R519 Headache, unspecified: Secondary | ICD-10-CM

## 2024-05-06 DIAGNOSIS — M79604 Pain in right leg: Secondary | ICD-10-CM

## 2024-05-06 DIAGNOSIS — G8929 Other chronic pain: Secondary | ICD-10-CM

## 2024-05-20 ENCOUNTER — Other Ambulatory Visit: Payer: Self-pay

## 2024-05-24 NOTE — Progress Notes (Unsigned)
 [facility] at [facility] [address] [place],  [place]  [address] [phone]  Clinic Day:  05/25/2024  Referring physician: Rosalea [name], PA   HISTORY OF PRESENT ILLNESS:  The patient is a 63 y.o. 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  She has completed palliative whole brain radiation.  The patient comes in today to be evaluated before heading into her 9th cycle of maintenance atezolizumab .  Of note, this was preceded by 4 cycles of carboplatin /etoposide /atezolizumab .  The patient claims to have minimal headaches at this time, which has improved since undergoing whole brain radiation.  She also denies having altered mentation or vision changes which concern her for worsening CNS metastasis.  Of note, recent scans showed no evidence of metastatic disease outside of her brain.  PHYSICAL EXAM:  Blood pressure 131/86, pulse 93, temperature 98.2 F (36.8 C), temperature source Oral, resp. rate 16, height 5' 4 (1.626 m), weight 172 lb 6.4 oz (78.2 kg), SpO2 99%. Wt Readings from Last 3 Encounters:  05/25/24 172 lb 6.4 oz (78.2 kg)  05/04/24 176 lb 12.8 oz (80.2 kg)  03/23/24 192 lb 8 oz (87.3 kg)   Body mass index is 29.59 kg/m. Performance status (ECOG): 1 - Symptomatic but completely ambulatory Physical Exam Constitutional:      Appearance: Normal appearance. She is not ill-appearing.     Comments: A chronically ill-appearing woman in a wheelchair who physically looks much better versus previous visits.   HENT:     Mouth/Throat:     Mouth: Mucous membranes are moist. No oral lesions.     Tongue: No lesions.     Pharynx: Oropharynx is clear. No oropharyngeal exudate or posterior oropharyngeal erythema.  Cardiovascular:     Rate and Rhythm: Normal rate and regular rhythm.     Heart sounds: No murmur heard.    No friction rub. No gallop.  Pulmonary:     Effort: Pulmonary effort is  normal. No respiratory distress.     Breath sounds: No decreased breath sounds, wheezing, rhonchi or rales.  Abdominal:     General: Bowel sounds are normal. There is no distension.     Palpations: Abdomen is soft. There is no mass.     Tenderness: There is no abdominal tenderness.  Musculoskeletal:        General: No swelling.     Right lower leg: No edema.     Left lower leg: No edema.  Lymphadenopathy:     Cervical: No cervical adenopathy.     Upper Body:     Right upper body: No supraclavicular or axillary adenopathy.     Left upper body: No supraclavicular or axillary adenopathy.     Lower Body: No right inguinal adenopathy. No left inguinal adenopathy.  Skin:    General: Skin is warm.     Coloration: Skin is not jaundiced.     Findings: No lesion or rash.  Neurological:     General: No focal deficit present.     Mental Status: She is alert and oriented to person, place, and time. Mental status is at baseline.  Psychiatric:        Mood and Affect: Mood normal.        Behavior: Behavior normal.        Thought Content: Thought content normal.    LABS:      Latest Ref Rng & Units 05/25/2024   10:59  AM 05/04/2024   10:22 AM 04/13/2024   10:15 AM  CBC  WBC 4.0 - 10.5 K/uL 5.4  5.0  6.6   Hemoglobin 12.0 - 15.0 g/dL 84.8  86.1  85.5   Hematocrit 36.0 - 46.0 % 46.8  43.1  45.3   Platelets 150 - 400 K/uL 174  161  166       Latest Ref Rng & Units 05/25/2024   10:59 AM 05/04/2024   10:22 AM 04/13/2024   10:15 AM  CMP  Glucose 70 - 99 mg/dL 99  94  883   BUN 8 - 23 mg/dL 5  6  8    Creatinine 0.44 - 1.00 mg/dL 9.38  9.36  9.21   Sodium 135 - 145 mmol/L 141  141  140   Potassium 3.5 - 5.1 mmol/L 4.1  4.0  4.0   Chloride 98 - 111 mmol/L 105  104  103   CO2 22 - 32 mmol/L 25  26  25    Calcium 8.9 - 10.3 mg/dL 89.9  89.8  89.8   Total Protein 6.5 - 8.1 g/dL 7.2  7.0  7.3   Total Bilirubin 0.0 - 1.2 mg/dL 0.4  0.4  0.5   Alkaline Phos 38 - 126 U/L 89  77  80   AST 15 - 41  U/L 36  44  33   ALT 0 - 44 U/L 43  53  56    ASSESSMENT & PLAN:  A 63 y.o. female with extensive stage small cell lung cancer, including spread of disease to her brain, liver and bones.  The patient will proceed with her 9th cycle of maintenance atezolizumab  today.  Clinically, the patient looks much better.  I will see this patient back in 3 weeks before she heads into her 10th cycle of maintenance atezolizumab  immunotherapy.  The patient understands all the plans discussed today and is in agreement with them.  Dlisa Barnwell DELENA Kerns, MD

## 2024-05-25 ENCOUNTER — Inpatient Hospital Stay: Admitting: Oncology

## 2024-05-25 ENCOUNTER — Inpatient Hospital Stay: Attending: Oncology

## 2024-05-25 ENCOUNTER — Inpatient Hospital Stay

## 2024-05-25 ENCOUNTER — Telehealth: Payer: Self-pay | Admitting: Oncology

## 2024-05-25 ENCOUNTER — Encounter: Payer: Self-pay | Admitting: Oncology

## 2024-05-25 VITALS — BP 131/86 | HR 93 | Temp 98.2°F | Resp 16 | Ht 64.0 in | Wt 172.4 lb

## 2024-05-25 DIAGNOSIS — C3412 Malignant neoplasm of upper lobe, left bronchus or lung: Secondary | ICD-10-CM

## 2024-05-25 LAB — CBC WITH DIFFERENTIAL (CANCER CENTER ONLY)
Abs Immature Granulocytes: 0.01 K/uL (ref 0.00–0.07)
Basophils Absolute: 0 K/uL (ref 0.0–0.1)
Basophils Relative: 0 %
Eosinophils Absolute: 0.2 K/uL (ref 0.0–0.5)
Eosinophils Relative: 3 %
HCT: 46.8 % — ABNORMAL HIGH (ref 36.0–46.0)
Hemoglobin: 15.1 g/dL — ABNORMAL HIGH (ref 12.0–15.0)
Immature Granulocytes: 0 %
Lymphocytes Relative: 14 %
Lymphs Abs: 0.8 K/uL (ref 0.7–4.0)
MCH: 29.2 pg (ref 26.0–34.0)
MCHC: 32.3 g/dL (ref 30.0–36.0)
MCV: 90.5 fL (ref 80.0–100.0)
Monocytes Absolute: 0.3 K/uL (ref 0.1–1.0)
Monocytes Relative: 6 %
Neutro Abs: 4.1 K/uL (ref 1.7–7.7)
Neutrophils Relative %: 77 %
Platelet Count: 174 K/uL (ref 150–400)
RBC: 5.17 MIL/uL — ABNORMAL HIGH (ref 3.87–5.11)
RDW: 13.1 % (ref 11.5–15.5)
WBC Count: 5.4 K/uL (ref 4.0–10.5)
nRBC: 0 % (ref 0.0–0.2)

## 2024-05-25 LAB — CMP (CANCER CENTER ONLY)
ALT: 43 U/L (ref 0–44)
AST: 36 U/L (ref 15–41)
Albumin: 4.4 g/dL (ref 3.5–5.0)
Alkaline Phosphatase: 89 U/L (ref 38–126)
Anion gap: 11 (ref 5–15)
BUN: <5 mg/dL — ABNORMAL LOW (ref 8–23)
CO2: 25 mmol/L (ref 22–32)
Calcium: 10 mg/dL (ref 8.9–10.3)
Chloride: 105 mmol/L (ref 98–111)
Creatinine: 0.61 mg/dL (ref 0.44–1.00)
GFR, Estimated: >60 mL/min (ref >60)
Glucose, Bld: 99 mg/dL (ref 70–99)
Potassium: 4.1 mmol/L (ref 3.5–5.1)
Sodium: 141 mmol/L (ref 135–145)
Total Bilirubin: 0.4 mg/dL (ref 0.0–1.2)
Total Protein: 7.2 g/dL (ref 6.5–8.1)

## 2024-05-25 MED ORDER — SODIUM CHLORIDE 0.9 % IV SOLN: INTRAVENOUS | Status: AC

## 2024-05-25 MED ORDER — SODIUM CHLORIDE 0.9 % IV SOLN
1200.0000 mg | Freq: Once | INTRAVENOUS | Status: AC
  Administered 2024-05-25: 1200 mg via INTRAVENOUS
  Filled 2024-05-25: qty 20

## 2024-05-25 NOTE — Telephone Encounter (Signed)
 Patient has been scheduled for follow-up visit per 08/05/23 LOS.  Pt given an appt calendar with date and time.

## 2024-05-26 ENCOUNTER — Encounter: Payer: Self-pay | Admitting: Oncology

## 2024-05-26 ENCOUNTER — Other Ambulatory Visit: Payer: Self-pay

## 2024-06-14 NOTE — Progress Notes (Unsigned)
 [facility] at [facility] [address] [place],  [place]  [address] [phone]  Clinic Day:  05/25/2024  Referring physician: Rosalea [name], PA   HISTORY OF PRESENT ILLNESS:  The patient is a 63 y.o. 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  She has completed palliative whole brain radiation.  The patient comes in today to be evaluated before heading into her 10th cycle of maintenance atezolizumab .  Of note, this was preceded by 4 cycles of carboplatin /etoposide /atezolizumab .  The patient claims to have minimal headaches at this time, which has improved since undergoing whole brain radiation.  She also denies having altered mentation or vision changes which concern her for worsening CNS metastasis.  Of note, recent scans showed no evidence of metastatic disease outside of her brain.  PHYSICAL EXAM:  There were no vitals taken for this visit. Wt Readings from Last 3 Encounters:  05/25/24 172 lb 6.4 oz (78.2 kg)  05/04/24 176 lb 12.8 oz (80.2 kg)  03/23/24 192 lb 8 oz (87.3 kg)   There is no height or weight on file to calculate BMI. Performance status (ECOG): 1 - Symptomatic but completely ambulatory Physical Exam Constitutional:      Appearance: Normal appearance. She is not ill-appearing.     Comments: A chronically ill-appearing woman in a wheelchair who physically looks much better versus previous visits.   HENT:     Mouth/Throat:     Mouth: Mucous membranes are moist. No oral lesions.     Tongue: No lesions.     Pharynx: Oropharynx is clear. No oropharyngeal exudate or posterior oropharyngeal erythema.  Cardiovascular:     Rate and Rhythm: Normal rate and regular rhythm.     Heart sounds: No murmur heard.    No friction rub. No gallop.  Pulmonary:     Effort: Pulmonary effort is normal. No respiratory distress.     Breath sounds: No decreased breath sounds, wheezing, rhonchi or  rales.  Abdominal:     General: Bowel sounds are normal. There is no distension.     Palpations: Abdomen is soft. There is no mass.     Tenderness: There is no abdominal tenderness.  Musculoskeletal:        General: No swelling.     Right lower leg: No edema.     Left lower leg: No edema.  Lymphadenopathy:     Cervical: No cervical adenopathy.     Upper Body:     Right upper body: No supraclavicular or axillary adenopathy.     Left upper body: No supraclavicular or axillary adenopathy.     Lower Body: No right inguinal adenopathy. No left inguinal adenopathy.  Skin:    General: Skin is warm.     Coloration: Skin is not jaundiced.     Findings: No lesion or rash.  Neurological:     General: No focal deficit present.     Mental Status: She is alert and oriented to person, place, and time. Mental status is at baseline.  Psychiatric:        Mood and Affect: Mood normal.        Behavior: Behavior normal.        Thought Content: Thought content normal.    LABS:      Latest Ref Rng & Units 05/25/2024   10:59 AM 05/04/2024   10:22 AM 04/13/2024   10:15 AM  CBC  WBC 4.0 -  10.5 K/uL 5.4  5.0  6.6   Hemoglobin 12.0 - 15.0 g/dL 84.8  86.1  85.5   Hematocrit 36.0 - 46.0 % 46.8  43.1  45.3   Platelets 150 - 400 K/uL 174  161  166       Latest Ref Rng & Units 05/25/2024   10:59 AM 05/04/2024   10:22 AM 04/13/2024   10:15 AM  CMP  Glucose 70 - 99 mg/dL 99  94  883   BUN 8 - 23 mg/dL 5  6  8    Creatinine 0.44 - 1.00 mg/dL 9.38  9.36  9.21   Sodium 135 - 145 mmol/L 141  141  140   Potassium 3.5 - 5.1 mmol/L 4.1  4.0  4.0   Chloride 98 - 111 mmol/L 105  104  103   CO2 22 - 32 mmol/L 25  26  25    Calcium 8.9 - 10.3 mg/dL 89.9  89.8  89.8   Total Protein 6.5 - 8.1 g/dL 7.2  7.0  7.3   Total Bilirubin 0.0 - 1.2 mg/dL 0.4  0.4  0.5   Alkaline Phos 38 - 126 U/L 89  77  80   AST 15 - 41 U/L 36  44  33   ALT 0 - 44 U/L 43  53  56    ASSESSMENT & PLAN:  A 63 y.o. female with extensive  stage small cell lung cancer, including spread of disease to her brain, liver and bones.  The patient will proceed with her 9th cycle of maintenance atezolizumab  today.  Clinically, the patient looks much better.  I will see this patient back in 3 weeks before she heads into her 10th cycle of maintenance atezolizumab  immunotherapy.  The patient understands all the plans discussed today and is in agreement with them.  [name] DELENA Kerns, MD

## 2024-06-15 ENCOUNTER — Other Ambulatory Visit: Payer: Self-pay | Admitting: Oncology

## 2024-06-15 ENCOUNTER — Telehealth: Payer: Self-pay | Admitting: Oncology

## 2024-06-15 ENCOUNTER — Inpatient Hospital Stay

## 2024-06-15 ENCOUNTER — Other Ambulatory Visit: Payer: Self-pay

## 2024-06-15 ENCOUNTER — Inpatient Hospital Stay (HOSPITAL_BASED_OUTPATIENT_CLINIC_OR_DEPARTMENT_OTHER): Admitting: Oncology

## 2024-06-15 ENCOUNTER — Encounter: Payer: Self-pay | Admitting: Oncology

## 2024-06-15 VITALS — BP 112/67 | HR 89 | Temp 98.2°F | Resp 16 | Ht 64.0 in | Wt 165.3 lb

## 2024-06-15 DIAGNOSIS — Z452 Encounter for adjustment and management of vascular access device: Secondary | ICD-10-CM

## 2024-06-15 DIAGNOSIS — C3412 Malignant neoplasm of upper lobe, left bronchus or lung: Secondary | ICD-10-CM

## 2024-06-15 LAB — CBC WITH DIFFERENTIAL (CANCER CENTER ONLY)
Abs Immature Granulocytes: 0.01 K/uL (ref 0.00–0.07)
Basophils Absolute: 0 K/uL (ref 0.0–0.1)
Basophils Relative: 0 %
Eosinophils Absolute: 0.2 K/uL (ref 0.0–0.5)
Eosinophils Relative: 4 %
HCT: 47.6 % — ABNORMAL HIGH (ref 36.0–46.0)
Hemoglobin: 15.2 g/dL — ABNORMAL HIGH (ref 12.0–15.0)
Immature Granulocytes: 0 %
Lymphocytes Relative: 17 %
Lymphs Abs: 0.9 K/uL (ref 0.7–4.0)
MCH: 29.1 pg (ref 26.0–34.0)
MCHC: 31.9 g/dL (ref 30.0–36.0)
MCV: 91.2 fL (ref 80.0–100.0)
Monocytes Absolute: 0.3 K/uL (ref 0.1–1.0)
Monocytes Relative: 6 %
Neutro Abs: 3.8 K/uL (ref 1.7–7.7)
Neutrophils Relative %: 73 %
Platelet Count: 168 K/uL (ref 150–400)
RBC: 5.22 MIL/uL — ABNORMAL HIGH (ref 3.87–5.11)
RDW: 13.1 % (ref 11.5–15.5)
WBC Count: 5.3 K/uL (ref 4.0–10.5)
nRBC: 0 % (ref 0.0–0.2)

## 2024-06-15 LAB — CMP (CANCER CENTER ONLY)
ALT: 31 U/L (ref 0–44)
AST: 30 U/L (ref 15–41)
Albumin: 4.2 g/dL (ref 3.5–5.0)
Alkaline Phosphatase: 96 U/L (ref 38–126)
Anion gap: 12 (ref 5–15)
BUN: 6 mg/dL — ABNORMAL LOW (ref 8–23)
CO2: 25 mmol/L (ref 22–32)
Calcium: 10.2 mg/dL (ref 8.9–10.3)
Chloride: 103 mmol/L (ref 98–111)
Creatinine: 0.69 mg/dL (ref 0.44–1.00)
GFR, Estimated: >60 mL/min (ref >60)
Glucose, Bld: 93 mg/dL (ref 70–99)
Potassium: 4.3 mmol/L (ref 3.5–5.1)
Sodium: 140 mmol/L (ref 135–145)
Total Bilirubin: 0.3 mg/dL (ref 0.0–1.2)
Total Protein: 7 g/dL (ref 6.5–8.1)

## 2024-06-15 MED ORDER — SODIUM CHLORIDE 0.9 % IV SOLN
1200.0000 mg | Freq: Once | INTRAVENOUS | Status: AC
  Administered 2024-06-15: 1200 mg via INTRAVENOUS
  Filled 2024-06-15: qty 20

## 2024-06-15 MED ORDER — ZOLEDRONIC ACID 4 MG/100ML IV SOLN
4.0000 mg | Freq: Once | INTRAVENOUS | Status: AC
  Administered 2024-06-15: 4 mg via INTRAVENOUS
  Filled 2024-06-15: qty 100

## 2024-06-15 MED ORDER — SODIUM CHLORIDE 0.9 % IV SOLN: INTRAVENOUS | Status: DC

## 2024-06-15 MED ORDER — ALTEPLASE 2 MG IJ SOLR
2.0000 mg | Freq: Once | INTRAMUSCULAR | Status: AC
  Administered 2024-06-15: 2 mg
  Filled 2024-06-15: qty 2

## 2024-06-15 NOTE — Telephone Encounter (Signed)
 MRI Brain has been scheduled for 07/04/24; Checking in @ 10:15 am at [facility].  CT C/A/P has been scheduled for 07/05/24 @ 10:30 am ; Check in at 10 am at [address] [facility]  Notified pt of date,time, instructions and location.

## 2024-06-15 NOTE — Patient Instructions (Signed)
 8 INFORMED THE PATIENT TO TAKE CALCIUM 600MG  TABLETS (2) TABLETS DAILY FOR TOTAL 1200MG  WITH VITAMIN D . PATIENT VERBALIZED UNDERSTANDING. DENTAL CARE EXPLAINED TO PATIENT ALSO, BUT PATIENT HAS NO TEETH. Zoledronic  Acid Injection (Cancer) What is this medication? ZOLEDRONIC  ACID (ZOE le dron ik AS id) treats high calcium levels in the blood caused by cancer. It may also be used with chemotherapy to treat weakened bones caused by cancer. It works by slowing down the release of calcium from bones. This lowers calcium levels in your blood. It also makes your bones stronger and less likely to break (fracture). It belongs to a group of medications called bisphosphonates. This medicine may be used for other purposes; ask your health care provider or pharmacist if you have questions. COMMON BRAND NAME(S): Zometa , Zometa  Powder What should I tell my care team before I take this medication? They need to know if you have any of these conditions: Dehydration Dental disease Kidney disease Liver disease Low levels of calcium in the blood Lung or breathing disease, such as asthma Receiving steroids, such as dexamethasone  or prednisone  An unusual or allergic reaction to zoledronic  acid, other medications, foods, dyes, or preservatives Pregnant or trying to get pregnant Breast-feeding How should I use this medication? This medication is injected into a vein. It is given by your care team in a hospital or clinic setting. Talk to your care team about the use of this medication in children. Special care may be needed. Overdosage: If you think you have taken too much of this medicine contact a poison control center or emergency room at once. NOTE: This medicine is only for you. Do not share this medicine with others. What if I miss a dose? Keep appointments for follow-up doses. It is important not to miss your dose. Call your care team if you are unable to keep an appointment. What may interact with this  medication? Certain antibiotics given by injection Diuretics, such as bumetanide, furosemide NSAIDs, medications for pain and inflammation, such as ibuprofen or naproxen  Teriparatide Thalidomide This list may not describe all possible interactions. Give your health care provider a list of all the medicines, herbs, non-prescription drugs, or dietary supplements you use. Also tell them if you smoke, drink alcohol, or use illegal drugs. Some items may interact with your medicine. What should I watch for while using this medication? Visit your care team for regular checks on your progress. It may be some time before you see the benefit from this medication. Some people who take this medication have severe bone, joint, or muscle pain. This medication may also increase your risk for jaw problems or a broken thigh bone. Tell your care team right away if you have severe pain in your jaw, bones, joints, or muscles. Tell you care team if you have any pain that does not go away or that gets worse. Tell your dentist and dental surgeon that you are taking this medication. You should not have major dental surgery while on this medication. See your dentist to have a dental exam and fix any dental problems before starting this medication. Take good care of your teeth while on this medication. Make sure you see your dentist for regular follow-up appointments. You should make sure you get enough calcium and vitamin D  while you are taking this medication. Discuss the foods you eat and the vitamins you take with your care team. Check with your care team if you have severe diarrhea, nausea, and vomiting, or if you sweat a  lot. The loss of too much body fluid may make it dangerous for you to take this medication. You may need bloodwork while taking this medication. Talk to your care team if you wish to become pregnant or think you might be pregnant. This medication can cause serious birth defects. What side effects may I  notice from receiving this medication? Side effects that you should report to your care team as soon as possible: Allergic reactions--skin rash, itching, hives, swelling of the face, lips, tongue, or throat Kidney injury--decrease in the amount of urine, swelling of the ankles, hands, or feet Low calcium level--muscle pain or cramps, confusion, tingling, or numbness in the hands or feet Osteonecrosis of the jaw--pain, swelling, or redness in the mouth, numbness of the jaw, poor healing after dental work, unusual discharge from the mouth, visible bones in the mouth Severe bone, joint, or muscle pain Side effects that usually do not require medical attention (report to your care team if they continue or are bothersome): Constipation Fatigue Fever Loss of appetite Nausea Stomach pain This list may not describe all possible side effects. Call your doctor for medical advice about side effects. You may report side effects to FDA at 1-800-FDA-1088. Where should I keep my medication? This medication is given in a hospital or clinic. It will not be stored at home. NOTE: This sheet is a summary. It may not cover all possible information. If you have questions about this medicine, talk to your doctor, pharmacist, or health care provider.  2024 Elsevier/Gold Standard (2021-08-30 00:00:00)Atezolizumab  Injection What is this medication? ATEZOLIZUMAB  (a te zoe LIZ ue mab) treats some types of cancer. It works by helping your immune system slow or stop the spread of cancer cells. It is a monoclonal antibody. This medicine may be used for other purposes; ask your health care provider or pharmacist if you have questions. COMMON BRAND NAME(S): Tecentriq  What should I tell my care team before I take this medication? They need to know if you have any of these conditions: Allogeneic stem cell transplant (uses someone else's stem cells) Autoimmune diseases, such as Crohn disease, ulcerative colitis,  lupus History of chest radiation Nervous system problems, such as Guillain-Barre syndrome, myasthenia gravis Organ transplant An unusual or allergic reaction to atezolizumab , other medications, foods, dyes, or preservatives Pregnant or trying to get pregnant Breast-feeding How should I use this medication? This medication is injected into a vein. It is given by your care team in a hospital or clinic setting. A special MedGuide will be given to you before each treatment. Be sure to read this information carefully each time. Talk to your care team about the use of this medication in children. While it may be prescribed for children as young as 2 years for selected conditions, precautions do apply. Overdosage: If you think you have taken too much of this medicine contact a poison control center or emergency room at once. NOTE: This medicine is only for you. Do not share this medicine with others. What if I miss a dose? Keep appointments for follow-up doses. It is important not to miss your dose. Call your care team if you are unable to keep an appointment. What may interact with this medication? Interactions have not been studied. This list may not describe all possible interactions. Give your health care provider a list of all the medicines, herbs, non-prescription drugs, or dietary supplements you use. Also tell them if you smoke, drink alcohol, or use illegal drugs. Some items may interact  with your medicine. What should I watch for while using this medication? Your condition will be monitored carefully while you are receiving this medication. You may need blood work done while you are taking this medication. This medication may cause serious skin reactions. They can happen weeks to months after starting the medication. Contact your care team right away if you notice fevers or flu-like symptoms with a rash. The rash may be red or purple and then turn into blisters or peeling of the skin. You may  also notice a red rash with swelling of the face, lips, or lymph nodes in your neck or under your arms. Talk to your care team if you may be pregnant. Serious birth defects can occur if you take this medication during pregnancy and for 5 months after the last dose. You will need a negative pregnancy test before starting this medication. Contraception is recommended while taking this medication and for 5 months after the last dose. Your care team can help you find the option that works for you. Do not breastfeed while taking this medication and for 5 months after the last dose. This medication may cause infertility. Talk to your care team if you are concerned about your fertility. What side effects may I notice from receiving this medication? Side effects that you should report to your doctor or health care professional as soon as possible: Allergic reactions--skin rash, itching, hives, swelling of the face, lips, tongue, or throat Dry cough, shortness of breath or trouble breathing Eye pain, redness, irritation, or discharge with blurry or decreased vision Heart muscle inflammation--unusual weakness or fatigue, shortness of breath, chest pain, fast or irregular heartbeat, dizziness, swelling of the ankles, feet, or hands Hormone gland problems--headache, sensitivity to light, unusual weakness or fatigue, dizziness, fast or irregular heartbeat, increased sensitivity to cold or heat, excessive sweating, constipation, hair loss, increased thirst or amount of urine, tremors or shaking, irritability Infusion reactions--chest pain, shortness of breath or trouble breathing, feeling faint or lightheaded Kidney injury (glomerulonephritis)--decrease in the amount of urine, red or dark brown urine, foamy or bubbly urine, swelling of the ankles, hands, or feet Liver injury--right upper belly pain, loss of appetite, nausea, light-colored stool, dark yellow or brown urine, yellowing skin or eyes, unusual weakness or  fatigue Pain, tingling, or numbness in the hands or feet, muscle weakness, change in vision, confusion or trouble speaking, loss of balance or coordination, trouble walking, seizures Rash, fever, and swollen lymph nodes Redness, blistering, peeling, or loosening of the skin, including inside the mouth Sudden or severe stomach pain, bloody diarrhea, fever, nausea, vomiting Side effects that usually do not require medical attention (report to your doctor or health care professional if they continue or are bothersome): Bone, joint, or muscle pain Diarrhea Fatigue Loss of appetite Nausea Skin rash This list may not describe all possible side effects. Call your doctor for medical advice about side effects. You may report side effects to FDA at 1-800-FDA-1088. Where should I keep my medication? This medication is given in a hospital or clinic. It will not be stored at home. NOTE: This sheet is a summary. It may not cover all possible information. If you have questions about this medicine, talk to your doctor, pharmacist, or health care provider.  2024 Elsevier/Gold Standard (2023-04-22 00:00:00)[facility] [place] - A DEPT OF [facility]  Discharge Instructions: Thank you for choosing [facility] to provide your oncology and hematology care.  If you have a  lab appointment with the Cancer Center, please go directly to the Cancer Center and check in at the registration area.   Wear comfortable clothing and clothing appropriate for easy access to any Portacath or PICC line.   We strive to give you quality time with your provider. You may need to reschedule your appointment if you arrive late (15 or more minutes).  Arriving late affects you and other patients whose appointments are after yours.  Also, if you miss three or more appointments without notifying the office, you may be dismissed from the clinic at the provider's discretion.      For prescription refill  requests, have your pharmacy contact our office and allow 72 hours for refills to be completed.    Today you received the following chemotherapy and/or immunotherapy agents atezolizumab       To help prevent nausea and vomiting after your treatment, we encourage you to take your nausea medication as directed.  BELOW ARE SYMPTOMS THAT SHOULD BE REPORTED IMMEDIATELY: *FEVER GREATER THAN 100.4 F (38 C) OR HIGHER *CHILLS OR SWEATING *NAUSEA AND VOMITING THAT IS NOT CONTROLLED WITH YOUR NAUSEA MEDICATION *UNUSUAL SHORTNESS OF BREATH *UNUSUAL BRUISING OR BLEEDING *URINARY PROBLEMS (pain or burning when urinating, or frequent urination) *BOWEL PROBLEMS (unusual diarrhea, constipation, pain near the anus) TENDERNESS IN MOUTH AND THROAT WITH OR WITHOUT PRESENCE OF ULCERS (sore throat, sores in mouth, or a toothache) UNUSUAL RASH, SWELLING OR PAIN  UNUSUAL VAGINAL DISCHARGE OR ITCHING   Items with * indicate a potential emergency and should be followed up as soon as possible or go to the Emergency Department if any problems should occur.  Please show the CHEMOTHERAPY ALERT CARD or IMMUNOTHERAPY ALERT CARD at check-in to the Emergency Department and triage nurse.  Should you have questions after your visit or need to cancel or reschedule your appointment, please contact [facility] [place] - A DEPT OF [facility]  Dept: [phone]  and follow the prompts.  Office hours are 8:00 a.m. to 4:30 p.m. Monday - Friday. Please note that voicemails left after 4:00 p.m. may not be returned until the following business day.  We are closed weekends and major holidays. You have access to a nurse at all times for urgent questions. Please call the main number to the clinic Dept: [phone] and follow the prompts.  For any non-urgent questions, you may also contact your provider using MyChart. We now offer e-Visits for anyone 74 and older to request care online for non-urgent symptoms. For  details visit [email or web address].   Also download the MyChart app! Go to the app store, search MyChart, open the app, select [place], and log in with your MyChart username and password.

## 2024-06-28 ENCOUNTER — Other Ambulatory Visit: Payer: Self-pay | Admitting: Hematology and Oncology

## 2024-06-28 DIAGNOSIS — M159 Polyosteoarthritis, unspecified: Secondary | ICD-10-CM

## 2024-06-28 DIAGNOSIS — G8929 Other chronic pain: Secondary | ICD-10-CM

## 2024-07-04 ENCOUNTER — Inpatient Hospital Stay: Attending: Oncology

## 2024-07-04 DIAGNOSIS — C787 Secondary malignant neoplasm of liver and intrahepatic bile duct: Secondary | ICD-10-CM | POA: Insufficient documentation

## 2024-07-04 DIAGNOSIS — Z79620 Long term (current) use of immunosuppressive biologic: Secondary | ICD-10-CM | POA: Diagnosis not present

## 2024-07-04 DIAGNOSIS — Z5112 Encounter for antineoplastic immunotherapy: Secondary | ICD-10-CM | POA: Insufficient documentation

## 2024-07-04 DIAGNOSIS — C7931 Secondary malignant neoplasm of brain: Secondary | ICD-10-CM | POA: Diagnosis not present

## 2024-07-04 DIAGNOSIS — Z452 Encounter for adjustment and management of vascular access device: Secondary | ICD-10-CM | POA: Insufficient documentation

## 2024-07-04 DIAGNOSIS — C7951 Secondary malignant neoplasm of bone: Secondary | ICD-10-CM | POA: Insufficient documentation

## 2024-07-04 DIAGNOSIS — C3490 Malignant neoplasm of unspecified part of unspecified bronchus or lung: Secondary | ICD-10-CM | POA: Insufficient documentation

## 2024-07-06 ENCOUNTER — Inpatient Hospital Stay: Attending: Oncology

## 2024-07-06 ENCOUNTER — Inpatient Hospital Stay

## 2024-07-06 ENCOUNTER — Inpatient Hospital Stay: Attending: Oncology | Admitting: Oncology

## 2024-07-06 ENCOUNTER — Encounter: Payer: Self-pay | Admitting: Oncology

## 2024-07-06 VITALS — BP 125/74 | HR 84 | Temp 98.1°F | Resp 18 | Ht 64.0 in | Wt 167.0 lb

## 2024-07-06 VITALS — BP 121/83 | HR 73 | Resp 18

## 2024-07-06 DIAGNOSIS — Z452 Encounter for adjustment and management of vascular access device: Secondary | ICD-10-CM

## 2024-07-06 DIAGNOSIS — C3412 Malignant neoplasm of upper lobe, left bronchus or lung: Secondary | ICD-10-CM

## 2024-07-06 DIAGNOSIS — Z5112 Encounter for antineoplastic immunotherapy: Secondary | ICD-10-CM | POA: Diagnosis not present

## 2024-07-06 LAB — CMP (CANCER CENTER ONLY)
ALT: 30 U/L (ref 0–44)
AST: 30 U/L (ref 15–41)
Albumin: 4.5 g/dL (ref 3.5–5.0)
Alkaline Phosphatase: 95 U/L (ref 38–126)
Anion gap: 10 (ref 5–15)
BUN: 7 mg/dL — ABNORMAL LOW (ref 8–23)
CO2: 24 mmol/L (ref 22–32)
Calcium: 9.5 mg/dL (ref 8.9–10.3)
Chloride: 106 mmol/L (ref 98–111)
Creatinine: 0.61 mg/dL (ref 0.44–1.00)
GFR, Estimated: >60 mL/min (ref >60)
Glucose, Bld: 96 mg/dL (ref 70–99)
Potassium: 4.2 mmol/L (ref 3.5–5.1)
Sodium: 141 mmol/L (ref 135–145)
Total Bilirubin: 0.4 mg/dL (ref 0.0–1.2)
Total Protein: 7 g/dL (ref 6.5–8.1)

## 2024-07-06 LAB — CBC WITH DIFFERENTIAL (CANCER CENTER ONLY)
Abs Immature Granulocytes: 0.01 K/uL (ref 0.00–0.07)
Basophils Absolute: 0 K/uL (ref 0.0–0.1)
Basophils Relative: 1 %
Eosinophils Absolute: 0.2 K/uL (ref 0.0–0.5)
Eosinophils Relative: 5 %
HCT: 46.2 % — ABNORMAL HIGH (ref 36.0–46.0)
Hemoglobin: 14.8 g/dL (ref 12.0–15.0)
Immature Granulocytes: 0 %
Lymphocytes Relative: 17 %
Lymphs Abs: 0.8 K/uL (ref 0.7–4.0)
MCH: 28.1 pg (ref 26.0–34.0)
MCHC: 32 g/dL (ref 30.0–36.0)
MCV: 87.7 fL (ref 80.0–100.0)
Monocytes Absolute: 0.3 K/uL (ref 0.1–1.0)
Monocytes Relative: 7 %
Neutro Abs: 3.3 K/uL (ref 1.7–7.7)
Neutrophils Relative %: 70 %
Platelet Count: 165 K/uL (ref 150–400)
RBC: 5.27 MIL/uL — ABNORMAL HIGH (ref 3.87–5.11)
RDW: 13.3 % (ref 11.5–15.5)
WBC Count: 4.7 K/uL (ref 4.0–10.5)
nRBC: 0 % (ref 0.0–0.2)

## 2024-07-06 LAB — TSH: TSH: 1.68 u[IU]/mL (ref 0.350–4.500)

## 2024-07-06 MED ORDER — SODIUM CHLORIDE 0.9 % IV SOLN: INTRAVENOUS | Status: DC

## 2024-07-06 MED ORDER — ZOLEDRONIC ACID 4 MG/100ML IV SOLN
4.0000 mg | Freq: Once | INTRAVENOUS | Status: AC
  Administered 2024-07-06: 12:00:00 4 mg via INTRAVENOUS
  Filled 2024-07-06: qty 100

## 2024-07-06 MED ORDER — SODIUM CHLORIDE 0.9 % IV SOLN
1200.0000 mg | Freq: Once | INTRAVENOUS | Status: AC
  Administered 2024-07-06: 13:00:00 1200 mg via INTRAVENOUS
  Filled 2024-07-06: qty 20

## 2024-07-06 NOTE — Progress Notes (Signed)
 [facility] at [facility] [address] [place],  [place]  [address] [phone]  Clinic Day:  07/06/2024  Referring physician: Rosalea [name], PA   HISTORY OF PRESENT ILLNESS:  The patient is a 63 y.o. 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  The patient comes in today to go over her CT scans and brain MRI to ascertain her new disease baseline after 10 cycles of maintenance atezolizumab .  Of note, this was preceded by 4 cycles of carboplatin /etoposide /atezolizumab .  Since her last visit, the patient has been doing well.  She denies having altered mentation or vision changes which concern her for worsening CNS metastasis.  She denies having new symptoms elsewhere which concern her for overt signs of disease progression.    PHYSICAL EXAM:  Blood pressure 125/74, pulse 84, temperature 98.1 F (36.7 C), temperature source Oral, resp. rate 18, height 5' 4 (1.626 m), weight 167 lb (75.8 kg), SpO2 97%. Wt Readings from Last 3 Encounters:  07/06/24 167 lb (75.8 kg)  06/15/24 165 lb 4.8 oz (75 kg)  05/25/24 172 lb 6.4 oz (78.2 kg)   Body mass index is 28.67 kg/m. Performance status (ECOG): 1 - Symptomatic but completely ambulatory Physical Exam Constitutional:      Appearance: Normal appearance. She is not ill-appearing.     Comments: She ambulates on her own power today   HENT:     Mouth/Throat:     Mouth: Mucous membranes are moist. No oral lesions.     Tongue: No lesions.     Pharynx: Oropharynx is clear. No oropharyngeal exudate or posterior oropharyngeal erythema.  Cardiovascular:     Rate and Rhythm: Normal rate and regular rhythm.     Heart sounds: No murmur heard.    No friction rub. No gallop.  Pulmonary:     Effort: Pulmonary effort is normal. No respiratory distress.     Breath sounds: No decreased breath sounds, wheezing, rhonchi or rales.  Abdominal:     General: Bowel  sounds are normal. There is no distension.     Palpations: Abdomen is soft. There is no mass.     Tenderness: There is no abdominal tenderness.  Musculoskeletal:        General: No swelling.     Right lower leg: No edema.     Left lower leg: No edema.  Lymphadenopathy:     Cervical: No cervical adenopathy.     Upper Body:     Right upper body: No supraclavicular or axillary adenopathy.     Left upper body: No supraclavicular or axillary adenopathy.     Lower Body: No right inguinal adenopathy. No left inguinal adenopathy.  Skin:    General: Skin is warm.     Coloration: Skin is not jaundiced.     Findings: No lesion or rash.  Neurological:     General: No focal deficit present.     Mental Status: She is alert and oriented to person, place, and time. Mental status is at baseline.  Psychiatric:        Mood and Affect: Mood normal.        Behavior: Behavior normal.        Thought Content: Thought content normal.   SCANS:  Her brain MRI and CT scans of her chest/abdomen/pelvis revealed the following: FINDINGS: Diffusion-weighted images: No focal ischemic changes. ADC map: Negative. Gradient images: Negative..  Cerebrum: Rim enhancing lesion  in the left frontal lobe decreased in size. Currently measures 1.0 by 0.4 cm. Previously measured 1.9 x 1.3 cm. Small cystic structure in the right upper frontal lobe unchanged. Irregularly enhancing right parietal lesion has decreased in size , currently measures t 5 mm . previously measured 8.7 cm. Small enhancing lesions in left frontal lobe are unchanged No new lesions. The appropriate cerebral volume . Negative for other mass the frontal, parietal, temporal and occipital lobes [otherwise] negative. White matter: Moderate periventricular white matter changes. Scattered T2 lesions in the centrum semiovale. Cerebellum: Min enhancing lesion in the left cerebellum is decreased in size currently measures 1.0 cm , previously measured 1.7 x 1.0  cm. Remainder of the cerebellum negative. CSF pathways and ventricles: Negative. Negative for ventricular dilatation or obstruction. Basal ganglia and thalami: Slight periventricular white matter changes. No acute infarctions. Brainstem: Midbrain, pons and medulla negative. Midline structures: The corpus callosum, pituitary fossa , cerebellar tonsils negative. Limbic system: Medial temporal lobe image system negative. Extra-axial spaces: No enhancing lesion negative for hemorrhage. Orbital structures: Negative. Extraocular muscles negative. Paranasal sinuses: Impacted Remainder of the sinuses negative. Vascular structures: Normal flow voids. Other: No other or new areas of enhancement. Remainder of exam negative.  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 ------------------------------------------------------------------------------------------------- Although not read, per inspection of her CT scans, there remains no evidence of visceral disease recurrence.    LABS:      Latest Ref Rng & Units 07/06/2024   10:43 AM 06/15/2024   11:23 AM 05/25/2024   10:59 AM  CBC  WBC 4.0 - 10.5 K/uL 4.7  5.3  5.4   Hemoglobin 12.0 - 15.0 g/dL 85.1  84.7  84.8   Hematocrit 36.0 - 46.0 % 46.2  47.6  46.8   Platelets 150 - 400 K/uL 165  168  174       Latest Ref Rng & Units 07/06/2024   10:43 AM 06/15/2024   11:23 AM 05/25/2024   10:59 AM  CMP  Glucose 70 - 99 mg/dL 96  93  99   BUN 8 - 23 mg/dL 7  6  <5   Creatinine 9.55 - 1.00 mg/dL 9.38  9.30  9.38   Sodium 135 - 145 mmol/L 141  140  141   Potassium 3.5 - 5.1 mmol/L 4.2  4.3  4.1   Chloride 98 - 111 mmol/L 106  103  105   CO2 22 - 32 mmol/L 24  25  25    Calcium 8.9 - 10.3 mg/dL 9.5  89.7  89.9   Total Protein 6.5 - 8.1 g/dL 7.0  7.0  7.2   Total Bilirubin 0.0 - 1.2 mg/dL 0.4  0.3  0.4   Alkaline Phos 38 - 126 U/L 95  96  89   AST 15 - 41 U/L 30  30  36   ALT 0 - 44 U/L 30  31  43    ASSESSMENT & PLAN:  A  63 y.o. female with extensive stage small cell lung cancer, including spread of disease to her brain, liver and bones.  In clinic today, I went over all of her CT and brain MRI images with her, for which she could see her brain lesions are clearly better.  She could also see that there remains no evidence of disease progression throughout her body.  Understandably, the patient was pleased with her CT and brain MRI images.  The patient will proceed with her 11th cycle of maintenance atezolizumab  today.  Clinically, the patient appears to be doing well.  I will see this patient back in 3 weeks before she heads into her 12th cycle of maintenance atezolizumab  immunotherapy.  The patient understands all the plans discussed today and is in agreement with them.  Aracelis Ulrey DELENA Kerns, MD

## 2024-07-06 NOTE — Patient Instructions (Signed)
 Atezolizumab  Injection What is this medication? ATEZOLIZUMAB  (a te zoe LIZ ue mab) treats some types of cancer. It works by helping your immune system slow or stop the spread of cancer cells. It is a monoclonal antibody. This medicine may be used for other purposes; ask your health care provider or pharmacist if you have questions. COMMON BRAND NAME(S): Tecentriq  What should I tell my care team before I take this medication? They need to know if you have any of these conditions: Allogeneic stem cell transplant (uses someone else's stem cells) Autoimmune diseases, such as Crohn disease, ulcerative colitis, lupus History of chest radiation Nervous system problems, such as Guillain-Barre syndrome, myasthenia gravis Organ transplant An unusual or allergic reaction to atezolizumab , other medications, foods, dyes, or preservatives Pregnant or trying to get pregnant Breast-feeding How should I use this medication? This medication is injected into a vein. It is given by your care team in a hospital or clinic setting. A special MedGuide will be given to you before each treatment. Be sure to read this information carefully each time. Talk to your care team about the use of this medication in children. While it may be prescribed for children as young as 2 years for selected conditions, precautions do apply. Overdosage: If you think you have taken too much of this medicine contact a poison control center or emergency room at once. NOTE: This medicine is only for you. Do not share this medicine with others. What if I miss a dose? Keep appointments for follow-up doses. It is important not to miss your dose. Call your care team if you are unable to keep an appointment. What may interact with this medication? Interactions have not been studied. This list may not describe all possible interactions. Give your health care provider a list of all the medicines, herbs, non-prescription drugs, or dietary  supplements you use. Also tell them if you smoke, drink alcohol, or use illegal drugs. Some items may interact with your medicine. What should I watch for while using this medication? Your condition will be monitored carefully while you are receiving this medication. You may need blood work done while you are taking this medication. This medication may cause serious skin reactions. They can happen weeks to months after starting the medication. Contact your care team right away if you notice fevers or flu-like symptoms with a rash. The rash may be red or purple and then turn into blisters or peeling of the skin. You may also notice a red rash with swelling of the face, lips, or lymph nodes in your neck or under your arms. Talk to your care team if you may be pregnant. Serious birth defects can occur if you take this medication during pregnancy and for 5 months after the last dose. You will need a negative pregnancy test before starting this medication. Contraception is recommended while taking this medication and for 5 months after the last dose. Your care team can help you find the option that works for you. Do not breastfeed while taking this medication and for 5 months after the last dose. This medication may cause infertility. Talk to your care team if you are concerned about your fertility. What side effects may I notice from receiving this medication? Side effects that you should report to your doctor or health care professional as soon as possible: Allergic reactions--skin rash, itching, hives, swelling of the face, lips, tongue, or throat Dry cough, shortness of breath or trouble breathing Eye pain, redness, irritation, or discharge  with blurry or decreased vision Heart muscle inflammation--unusual weakness or fatigue, shortness of breath, chest pain, fast or irregular heartbeat, dizziness, swelling of the ankles, feet, or hands Hormone gland problems--headache, sensitivity to light, unusual  weakness or fatigue, dizziness, fast or irregular heartbeat, increased sensitivity to cold or heat, excessive sweating, constipation, hair loss, increased thirst or amount of urine, tremors or shaking, irritability Infusion reactions--chest pain, shortness of breath or trouble breathing, feeling faint or lightheaded Kidney injury (glomerulonephritis)--decrease in the amount of urine, red or dark brown urine, foamy or bubbly urine, swelling of the ankles, hands, or feet Liver injury--right upper belly pain, loss of appetite, nausea, light-colored stool, dark yellow or brown urine, yellowing skin or eyes, unusual weakness or fatigue Pain, tingling, or numbness in the hands or feet, muscle weakness, change in vision, confusion or trouble speaking, loss of balance or coordination, trouble walking, seizures Rash, fever, and swollen lymph nodes Redness, blistering, peeling, or loosening of the skin, including inside the mouth Sudden or severe stomach pain, bloody diarrhea, fever, nausea, vomiting Side effects that usually do not require medical attention (report to your doctor or health care professional if they continue or are bothersome): Bone, joint, or muscle pain Diarrhea Fatigue Loss of appetite Nausea Skin rash This list may not describe all possible side effects. Call your doctor for medical advice about side effects. You may report side effects to FDA at 1-800-FDA-1088. Where should I keep my medication? This medication is given in a hospital or clinic. It will not be stored at home. NOTE: This sheet is a summary. It may not cover all possible information. If you have questions about this medicine, talk to your doctor, pharmacist, or health care provider.  2024 Elsevier/Gold Standard (2023-04-22 00:00:00)[facility] [place] - A DEPT OF [place] [facility]  Discharge Instructions: Thank you for choosing [facility] to provide your oncology and hematology care.  If  you have a lab appointment with the Cancer Center, please go directly to the Cancer Center and check in at the registration area.   Wear comfortable clothing and clothing appropriate for easy access to any Portacath or PICC line.   We strive to give you quality time with your provider. You may need to reschedule your appointment if you arrive late (15 or more minutes).  Arriving late affects you and other patients whose appointments are after yours.  Also, if you miss three or more appointments without notifying the office, you may be dismissed from the clinic at the provider's discretion.      For prescription refill requests, have your pharmacy contact our office and allow 72 hours for refills to be completed.    Today you received the following chemotherapy and/or immunotherapy agents ATEZOLIZUMAB       To help prevent nausea and vomiting after your treatment, we encourage you to take your nausea medication as directed.  BELOW ARE SYMPTOMS THAT SHOULD BE REPORTED IMMEDIATELY: *FEVER GREATER THAN 100.4 F (38 C) OR HIGHER *CHILLS OR SWEATING *NAUSEA AND VOMITING THAT IS NOT CONTROLLED WITH YOUR NAUSEA MEDICATION *UNUSUAL SHORTNESS OF BREATH *UNUSUAL BRUISING OR BLEEDING *URINARY PROBLEMS (pain or burning when urinating, or frequent urination) *BOWEL PROBLEMS (unusual diarrhea, constipation, pain near the anus) TENDERNESS IN MOUTH AND THROAT WITH OR WITHOUT PRESENCE OF ULCERS (sore throat, sores in mouth, or a toothache) UNUSUAL RASH, SWELLING OR PAIN  UNUSUAL VAGINAL DISCHARGE OR ITCHING   Items with * indicate a potential emergency and should be followed  up as soon as possible or go to the Emergency Department if any problems should occur.  Please show the CHEMOTHERAPY ALERT CARD or IMMUNOTHERAPY ALERT CARD at check-in to the Emergency Department and triage nurse.  Should you have questions after your visit or need to cancel or reschedule your appointment, please contact [facility]  [place] - A DEPT OF [facility]  Dept: [phone]  and follow the prompts.  Office hours are 8:00 a.m. to 4:30 p.m. Monday - Friday. Please note that voicemails left after 4:00 p.m. may not be returned until the following business day.  We are closed weekends and major holidays. You have access to a nurse at all times for urgent questions. Please call the main number to the clinic Dept: [phone] and follow the prompts.  For any non-urgent questions, you may also contact your provider using MyChart. We now offer e-Visits for anyone 77 and older to request care online for non-urgent symptoms. For details visit [email or web address].   Also download the MyChart app! Go to the app store, search MyChart, open the app, select [place], and log in with your MyChart username and password.

## 2024-07-07 ENCOUNTER — Encounter: Payer: Self-pay | Admitting: Oncology

## 2024-07-07 LAB — T4: T4, Total: 6.2 ug/dL (ref 4.5–12.0)

## 2024-07-08 ENCOUNTER — Other Ambulatory Visit: Payer: Self-pay

## 2024-07-14 ENCOUNTER — Other Ambulatory Visit: Payer: Self-pay

## 2024-07-18 ENCOUNTER — Other Ambulatory Visit: Payer: Self-pay

## 2024-07-25 ENCOUNTER — Encounter: Payer: Self-pay | Admitting: Oncology

## 2024-07-26 NOTE — Progress Notes (Unsigned)
 " [facility] at [facility] [address] [place],  [place]  [address] [phone]  Clinic Day:  07/06/2024  Referring physician: Rosalea [name], PA   HISTORY OF PRESENT ILLNESS:  The patient is a 64 y.o. 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  The patient comes in today to e evaluated before heading into her 12th cycle of maintenance atezolizumab .  Of note, this was preceded by 4 cycles of carboplatin /etoposide /atezolizumab .  Since her last visit, the patient has been doing well.  She denies having altered mentation or vision changes which concern her for worsening CNS metastasis.  She denies having new symptoms elsewhere which concern her for overt signs of disease progression.    PHYSICAL EXAM:  There were no vitals taken for this visit. Wt Readings from Last 3 Encounters:  07/06/24 167 lb (75.8 kg)  06/15/24 165 lb 4.8 oz (75 kg)  05/25/24 172 lb 6.4 oz (78.2 kg)   There is no height or weight on file to calculate BMI. Performance status (ECOG): 1 - Symptomatic but completely ambulatory Physical Exam Constitutional:      Appearance: Normal appearance. She is not ill-appearing.     Comments: She ambulates on her own power today   HENT:     Mouth/Throat:     Mouth: Mucous membranes are moist. No oral lesions.     Tongue: No lesions.     Pharynx: Oropharynx is clear. No oropharyngeal exudate or posterior oropharyngeal erythema.  Cardiovascular:     Rate and Rhythm: Normal rate and regular rhythm.     Heart sounds: No murmur heard.    No friction rub. No gallop.  Pulmonary:     Effort: Pulmonary effort is normal. No respiratory distress.     Breath sounds: No decreased breath sounds, wheezing, rhonchi or rales.  Abdominal:     General: Bowel sounds are normal. There is no distension.     Palpations: Abdomen is soft. There is no mass.     Tenderness: There is no abdominal  tenderness.  Musculoskeletal:        General: No swelling.     Right lower leg: No edema.     Left lower leg: No edema.  Lymphadenopathy:     Cervical: No cervical adenopathy.     Upper Body:     Right upper body: No supraclavicular or axillary adenopathy.     Left upper body: No supraclavicular or axillary adenopathy.     Lower Body: No right inguinal adenopathy. No left inguinal adenopathy.  Skin:    General: Skin is warm.     Coloration: Skin is not jaundiced.     Findings: No lesion or rash.  Neurological:     General: No focal deficit present.     Mental Status: She is alert and oriented to person, place, and time. Mental status is at baseline.  Psychiatric:        Mood and Affect: Mood normal.        Behavior: Behavior normal.        Thought Content: Thought content normal.   SCANS:  Her brain MRI and CT scans of her chest/abdomen/pelvis revealed the following: FINDINGS: Diffusion-weighted images: No focal ischemic changes. ADC map: Negative. Gradient images: Negative..  Cerebrum: Rim enhancing lesion in the left frontal lobe decreased in size. Currently measures 1.0 by 0.4 cm. Previously measured 1.9 x 1.3 cm. Small cystic structure  in the right upper frontal lobe unchanged. Irregularly enhancing right parietal lesion has decreased in size , currently measures t 5 mm . previously measured 8.7 cm. Small enhancing lesions in left frontal lobe are unchanged No new lesions. The appropriate cerebral volume . Negative for other mass the frontal, parietal, temporal and occipital lobes [otherwise] negative. White matter: Moderate periventricular white matter changes. Scattered T2 lesions in the centrum semiovale. Cerebellum: Min enhancing lesion in the left cerebellum is decreased in size currently measures 1.0 cm , previously measured 1.7 x 1.0 cm. Remainder of the cerebellum negative. CSF pathways and ventricles: Negative. Negative for ventricular dilatation or  obstruction. Basal ganglia and thalami: Slight periventricular white matter changes. No acute infarctions. Brainstem: Midbrain, pons and medulla negative. Midline structures: The corpus callosum, pituitary fossa , cerebellar tonsils negative. Limbic system: Medial temporal lobe image system negative. Extra-axial spaces: No enhancing lesion negative for hemorrhage. Orbital structures: Negative. Extraocular muscles negative. Paranasal sinuses: Impacted Remainder of the sinuses negative. Vascular structures: Normal flow voids. Other: No other or new areas of enhancement. Remainder of exam negative.  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 ------------------------------------------------------------------------------------------------- Although not read, per inspection of her CT scans, there remains no evidence of visceral disease recurrence.    LABS:      Latest Ref Rng & Units 07/06/2024   10:43 AM 06/15/2024   11:23 AM 05/25/2024   10:59 AM  CBC  WBC 4.0 - 10.5 K/uL 4.7  5.3  5.4   Hemoglobin 12.0 - 15.0 g/dL 85.1  84.7  84.8   Hematocrit 36.0 - 46.0 % 46.2  47.6  46.8   Platelets 150 - 400 K/uL 165  168  174       Latest Ref Rng & Units 07/06/2024   10:43 AM 06/15/2024   11:23 AM 05/25/2024   10:59 AM  CMP  Glucose 70 - 99 mg/dL 96  93  99   BUN 8 - 23 mg/dL 7  6  <5   Creatinine 9.55 - 1.00 mg/dL 9.38  9.30  9.38   Sodium 135 - 145 mmol/L 141  140  141   Potassium 3.5 - 5.1 mmol/L 4.2  4.3  4.1   Chloride 98 - 111 mmol/L 106  103  105   CO2 22 - 32 mmol/L 24  25  25    Calcium 8.9 - 10.3 mg/dL 9.5  89.7  89.9   Total Protein 6.5 - 8.1 g/dL 7.0  7.0  7.2   Total Bilirubin 0.0 - 1.2 mg/dL 0.4  0.3  0.4   Alkaline Phos 38 - 126 U/L 95  96  89   AST 15 - 41 U/L 30  30  36   ALT 0 - 44 U/L 30  31  43    ASSESSMENT & PLAN:  A 64 y.o. female with extensive stage small cell lung cancer, including spread of disease to her brain, liver and bones.  In  clinic today, I went over all of her CT and brain MRI images with her, for which she could see her brain lesions are clearly better.  She could also see that there remains no evidence of disease progression throughout her body.  Understandably, the patient was pleased with her CT and brain MRI images.  The patient will proceed with her 11th cycle of maintenance atezolizumab  today.  Clinically, the patient appears to be doing well.  I will see this patient back in 3 weeks before she heads into her  12th cycle of maintenance atezolizumab  immunotherapy.  The patient understands all the plans discussed today and is in agreement with them.  Limuel Nieblas DELENA Kerns, MD    "

## 2024-07-27 ENCOUNTER — Inpatient Hospital Stay

## 2024-07-27 ENCOUNTER — Inpatient Hospital Stay: Attending: Oncology

## 2024-07-27 ENCOUNTER — Inpatient Hospital Stay: Attending: Oncology | Admitting: Oncology

## 2024-07-27 VITALS — BP 122/78 | HR 62 | Temp 97.9°F | Resp 16 | Ht 64.0 in | Wt 162.7 lb

## 2024-07-27 DIAGNOSIS — C787 Secondary malignant neoplasm of liver and intrahepatic bile duct: Secondary | ICD-10-CM | POA: Insufficient documentation

## 2024-07-27 DIAGNOSIS — C3412 Malignant neoplasm of upper lobe, left bronchus or lung: Secondary | ICD-10-CM

## 2024-07-27 DIAGNOSIS — C3490 Malignant neoplasm of unspecified part of unspecified bronchus or lung: Secondary | ICD-10-CM | POA: Insufficient documentation

## 2024-07-27 DIAGNOSIS — Z79620 Long term (current) use of immunosuppressive biologic: Secondary | ICD-10-CM | POA: Diagnosis not present

## 2024-07-27 DIAGNOSIS — Z5112 Encounter for antineoplastic immunotherapy: Secondary | ICD-10-CM | POA: Insufficient documentation

## 2024-07-27 DIAGNOSIS — C7931 Secondary malignant neoplasm of brain: Secondary | ICD-10-CM | POA: Insufficient documentation

## 2024-07-27 DIAGNOSIS — C7951 Secondary malignant neoplasm of bone: Secondary | ICD-10-CM | POA: Diagnosis not present

## 2024-07-27 LAB — CBC WITH DIFFERENTIAL (CANCER CENTER ONLY)
Abs Immature Granulocytes: 0.01 K/uL (ref 0.00–0.07)
Basophils Absolute: 0 K/uL (ref 0.0–0.1)
Basophils Relative: 1 %
Eosinophils Absolute: 0.2 K/uL (ref 0.0–0.5)
Eosinophils Relative: 4 %
HCT: 44.9 % (ref 36.0–46.0)
Hemoglobin: 14.6 g/dL (ref 12.0–15.0)
Immature Granulocytes: 0 %
Lymphocytes Relative: 16 %
Lymphs Abs: 0.9 K/uL (ref 0.7–4.0)
MCH: 28.7 pg (ref 26.0–34.0)
MCHC: 32.5 g/dL (ref 30.0–36.0)
MCV: 88.2 fL (ref 80.0–100.0)
Monocytes Absolute: 0.3 K/uL (ref 0.1–1.0)
Monocytes Relative: 6 %
Neutro Abs: 4 K/uL (ref 1.7–7.7)
Neutrophils Relative %: 73 %
Platelet Count: 153 K/uL (ref 150–400)
RBC: 5.09 MIL/uL (ref 3.87–5.11)
RDW: 14.3 % (ref 11.5–15.5)
WBC Count: 5.4 K/uL (ref 4.0–10.5)
nRBC: 0 % (ref 0.0–0.2)

## 2024-07-27 LAB — CMP (CANCER CENTER ONLY)
ALT: 15 U/L (ref 0–44)
AST: 21 U/L (ref 15–41)
Albumin: 4.3 g/dL (ref 3.5–5.0)
Alkaline Phosphatase: 80 U/L (ref 38–126)
Anion gap: 10 (ref 5–15)
BUN: 7 mg/dL — ABNORMAL LOW (ref 8–23)
CO2: 23 mmol/L (ref 22–32)
Calcium: 9.7 mg/dL (ref 8.9–10.3)
Chloride: 106 mmol/L (ref 98–111)
Creatinine: 0.59 mg/dL (ref 0.44–1.00)
GFR, Estimated: >60 mL/min
Glucose, Bld: 83 mg/dL (ref 70–99)
Potassium: 4.2 mmol/L (ref 3.5–5.1)
Sodium: 138 mmol/L (ref 135–145)
Total Bilirubin: 0.4 mg/dL (ref 0.0–1.2)
Total Protein: 6.8 g/dL (ref 6.5–8.1)

## 2024-07-27 LAB — TSH: TSH: 0.911 u[IU]/mL (ref 0.350–4.500)

## 2024-07-27 MED ORDER — HYDROMORPHONE HCL 1 MG/ML IJ SOLN
1.0000 mg | INTRAMUSCULAR | Status: DC | PRN
  Administered 2024-07-27: 1 mg via INTRAVENOUS
  Filled 2024-07-27: qty 1

## 2024-07-27 MED ORDER — SODIUM CHLORIDE 0.9 % IV SOLN: INTRAVENOUS | Status: DC

## 2024-07-27 MED ORDER — SODIUM CHLORIDE 0.9 % IV SOLN
1200.0000 mg | Freq: Once | INTRAVENOUS | Status: AC
  Administered 2024-07-27: 1200 mg via INTRAVENOUS
  Filled 2024-07-27: qty 20

## 2024-07-27 NOTE — Patient Instructions (Signed)
 [facility] [place] - A DEPT OF [facility]  Discharge Instructions: Thank you for choosing [facility] to provide your oncology and hematology care.  If you have a lab appointment with the Cancer Center, please go directly to the Cancer Center and check in at the registration area.   Wear comfortable clothing and clothing appropriate for easy access to any Portacath or PICC line.   We strive to give you quality time with your provider. You may need to reschedule your appointment if you arrive late (15 or more minutes).  Arriving late affects you and other patients whose appointments are after yours.  Also, if you miss three or more appointments without notifying the office, you may be dismissed from the clinic at the providers discretion.      For prescription refill requests, have your pharmacy contact our office and allow 72 hours for refills to be completed.    Today you received the following chemotherapy and/or immunotherapy agents ATEZOLIZUMABAtezolizumab Injection What is this medication? ATEZOLIZUMAB  (a te zoe LIZ ue mab) treats some types of cancer. It works by helping your immune system slow or stop the spread of cancer cells. It is a monoclonal antibody. This medicine may be used for other purposes; ask your health care provider or pharmacist if you have questions. COMMON BRAND NAME(S): Tecentriq  What should I tell my care team before I take this medication? They need to know if you have any of these conditions: Allogeneic stem cell transplant (uses someone else's stem cells) Autoimmune diseases, such as Crohn disease, ulcerative colitis, lupus History of chest radiation Nervous system problems, such as Guillain-Barre syndrome, myasthenia gravis Organ transplant An unusual or allergic reaction to atezolizumab , other medications, foods, dyes, or preservatives Pregnant or trying to get pregnant Breast-feeding How should I use this  medication? This medication is injected into a vein. It is given by your care team in a hospital or clinic setting. A special MedGuide will be given to you before each treatment. Be sure to read this information carefully each time. Talk to your care team about the use of this medication in children. While it may be prescribed for children as young as 2 years for selected conditions, precautions do apply. Overdosage: If you think you have taken too much of this medicine contact a poison control center or emergency room at once. NOTE: This medicine is only for you. Do not share this medicine with others. What if I miss a dose? Keep appointments for follow-up doses. It is important not to miss your dose. Call your care team if you are unable to keep an appointment. What may interact with this medication? Interactions have not been studied. This list may not describe all possible interactions. Give your health care provider a list of all the medicines, herbs, non-prescription drugs, or dietary supplements you use. Also tell them if you smoke, drink alcohol, or use illegal drugs. Some items may interact with your medicine. What should I watch for while using this medication? Your condition will be monitored carefully while you are receiving this medication. You may need blood work done while you are taking this medication. This medication may cause serious skin reactions. They can happen weeks to months after starting the medication. Contact your care team right away if you notice fevers or flu-like symptoms with a rash. The rash may be red or purple and then turn into blisters or peeling of the skin. You may also notice a  red rash with swelling of the face, lips, or lymph nodes in your neck or under your arms. Talk to your care team if you may be pregnant. Serious birth defects can occur if you take this medication during pregnancy and for 5 months after the last dose. You will need a negative pregnancy  test before starting this medication. Contraception is recommended while taking this medication and for 5 months after the last dose. Your care team can help you find the option that works for you. Do not breastfeed while taking this medication and for 5 months after the last dose. This medication may cause infertility. Talk to your care team if you are concerned about your fertility. What side effects may I notice from receiving this medication? Side effects that you should report to your doctor or health care professional as soon as possible: Allergic reactions--skin rash, itching, hives, swelling of the face, lips, tongue, or throat Dry cough, shortness of breath or trouble breathing Eye pain, redness, irritation, or discharge with blurry or decreased vision Heart muscle inflammation--unusual weakness or fatigue, shortness of breath, chest pain, fast or irregular heartbeat, dizziness, swelling of the ankles, feet, or hands Hormone gland problems--headache, sensitivity to light, unusual weakness or fatigue, dizziness, fast or irregular heartbeat, increased sensitivity to cold or heat, excessive sweating, constipation, hair loss, increased thirst or amount of urine, tremors or shaking, irritability Infusion reactions--chest pain, shortness of breath or trouble breathing, feeling faint or lightheaded Kidney injury (glomerulonephritis)--decrease in the amount of urine, red or dark brown urine, foamy or bubbly urine, swelling of the ankles, hands, or feet Liver injury--right upper belly pain, loss of appetite, nausea, light-colored stool, dark yellow or brown urine, yellowing skin or eyes, unusual weakness or fatigue Pain, tingling, or numbness in the hands or feet, muscle weakness, change in vision, confusion or trouble speaking, loss of balance or coordination, trouble walking, seizures Rash, fever, and swollen lymph nodes Redness, blistering, peeling, or loosening of the skin, including inside the  mouth Sudden or severe stomach pain, bloody diarrhea, fever, nausea, vomiting Side effects that usually do not require medical attention (report to your doctor or health care professional if they continue or are bothersome): Bone, joint, or muscle pain Diarrhea Fatigue Loss of appetite Nausea Skin rash This list may not describe all possible side effects. Call your doctor for medical advice about side effects. You may report side effects to FDA at 1-800-FDA-1088. Where should I keep my medication? This medication is given in a hospital or clinic. It will not be stored at home. NOTE: This sheet is a summary. It may not cover all possible information. If you have questions about this medicine, talk to your doctor, pharmacist, or health care provider.  2024 Elsevier/Gold Standard (2023-04-22 00:00:00)      To help prevent nausea and vomiting after your treatment, we encourage you to take your nausea medication as directed.  BELOW ARE SYMPTOMS THAT SHOULD BE REPORTED IMMEDIATELY: *FEVER GREATER THAN 100.4 F (38 C) OR HIGHER *CHILLS OR SWEATING *NAUSEA AND VOMITING THAT IS NOT CONTROLLED WITH YOUR NAUSEA MEDICATION *UNUSUAL SHORTNESS OF BREATH *UNUSUAL BRUISING OR BLEEDING *URINARY PROBLEMS (pain or burning when urinating, or frequent urination) *BOWEL PROBLEMS (unusual diarrhea, constipation, pain near the anus) TENDERNESS IN MOUTH AND THROAT WITH OR WITHOUT PRESENCE OF ULCERS (sore throat, sores in mouth, or a toothache) UNUSUAL RASH, SWELLING OR PAIN  UNUSUAL VAGINAL DISCHARGE OR ITCHING   Items with * indicate a potential emergency and should be followed  up as soon as possible or go to the Emergency Department if any problems should occur.  Please show the CHEMOTHERAPY ALERT CARD or IMMUNOTHERAPY ALERT CARD at check-in to the Emergency Department and triage nurse.  Should you have questions after your visit or need to cancel or reschedule your appointment, please contact [facility] [place] - A DEPT OF [facility]  Dept: [phone]  and follow the prompts.  Office hours are 8:00 a.m. to 4:30 p.m. Monday - Friday. Please note that voicemails left after 4:00 p.m. may not be returned until the following business day.  We are closed weekends and major holidays. You have access to a nurse at all times for urgent questions. Please call the main number to the clinic Dept: [phone] and follow the prompts.  For any non-urgent questions, you may also contact your provider using MyChart. We now offer e-Visits for anyone 44 and older to request care online for non-urgent symptoms. For details visit [email or web address].   Also download the MyChart app! Go to the app store, search MyChart, open the app, select [place], and log in with your MyChart username and password.

## 2024-07-27 NOTE — Progress Notes (Signed)
 PATIENT C/O LESION ON RIGHT BREAST THAT HAS BEEN THERE X 2 WEEKS, HARD, NO PAIN, RED.

## 2024-07-28 LAB — T4: T4, Total: 6 ug/dL (ref 4.5–12.0)

## 2024-08-16 NOTE — Progress Notes (Unsigned)
 " [facility] at [facility] [address] [place],  [place]  [address] [phone]  Clinic Day:  07/27/2024  Referring physician: Rosalea [name], PA   HISTORY OF PRESENT ILLNESS:  The patient is a 64 y.o. 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  The patient comes in today to be evaluated before heading into her 13th cycle of maintenance atezolizumab .  This was preceded by 4 cycles of carboplatin /etoposide /atezolizumab .  Since her last visit, the patient has been doing okay.  She has noticed intermittent hip pain.  She also claims her balance has been more problematic.  Of note, she had a brain MRI on December 15th, which revealed improved disease after completing whole brain radiation.  She readily admits she has been more nervous as her cousin, who was previously staying with her, no longer is due to work obligations.  This, in combination with her stopping her Klonopin and pain medications (recommended by another physician) has her more anxious and uncertain.    PHYSICAL EXAM:  There were no vitals taken for this visit. Wt Readings from Last 3 Encounters:  07/27/24 162 lb 11.2 oz (73.8 kg)  07/06/24 167 lb (75.8 kg)  06/15/24 165 lb 4.8 oz (75 kg)   There is no height or weight on file to calculate BMI. Performance status (ECOG): 1 - Symptomatic but completely ambulatory Physical Exam Constitutional:      Appearance: Normal appearance. She is not ill-appearing.     Comments: She ambulates on her own power today   HENT:     Mouth/Throat:     Mouth: Mucous membranes are moist. No oral lesions.     Tongue: No lesions.     Pharynx: Oropharynx is clear. No oropharyngeal exudate or posterior oropharyngeal erythema.  Cardiovascular:     Rate and Rhythm: Normal rate and regular rhythm.     Heart sounds: No murmur heard.    No friction rub. No gallop.  Pulmonary:     Effort: Pulmonary effort  is normal. No respiratory distress.     Breath sounds: No decreased breath sounds, wheezing, rhonchi or rales.  Abdominal:     General: Bowel sounds are normal. There is no distension.     Palpations: Abdomen is soft. There is no mass.     Tenderness: There is no abdominal tenderness.  Musculoskeletal:        General: No swelling.     Right lower leg: No edema.     Left lower leg: No edema.  Lymphadenopathy:     Cervical: No cervical adenopathy.     Upper Body:     Right upper body: No supraclavicular or axillary adenopathy.     Left upper body: No supraclavicular or axillary adenopathy.     Lower Body: No right inguinal adenopathy. No left inguinal adenopathy.  Skin:    General: Skin is warm.     Coloration: Skin is not jaundiced.     Findings: No lesion or rash.  Neurological:     General: No focal deficit present.     Mental Status: She is alert and oriented to person, place, and time. Mental status is at baseline.  Psychiatric:        Mood and Affect: Mood normal.        Behavior: Behavior normal.        Thought Content: Thought content normal.    LABS:  Latest Ref Rng & Units 07/27/2024   10:38 AM 07/06/2024   10:43 AM 06/15/2024   11:23 AM  CBC  WBC 4.0 - 10.5 K/uL 5.4  4.7  5.3   Hemoglobin 12.0 - 15.0 g/dL 85.3  85.1  84.7   Hematocrit 36.0 - 46.0 % 44.9  46.2  47.6   Platelets 150 - 400 K/uL 153  165  168       Latest Ref Rng & Units 07/27/2024   10:38 AM 07/06/2024   10:43 AM 06/15/2024   11:23 AM  CMP  Glucose 70 - 99 mg/dL 83  96  93   BUN 8 - 23 mg/dL 7  7  6    Creatinine 0.44 - 1.00 mg/dL 9.40  9.38  9.30   Sodium 135 - 145 mmol/L 138  141  140   Potassium 3.5 - 5.1 mmol/L 4.2  4.2  4.3   Chloride 98 - 111 mmol/L 106  106  103   CO2 22 - 32 mmol/L 23  24  25    Calcium 8.9 - 10.3 mg/dL 9.7  9.5  89.7   Total Protein 6.5 - 8.1 g/dL 6.8  7.0  7.0   Total Bilirubin 0.0 - 1.2 mg/dL 0.4  0.4  0.3   Alkaline Phos 38 - 126 U/L 80  95  96   AST 15 - 41  U/L 21  30  30    ALT 0 - 44 U/L 15  30  31     ASSESSMENT & PLAN:  A 64 y.o. female with extensive stage small cell lung cancer, including spread of disease to her brain, liver and bones.  The patient will proceed with her 12th cycle of maintenance atezolizumab  today.  Clinically, the patient appears to be doing well.  Overall, I appreciate nothing per her exam, labs or history which suggests she has acute disease progression.  As mentioned previously, scans less than one month ago show no evidence of new disea I will see this patient back in 3 weeks before she heads into her 13th cycle of maintenance atezolizumab  immunotherapy.  The patient understands all the plans discussed today and is in agreement with them.  Larose Batres DELENA Kerns, MD    "

## 2024-08-17 ENCOUNTER — Inpatient Hospital Stay

## 2024-08-17 ENCOUNTER — Inpatient Hospital Stay: Admitting: Oncology

## 2024-08-17 VITALS — BP 138/69 | HR 84 | Temp 97.4°F | Resp 16 | Ht 64.0 in | Wt 159.1 lb

## 2024-08-17 DIAGNOSIS — C3412 Malignant neoplasm of upper lobe, left bronchus or lung: Secondary | ICD-10-CM

## 2024-08-17 DIAGNOSIS — Z452 Encounter for adjustment and management of vascular access device: Secondary | ICD-10-CM

## 2024-08-17 DIAGNOSIS — Z5112 Encounter for antineoplastic immunotherapy: Secondary | ICD-10-CM | POA: Diagnosis not present

## 2024-08-17 LAB — CMP (CANCER CENTER ONLY)
ALT: 22 U/L (ref 0–44)
AST: 20 U/L (ref 15–41)
Albumin: 4.4 g/dL (ref 3.5–5.0)
Alkaline Phosphatase: 79 U/L (ref 38–126)
Anion gap: 11 (ref 5–15)
BUN: 6 mg/dL — ABNORMAL LOW (ref 8–23)
CO2: 24 mmol/L (ref 22–32)
Calcium: 9.8 mg/dL (ref 8.9–10.3)
Chloride: 105 mmol/L (ref 98–111)
Creatinine: 0.56 mg/dL (ref 0.44–1.00)
GFR, Estimated: >60 mL/min
Glucose, Bld: 91 mg/dL (ref 70–99)
Potassium: 4.4 mmol/L (ref 3.5–5.1)
Sodium: 139 mmol/L (ref 135–145)
Total Bilirubin: 0.4 mg/dL (ref 0.0–1.2)
Total Protein: 7.3 g/dL (ref 6.5–8.1)

## 2024-08-17 LAB — CBC WITH DIFFERENTIAL (CANCER CENTER ONLY)
Abs Immature Granulocytes: 0.02 10*3/uL (ref 0.00–0.07)
Basophils Absolute: 0 10*3/uL (ref 0.0–0.1)
Basophils Relative: 0 %
Eosinophils Absolute: 0.2 10*3/uL (ref 0.0–0.5)
Eosinophils Relative: 3 %
HCT: 46.4 % — ABNORMAL HIGH (ref 36.0–46.0)
Hemoglobin: 15.1 g/dL — ABNORMAL HIGH (ref 12.0–15.0)
Immature Granulocytes: 0 %
Lymphocytes Relative: 13 %
Lymphs Abs: 0.6 10*3/uL — ABNORMAL LOW (ref 0.7–4.0)
MCH: 28.7 pg (ref 26.0–34.0)
MCHC: 32.5 g/dL (ref 30.0–36.0)
MCV: 88.2 fL (ref 80.0–100.0)
Monocytes Absolute: 0.3 10*3/uL (ref 0.1–1.0)
Monocytes Relative: 5 %
Neutro Abs: 4 10*3/uL (ref 1.7–7.7)
Neutrophils Relative %: 79 %
Platelet Count: 139 10*3/uL — ABNORMAL LOW (ref 150–400)
RBC: 5.26 MIL/uL — ABNORMAL HIGH (ref 3.87–5.11)
RDW: 14.4 % (ref 11.5–15.5)
WBC Count: 5.1 10*3/uL (ref 4.0–10.5)
nRBC: 0 % (ref 0.0–0.2)

## 2024-08-17 LAB — TSH: TSH: 1.13 u[IU]/mL (ref 0.350–4.500)

## 2024-08-17 MED ORDER — ZOLEDRONIC ACID 4 MG/100ML IV SOLN
4.0000 mg | Freq: Once | INTRAVENOUS | Status: AC
  Administered 2024-08-17: 4 mg via INTRAVENOUS
  Filled 2024-08-17: qty 100

## 2024-08-17 MED ORDER — SODIUM CHLORIDE 0.9 % IV SOLN: INTRAVENOUS | Status: DC

## 2024-08-17 MED ORDER — SODIUM CHLORIDE 0.9 % IV SOLN
1200.0000 mg | Freq: Once | INTRAVENOUS | Status: AC
  Administered 2024-08-17: 1200 mg via INTRAVENOUS
  Filled 2024-08-17: qty 20

## 2024-08-17 NOTE — Patient Instructions (Signed)
 [facility] [place] - A DEPT OF [facility]  Discharge Instructions: Thank you for choosing [facility] to provide your oncology and hematology care.  If you have a lab appointment with the Cancer Center, please go directly to the Cancer Center and check in at the registration area.   Wear comfortable clothing and clothing appropriate for easy access to any Portacath or PICC line.   We strive to give you quality time with your provider. You may need to reschedule your appointment if you arrive late (15 or more minutes).  Arriving late affects you and other patients whose appointments are after yours.  Also, if you miss three or more appointments without notifying the office, you may be dismissed from the clinic at the providers discretion.      For prescription refill requests, have your pharmacy contact our office and allow 72 hours for refills to be completed.    Today you received the following chemotherapy and/or immunotherapy agents ATEZOLIZUMABAtezolizumab Injection What is this medication? ATEZOLIZUMAB  (a te zoe LIZ ue mab) treats some types of cancer. It works by helping your immune system slow or stop the spread of cancer cells. It is a monoclonal antibody. This medicine may be used for other purposes; ask your health care provider or pharmacist if you have questions. COMMON BRAND NAME(S): Tecentriq  What should I tell my care team before I take this medication? They need to know if you have any of these conditions: Allogeneic stem cell transplant (uses someone else's stem cells) Autoimmune diseases, such as Crohn disease, ulcerative colitis, lupus History of chest radiation Nervous system problems, such as Guillain-Barre syndrome, myasthenia gravis Organ transplant An unusual or allergic reaction to atezolizumab , other medications, foods, dyes, or preservatives Pregnant or trying to get pregnant Breast-feeding How should I use this  medication? This medication is injected into a vein. It is given by your care team in a hospital or clinic setting. A special MedGuide will be given to you before each treatment. Be sure to read this information carefully each time. Talk to your care team about the use of this medication in children. While it may be prescribed for children as young as 2 years for selected conditions, precautions do apply. Overdosage: If you think you have taken too much of this medicine contact a poison control center or emergency room at once. NOTE: This medicine is only for you. Do not share this medicine with others. What if I miss a dose? Keep appointments for follow-up doses. It is important not to miss your dose. Call your care team if you are unable to keep an appointment. What may interact with this medication? Interactions have not been studied. This list may not describe all possible interactions. Give your health care provider a list of all the medicines, herbs, non-prescription drugs, or dietary supplements you use. Also tell them if you smoke, drink alcohol, or use illegal drugs. Some items may interact with your medicine. What should I watch for while using this medication? Your condition will be monitored carefully while you are receiving this medication. You may need blood work done while you are taking this medication. This medication may cause serious skin reactions. They can happen weeks to months after starting the medication. Contact your care team right away if you notice fevers or flu-like symptoms with a rash. The rash may be red or purple and then turn into blisters or peeling of the skin. You may also notice a  red rash with swelling of the face, lips, or lymph nodes in your neck or under your arms. Talk to your care team if you may be pregnant. Serious birth defects can occur if you take this medication during pregnancy and for 5 months after the last dose. You will need a negative pregnancy  test before starting this medication. Contraception is recommended while taking this medication and for 5 months after the last dose. Your care team can help you find the option that works for you. Do not breastfeed while taking this medication and for 5 months after the last dose. This medication may cause infertility. Talk to your care team if you are concerned about your fertility. What side effects may I notice from receiving this medication? Side effects that you should report to your doctor or health care professional as soon as possible: Allergic reactions--skin rash, itching, hives, swelling of the face, lips, tongue, or throat Dry cough, shortness of breath or trouble breathing Eye pain, redness, irritation, or discharge with blurry or decreased vision Heart muscle inflammation--unusual weakness or fatigue, shortness of breath, chest pain, fast or irregular heartbeat, dizziness, swelling of the ankles, feet, or hands Hormone gland problems--headache, sensitivity to light, unusual weakness or fatigue, dizziness, fast or irregular heartbeat, increased sensitivity to cold or heat, excessive sweating, constipation, hair loss, increased thirst or amount of urine, tremors or shaking, irritability Infusion reactions--chest pain, shortness of breath or trouble breathing, feeling faint or lightheaded Kidney injury (glomerulonephritis)--decrease in the amount of urine, red or dark brown urine, foamy or bubbly urine, swelling of the ankles, hands, or feet Liver injury--right upper belly pain, loss of appetite, nausea, light-colored stool, dark yellow or brown urine, yellowing skin or eyes, unusual weakness or fatigue Pain, tingling, or numbness in the hands or feet, muscle weakness, change in vision, confusion or trouble speaking, loss of balance or coordination, trouble walking, seizures Rash, fever, and swollen lymph nodes Redness, blistering, peeling, or loosening of the skin, including inside the  mouth Sudden or severe stomach pain, bloody diarrhea, fever, nausea, vomiting Side effects that usually do not require medical attention (report to your doctor or health care professional if they continue or are bothersome): Bone, joint, or muscle pain Diarrhea Fatigue Loss of appetite Nausea Skin rash This list may not describe all possible side effects. Call your doctor for medical advice about side effects. You may report side effects to FDA at 1-800-FDA-1088. Where should I keep my medication? This medication is given in a hospital or clinic. It will not be stored at home. NOTE: This sheet is a summary. It may not cover all possible information. If you have questions about this medicine, talk to your doctor, pharmacist, or health care provider.  2024 Elsevier/Gold Standard (2023-04-22 00:00:00)      To help prevent nausea and vomiting after your treatment, we encourage you to take your nausea medication as directed.  BELOW ARE SYMPTOMS THAT SHOULD BE REPORTED IMMEDIATELY: *FEVER GREATER THAN 100.4 F (38 C) OR HIGHER *CHILLS OR SWEATING *NAUSEA AND VOMITING THAT IS NOT CONTROLLED WITH YOUR NAUSEA MEDICATION *UNUSUAL SHORTNESS OF BREATH *UNUSUAL BRUISING OR BLEEDING *URINARY PROBLEMS (pain or burning when urinating, or frequent urination) *BOWEL PROBLEMS (unusual diarrhea, constipation, pain near the anus) TENDERNESS IN MOUTH AND THROAT WITH OR WITHOUT PRESENCE OF ULCERS (sore throat, sores in mouth, or a toothache) UNUSUAL RASH, SWELLING OR PAIN  UNUSUAL VAGINAL DISCHARGE OR ITCHING   Items with * indicate a potential emergency and should be followed  up as soon as possible or go to the Emergency Department if any problems should occur.  Please show the CHEMOTHERAPY ALERT CARD or IMMUNOTHERAPY ALERT CARD at check-in to the Emergency Department and triage nurse.  Should you have questions after your visit or need to cancel or reschedule your appointment, please contact [facility] [place] - A DEPT OF [facility]  Dept: [phone]  and follow the prompts.  Office hours are 8:00 a.m. to 4:30 p.m. Monday - Friday. Please note that voicemails left after 4:00 p.m. may not be returned until the following business day.  We are closed weekends and major holidays. You have access to a nurse at all times for urgent questions. Please call the main number to the clinic Dept: [phone] and follow the prompts.  For any non-urgent questions, you may also contact your provider using MyChart. We now offer e-Visits for anyone 44 and older to request care online for non-urgent symptoms. For details visit [email or web address].   Also download the MyChart app! Go to the app store, search MyChart, open the app, select [place], and log in with your MyChart username and password.

## 2024-08-18 ENCOUNTER — Other Ambulatory Visit: Payer: Self-pay

## 2024-08-18 LAB — T4: T4, Total: 6.2 ug/dL (ref 4.5–12.0)

## 2024-09-07 ENCOUNTER — Inpatient Hospital Stay: Admitting: Oncology

## 2024-09-07 ENCOUNTER — Inpatient Hospital Stay

## 2024-09-28 ENCOUNTER — Inpatient Hospital Stay

## 2024-09-28 ENCOUNTER — Inpatient Hospital Stay: Admitting: Oncology
